# Patient Record
Sex: Female | Born: 1960
Health system: Southern US, Community
[De-identification: ages and names within clinical notes are randomized; demographics above are authoritative.]

## PROBLEM LIST (undated history)

## (undated) DIAGNOSIS — I10 Essential (primary) hypertension: Secondary | ICD-10-CM

## (undated) DIAGNOSIS — M1990 Unspecified osteoarthritis, unspecified site: Secondary | ICD-10-CM

## (undated) DIAGNOSIS — J45909 Unspecified asthma, uncomplicated: Secondary | ICD-10-CM

## (undated) DIAGNOSIS — T7840XA Allergy, unspecified, initial encounter: Secondary | ICD-10-CM

## (undated) DIAGNOSIS — J302 Other seasonal allergic rhinitis: Secondary | ICD-10-CM

## (undated) DIAGNOSIS — E785 Hyperlipidemia, unspecified: Secondary | ICD-10-CM

## (undated) HISTORY — PX: WISDOM TOOTH EXTRACTION: SHX21

## (undated) HISTORY — PX: COLONOSCOPY: SHX174

## (undated) HISTORY — DX: Other seasonal allergic rhinitis: J30.2

## (undated) HISTORY — DX: Hyperlipidemia, unspecified: E78.5

## (undated) HISTORY — DX: Unspecified asthma, uncomplicated: J45.909

## (undated) HISTORY — DX: Allergy, unspecified, initial encounter: T78.40XA

---
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
## 2007-03-09 HISTORY — PX: COLONOSCOPY WITH ESOPHAGOGASTRODUODENOSCOPY (EGD): SHX5779

## 2008-01-29 ENCOUNTER — Emergency Department (HOSPITAL_COMMUNITY): Admission: EM | Admit: 2008-01-29 | Discharge: 2008-01-29 | Payer: Self-pay | Admitting: Emergency Medicine

## 2008-11-04 ENCOUNTER — Emergency Department (HOSPITAL_COMMUNITY): Admission: EM | Admit: 2008-11-04 | Discharge: 2008-11-04 | Payer: Self-pay | Admitting: Emergency Medicine

## 2010-09-30 ENCOUNTER — Emergency Department (HOSPITAL_COMMUNITY): Payer: 59

## 2010-09-30 ENCOUNTER — Observation Stay (HOSPITAL_COMMUNITY)
Admission: EM | Admit: 2010-09-30 | Discharge: 2010-10-01 | Disposition: A | Discharge status: Home / Self Care | Payer: 59 | Attending: Emergency Medicine | Admitting: Emergency Medicine

## 2010-09-30 DIAGNOSIS — R079 Chest pain, unspecified: Principal | ICD-10-CM | POA: Insufficient documentation

## 2010-09-30 LAB — CBC
HCT: 38.5 % (ref 36.0–46.0)
Hemoglobin: 13 g/dL (ref 12.0–15.0)
MCV: 82.4 fL (ref 78.0–100.0)
RBC: 4.67 MIL/uL (ref 3.87–5.11)
RDW: 16.7 % — ABNORMAL HIGH (ref 11.5–15.5)
WBC: 8.2 10*3/uL (ref 4.0–10.5)

## 2010-09-30 LAB — URINALYSIS, ROUTINE W REFLEX MICROSCOPIC
Leukocytes, UA: NEGATIVE
Nitrite: NEGATIVE
Protein, ur: NEGATIVE mg/dL
Specific Gravity, Urine: 1.009 (ref 1.005–1.030)
Urobilinogen, UA: 1 mg/dL (ref 0.0–1.0)

## 2010-09-30 LAB — DIFFERENTIAL
Eosinophils Relative: 8 % — ABNORMAL HIGH (ref 0–5)
Lymphocytes Relative: 28 % (ref 12–46)
Lymphs Abs: 2.3 10*3/uL (ref 0.7–4.0)
Neutro Abs: 4.3 10*3/uL (ref 1.7–7.7)

## 2010-09-30 LAB — POCT CARDIAC MARKERS
CKMB, poc: <1 ng/mL — ABNORMAL LOW (ref 1.0–8.0)
CKMB, poc: <1 ng/mL — ABNORMAL LOW (ref 1.0–8.0)
Troponin i, poc: <0.05 ng/mL (ref 0.00–0.09)
Troponin i, poc: <0.05 ng/mL (ref 0.00–0.09)

## 2010-09-30 LAB — POCT I-STAT, CHEM 8
Chloride: 107 mEq/L (ref 96–112)
Creatinine, Ser: 1.2 mg/dL (ref 0.4–1.2)
Glucose, Bld: 91 mg/dL (ref 70–99)
HCT: 40 % (ref 36.0–46.0)
Potassium: 4.1 mEq/L (ref 3.5–5.1)

## 2010-09-30 LAB — URINE MICROSCOPIC-ADD ON

## 2010-10-01 DIAGNOSIS — R072 Precordial pain: Secondary | ICD-10-CM

## 2010-10-17 LAB — BASIC METABOLIC PANEL
Chloride: 106 mEq/L (ref 96–112)
GFR calc Af Amer: >60 mL/min (ref >60)
GFR calc non Af Amer: >60 mL/min (ref >60)
Potassium: 3.6 mEq/L (ref 3.5–5.1)

## 2010-10-17 LAB — RAPID STREP SCREEN (MED CTR MEBANE ONLY): Streptococcus, Group A Screen (Direct): NEGATIVE

## 2011-04-05 LAB — POCT I-STAT, CHEM 8
BUN: 12
Chloride: 108
HCT: 37
Sodium: 142
TCO2: 27

## 2012-06-22 ENCOUNTER — Emergency Department (HOSPITAL_COMMUNITY): Payer: Self-pay

## 2012-06-22 ENCOUNTER — Encounter (HOSPITAL_COMMUNITY): Payer: Self-pay | Admitting: *Deleted

## 2012-06-22 ENCOUNTER — Emergency Department (HOSPITAL_COMMUNITY)
Admission: EM | Admit: 2012-06-22 | Discharge: 2012-06-22 | Disposition: A | Discharge status: Home / Self Care | Payer: Self-pay | Attending: Emergency Medicine | Admitting: Emergency Medicine

## 2012-06-22 DIAGNOSIS — M5412 Radiculopathy, cervical region: Secondary | ICD-10-CM | POA: Insufficient documentation

## 2012-06-22 DIAGNOSIS — I10 Essential (primary) hypertension: Secondary | ICD-10-CM | POA: Insufficient documentation

## 2012-06-22 DIAGNOSIS — Z79899 Other long term (current) drug therapy: Secondary | ICD-10-CM | POA: Insufficient documentation

## 2012-06-22 DIAGNOSIS — F17200 Nicotine dependence, unspecified, uncomplicated: Secondary | ICD-10-CM | POA: Insufficient documentation

## 2012-06-22 HISTORY — DX: Essential (primary) hypertension: I10

## 2012-06-22 MED ORDER — CYCLOBENZAPRINE HCL 10 MG PO TABS: 10.0000 mg | ORAL_TABLET | Freq: Three times a day (TID) | ORAL | Status: DC | PRN

## 2012-06-22 MED ORDER — PREDNISONE 20 MG PO TABS
60.0000 mg | ORAL_TABLET | Freq: Once | ORAL | Status: AC
  Administered 2012-06-22: 60 mg via ORAL
  Filled 2012-06-22: qty 3

## 2012-06-22 MED ORDER — PREDNISONE 20 MG PO TABS: ORAL_TABLET | ORAL | Status: DC

## 2012-06-22 MED ORDER — TRAMADOL-ACETAMINOPHEN 37.5-325 MG PO TABS: ORAL_TABLET | ORAL | Status: DC

## 2012-06-22 MED ORDER — CYCLOBENZAPRINE HCL 10 MG PO TABS
10.0000 mg | ORAL_TABLET | Freq: Once | ORAL | Status: AC
  Administered 2012-06-22: 10 mg via ORAL
  Filled 2012-06-22: qty 1

## 2012-06-22 NOTE — ED Provider Notes (Signed)
History   This chart was scribed for Ward Givens, MD by Gerlean Ren, ED Scribe. This patient was seen in room TR08C/TR08C and the patient's care was started at 6:32 PM    CSN: [id number]  Arrival date & time 06/22/12  1557   First MD Initiated Contact with Patient 06/22/12 1828      Chief Complaint  Patient presents with  . Arm Pain     The history is provided by the patient. No language interpreter was used.   Dajae Kizer is a 51 y.o. female with h/o HTN who presents to the Emergency Department complaining of shooting right shoulder pain associated with right middle PIP joint "snapping" first noticed 2.5-3 months ago that began causing shooting pain up right forearm 2 weeks later and gradually radiated up to right shoulder.  Pt currently states that there is no pain in finger but that right middle finger occasionally tingles.  Pt denies true numbness in right hand and fingers.  Pt states current pain is in right shoulder and was especially painful today.  She denies any prior injury  Pt has not seen PCP for problem.  Pt is a current everyday smoker but denies alcohol use.   PCP is Dr. Derrell Lolling in [place], [place]  Past Medical History  Diagnosis Date  . Hypertension     Past Surgical History  Procedure Date  . Cesarean section     No family history on file.  History  Substance Use Topics  . Smoking status: Current Every Day Smoker  . Smokeless tobacco: Not on file  . Alcohol Use: No  Lives at home unemployed    No OB history provided.   Review of Systems  HENT: Negative for neck pain.   Skin: Negative for wound.  Neurological: Negative for weakness and numbness.  All other systems reviewed and are negative.    Allergies  Review of patient's allergies indicates no known allergies.  Home Medications   Current Outpatient Rx  Name  Route  Sig  Dispense  Refill  . AMLODIPINE BESYLATE 10 MG PO TABS   Oral   Take 10 mg by mouth daily.         .  CHLORTHALIDONE 25 MG PO TABS   Oral   Take 25 mg by mouth daily.         Marland Kitchen LISINOPRIL 40 MG PO TABS   Oral   Take 40 mg by mouth daily.         . ADULT MULTIVITAMIN W/MINERALS CH   Oral   Take 1 tablet by mouth daily.           BP 127/90  Pulse 81  Temp 98.4 F (36.9 C) (Oral)  Resp 18  SpO2 97%  Vital signs normal    Physical Exam  Nursing note and vitals reviewed. Constitutional: She is oriented to person, place, and time. She appears well-developed and well-nourished.  Non-toxic appearance. She does not appear ill. No distress.  HENT:  Head: Normocephalic and atraumatic.  Right Ear: External ear normal.  Left Ear: External ear normal.  Nose: Nose normal. No mucosal edema or rhinorrhea.  Mouth/Throat: Mucous membranes are normal. No dental abscesses or uvula swelling.  Eyes: Conjunctivae normal and EOM are normal.  Neck: Normal range of motion and full passive range of motion without pain. Neck supple.  Cardiovascular: Exam reveals no friction rub.   Pulmonary/Chest: Effort normal. No respiratory distress. She has no rhonchi. She exhibits no crepitus.  Abdominal: Normal appearance.  Musculoskeletal: Normal range of motion. She exhibits no edema.       Non-tender cervical spine, right trapezius very tender, pain in proximal forearm with dorsiflexion of wrist, no pain with supination and pronation, no joint effusion in right elbow.  Right middle PIP joint appears mildly swollen but has good ROM.   Neurological: She is alert and oriented to person, place, and time. She has normal strength. No cranial nerve deficit.  Skin: Skin is warm, dry and intact. No rash noted. No erythema. No pallor.  Psychiatric: She has a normal mood and affect. Her speech is normal and behavior is normal. Her mood appears not anxious.    ED Course  Procedures (including critical care time)   Medications  traMADol-acetaminophen (ULTRACET) 37.5-325 MG per tablet (not administered)   predniSONE (DELTASONE) tablet 60 mg (60 mg Oral Given 06/22/12 1856)  cyclobenzaprine (FLEXERIL) tablet 10 mg (10 mg Oral Given 06/22/12 1856)    DIAGNOSTIC STUDIES: Oxygen Saturation is 97% on room air, adequate by my interpretation.    COORDINATION OF CARE: 6:41 PM- Patient informed of clinical course, understands medical decision-making process, and agrees with plan.     Dg Cervical Spine Complete  06/22/2012  *RADIOLOGY REPORT*  Clinical Data: Right neck and arm pain for the past 2 months.  No known injury.  CERVICAL SPINE - COMPLETE 4+ VIEW  Comparison: None.  Findings: Mild reversal of the normal cervical lordosis.  Mild facet degenerative changes at multiple levels.  Mild uncinate spur formation at multiple levels with mild foraminal stenosis on the left at the C5-6 level and mild to moderate foraminal stenosis on the left at the C6-7 level.  There is also mild foraminal stenosis on the right at the C4-5, C5-6 and C6-7 levels.  There is anterior and posterior spur formation at the C5-6 and C6-7 levels with moderate disc space narrowing at the C6-7 level and mild retrolisthesis at the level.  IMPRESSION: Degenerative changes, as described above.   Original Report Authenticated By: Beckie Salts, M.D.    Dg Finger Middle Right  06/22/2012  *RADIOLOGY REPORT*  Clinical Data: Right middle finger pain and swelling for the past 2 months.  No reported injury.  RIGHT MIDDLE FINGER 2+V  Comparison: None.  Findings: Three views of the right middle finger demonstrate normal appearing bones and soft tissues.  IMPRESSION: Normal examination.   Original Report Authenticated By: Beckie Salts, M.D.      1. Cervical radiculopathy      New Prescriptions   CYCLOBENZAPRINE (FLEXERIL) 10 MG TABLET    Take 1 tablet (10 mg total) by mouth 3 (three) times daily as needed for muscle spasms.   PREDNISONE (DELTASONE) 20 MG TABLET    Take 3 po QD x 2d starting tomorrow, then 2 po QD x 3d then 1 po QD x 3d    TRAMADOL-ACETAMINOPHEN (ULTRACET) 37.5-325 MG PER TABLET    2 tabs po QID prn pain    Plan discharge  Devoria Albe, MD, FACEP    MDM   I personally performed the services described in this documentation, which was scribed in my presence. The recorded information has been reviewed and considered.         Ward Givens, MD 06/22/12 2113

## 2012-06-22 NOTE — ED Notes (Signed)
MD at bedside. 

## 2012-06-22 NOTE — ED Notes (Signed)
Pt started having right middle finger pain and then radiated up her right forearm.  Pt was wearing this brace and now pain shoots up to shoulder blade

## 2012-08-25 ENCOUNTER — Emergency Department (HOSPITAL_COMMUNITY)
Admission: EM | Admit: 2012-08-25 | Discharge: 2012-08-25 | Disposition: A | Discharge status: Home / Self Care | Payer: Self-pay | Source: Home / Self Care | Attending: Family Medicine | Admitting: Family Medicine

## 2012-08-25 ENCOUNTER — Encounter (HOSPITAL_COMMUNITY): Payer: Self-pay

## 2012-08-25 DIAGNOSIS — F17200 Nicotine dependence, unspecified, uncomplicated: Secondary | ICD-10-CM

## 2012-08-25 DIAGNOSIS — I10 Essential (primary) hypertension: Secondary | ICD-10-CM

## 2012-08-25 MED ORDER — AMLODIPINE BESYLATE 10 MG PO TABS: 10.0000 mg | ORAL_TABLET | Freq: Every day | ORAL | Status: DC

## 2012-08-25 MED ORDER — CHLORTHALIDONE 25 MG PO TABS: 25.0000 mg | ORAL_TABLET | Freq: Every day | ORAL | Status: DC

## 2012-08-25 MED ORDER — CLONIDINE HCL 0.1 MG PO TABS
0.2000 mg | ORAL_TABLET | Freq: Once | ORAL | Status: AC
  Administered 2012-08-25: 0.2 mg via ORAL

## 2012-08-25 MED ORDER — LISINOPRIL 40 MG PO TABS: 40.0000 mg | ORAL_TABLET | Freq: Every day | ORAL | Status: DC

## 2012-08-25 MED ORDER — CLONIDINE HCL 0.1 MG PO TABS
ORAL_TABLET | ORAL | Status: AC
  Filled 2012-08-25: qty 2

## 2012-08-25 NOTE — ED Provider Notes (Signed)
History     CSN: [id number]  Arrival date & time 08/25/12  1138   First MD Initiated Contact with Patient 08/25/12 1201      Chief Complaint  Patient presents with  . Hypertension  . Arthritis   HPI Pt says that she has been out of her BP meds for over 1 week.  She is reporting that she sees a family doctor in [place] and had blood work done recently.  She is reporting that she needs to have her regular BP meds filled and it has been difficult for her to go to her PCP in [facility] because of not having medical insurance and because of the distance with traveling.  The patient denies chest pain and shortness of breath.  The patient denies headaches and weakness in the extremities.  She reports that her blood pressures are usually controlled when she is able to take the medication regularly.  Past Medical History  Diagnosis Date  . Hypertension     Past Surgical History  Procedure Laterality Date  . Cesarean section      No family history on file.  History  Substance Use Topics  . Smoking status: Current Every Day Smoker  . Smokeless tobacco: Not on file  . Alcohol Use: No    OB History   Grav Para Term Preterm Abortions TAB SAB Ect Mult Living                  Review of Systems  Constitutional: Positive for fatigue.  HENT: Positive for congestion.   Musculoskeletal: Positive for arthralgias.  Psychiatric/Behavioral: Positive for sleep disturbance.  All other systems reviewed and are negative.    Allergies  Review of patient's allergies indicates no known allergies.  Home Medications   Current Outpatient Rx  Name  Route  Sig  Dispense  Refill  . amLODipine (NORVASC) 10 MG tablet   Oral   Take 10 mg by mouth daily.         . chlorthalidone (HYGROTON) 25 MG tablet   Oral   Take 25 mg by mouth daily.         . cyclobenzaprine (FLEXERIL) 10 MG tablet   Oral   Take 1 tablet (10 mg total) by mouth 3 (three) times daily as needed for muscle spasms.  30 tablet   0   . lisinopril (PRINIVIL,ZESTRIL) 40 MG tablet   Oral   Take 40 mg by mouth daily.         . Multiple Vitamin (MULTIVITAMIN WITH MINERALS) TABS   Oral   Take 1 tablet by mouth daily.         . predniSONE (DELTASONE) 20 MG tablet      Take 3 po QD x 2d starting tomorrow, then 2 po QD x 3d then 1 po QD x 3d   15 tablet   0   . traMADol-acetaminophen (ULTRACET) 37.5-325 MG per tablet      2 tabs po QID prn pain   20 tablet   0     BP 195/101  Pulse 92  Temp(Src) 98.3 F (36.8 C) (Oral)  Physical Exam  Nursing note and vitals reviewed. Constitutional: She is oriented to person, place, and time. She appears well-developed and well-nourished. No distress.  HENT:  Head: Normocephalic and atraumatic.  Right Ear: External ear normal.  Left Ear: External ear normal.  Nose: Mucosal edema present.  Eyes: Conjunctivae and EOM are normal. Pupils are equal, round, and reactive to light.  Neck: Normal range of motion. Neck supple. No JVD present. No tracheal deviation present. No thyromegaly present.  Cardiovascular: Normal rate, regular rhythm and normal heart sounds.  Exam reveals no gallop and no friction rub.   No murmur heard. Pulmonary/Chest: Effort normal and breath sounds normal. No respiratory distress. She has no wheezes. She has no rales. She exhibits no tenderness.  Abdominal: Soft. Bowel sounds are normal. She exhibits no distension and no mass. There is no tenderness. There is no rebound and no guarding.  Musculoskeletal: Normal range of motion. She exhibits no edema and no tenderness.  Lymphadenopathy:    She has no cervical adenopathy.  Neurological: She is alert and oriented to person, place, and time. No cranial nerve deficit. She exhibits normal muscle tone. Coordination normal.  Skin: Skin is warm and dry. No rash noted. No erythema. No pallor.  Psychiatric: She has a normal mood and affect. Her behavior is normal. Judgment and thought content  normal.    ED Course  Procedures (including critical care time)  Labs Reviewed - No data to display No results found.   No diagnosis found.    MDM  IMPRESSION  Hypertension  Low back pain  Osteoarthritis    RECOMMENDATIONS / PLAN Clonidine 0.2 mg po given in office, Bp was rechecked and starting to improve significantly Prescriptions were refilled and sent electronically to [facility] as pt requested Pt says she would go to get her prescriptions filled today Pt says that she would return to have her BP rechecked in 2 weeks Pt says she had labs done with her PCP in [place] recently - will request to get those records Pt was given instructions to go to ER if her symptoms returned or worsened or new problems developed Pt will call to get a referral to sports medicine regarding her lower back and shoulder as she was not able to follow up with orthopedics when she was seen in ER because of not having insurance and not having money to pay upfront for an office visit. Dental referral when patient gets her orange card Check labs on follow up if we have not received old records DASH Diet information given and low sodium recommended  FOLLOW UP 2 weeks to follow up BP  The patient was given clear instructions to go to ER or return to medical center if symptoms don't improve, worsen or new problems develop.  The patient verbalized understanding.  The patient was told to call to get lab results if they haven't heard anything in the next week.            Cleora Fleet, MD 08/25/12 1454

## 2012-08-25 NOTE — ED Notes (Signed)
Patient states has a history of HTN , arthritis Complains last two days has swelling in face, hands and ankles

## 2012-08-26 MED ORDER — CHLORTHALIDONE 25 MG PO TABS: 25.0000 mg | ORAL_TABLET | Freq: Every day | ORAL | Status: DC

## 2012-08-26 MED ORDER — LISINOPRIL 40 MG PO TABS: 40.0000 mg | ORAL_TABLET | Freq: Every day | ORAL | Status: DC

## 2012-08-26 MED ORDER — AMLODIPINE BESYLATE 10 MG PO TABS: 10.0000 mg | ORAL_TABLET | Freq: Every day | ORAL | Status: DC

## 2012-09-09 ENCOUNTER — Emergency Department (HOSPITAL_COMMUNITY)
Admission: EM | Admit: 2012-09-09 | Discharge: 2012-09-09 | Disposition: A | Discharge status: Home / Self Care | Payer: No Typology Code available for payment source | Source: Home / Self Care

## 2012-09-09 ENCOUNTER — Encounter (HOSPITAL_COMMUNITY): Payer: Self-pay | Admitting: *Deleted

## 2012-09-09 DIAGNOSIS — I10 Essential (primary) hypertension: Secondary | ICD-10-CM

## 2012-09-09 MED ORDER — LISINOPRIL 40 MG PO TABS: 40.0000 mg | ORAL_TABLET | Freq: Every day | ORAL | Status: DC

## 2012-09-09 MED ORDER — TRAMADOL-ACETAMINOPHEN 37.5-325 MG PO TABS: ORAL_TABLET | ORAL | Status: DC

## 2012-09-09 MED ORDER — CYCLOBENZAPRINE HCL 10 MG PO TABS: 10.0000 mg | ORAL_TABLET | Freq: Three times a day (TID) | ORAL | Status: DC | PRN

## 2012-09-09 MED ORDER — CHLORTHALIDONE 25 MG PO TABS: 25.0000 mg | ORAL_TABLET | Freq: Every day | ORAL | Status: DC

## 2012-09-09 MED ORDER — ADULT MULTIVITAMIN W/MINERALS CH: 1.0000 | ORAL_TABLET | Freq: Every day | ORAL | Status: DC

## 2012-09-09 MED ORDER — AMLODIPINE BESYLATE 10 MG PO TABS: 10.0000 mg | ORAL_TABLET | Freq: Every day | ORAL | Status: DC

## 2012-09-09 MED ORDER — METOPROLOL TARTRATE 50 MG PO TABS: 25.0000 mg | ORAL_TABLET | Freq: Two times a day (BID) | ORAL | Status: DC

## 2012-09-09 NOTE — ED Notes (Signed)
Patient states she is here for follow up to hypertension and states she is also here for new problem of back pian due to arthritis.

## 2012-09-09 NOTE — ED Provider Notes (Signed)
History     CSN: [id number]  Arrival date & time 09/09/12  1010   First MD Initiated Contact with Patient 09/09/12 1012      Chief Complaint  Patient presents with  . Follow-up  . Arthritis    Patient with history of hypertension and chronic neck pain due to C-spine DJD comes to the office for blood pressure monitoring along with referral to neurosurgeon if possible. She says that she has this chronic pain in her C-spine radiating down to her right shoulder which is mild to moderate in nature and persistent, no associated symptoms worse with activity better with rest no weakness in the right arm. No fever chills no injuries to the C-spine. A C-spine x-ray done in the ER few weeks ago does reveal C-spine DJD with some C-spine stenosis between C6 and C7.   HPI  Past Medical History  Diagnosis Date  . Hypertension     Past Surgical History  Procedure Laterality Date  . Cesarean section      No family history on file.  History  Substance Use Topics  . Smoking status: Current Every Day Smoker  . Smokeless tobacco: Not on file  . Alcohol Use: No    OB History   Grav Para Term Preterm Abortions TAB SAB Ect Mult Living                  Review of Systems  Allergies  Review of patient's allergies indicates no known allergies.  Home Medications   Current Outpatient Rx  Name  Route  Sig  Dispense  Refill  . amLODipine (NORVASC) 10 MG tablet   Oral   Take 1 tablet (10 mg total) by mouth daily.   30 tablet   2   . chlorthalidone (HYGROTON) 25 MG tablet   Oral   Take 1 tablet (25 mg total) by mouth daily.   30 tablet   2   . cyclobenzaprine (FLEXERIL) 10 MG tablet   Oral   Take 1 tablet (10 mg total) by mouth 3 (three) times daily as needed for muscle spasms.   30 tablet   0   . lisinopril (PRINIVIL,ZESTRIL) 40 MG tablet   Oral   Take 1 tablet (40 mg total) by mouth daily.   30 tablet   2   . metoprolol (LOPRESSOR) 50 MG tablet   Oral   Take 0.5 tablets  (25 mg total) by mouth 2 (two) times daily.   60 tablet   1   . Multiple Vitamin (MULTIVITAMIN WITH MINERALS) TABS   Oral   Take 1 tablet by mouth daily.   30 tablet   3   . predniSONE (DELTASONE) 20 MG tablet      Take 3 po QD x 2d starting tomorrow, then 2 po QD x 3d then 1 po QD x 3d   15 tablet   0   . traMADol-acetaminophen (ULTRACET) 37.5-325 MG per tablet      2 tabs po QID prn pain   20 tablet   0     BP 144/96  Pulse 85  Temp(Src) 98.1 F (36.7 C) (Oral)  Resp 14  SpO2 98%  Physical Exam  Awake alert C-spine range of motion is good, no pain or tenderness on palpation, no stepping deformity, right shoulder range of motion both active and passive pain-free. Good range of motion. Good right arm strength 5 x 5. Intact sensation which is symmetrical in both arms.  Chest wall movement  symmetrical breath sounds bilaterally S1-S2 no murmurs  ED Course  Procedures (including critical care time)  Labs Reviewed - No data to display No results found.   1. HTN (hypertension)    Blood pressure noted, previous medications will be continued Will add low-dose Lopressor better control and have her come back in a month.   2. Chronic neck pain due to mild degenerative disc disease in C-spine along with some C6-C7 spinal narrowing of/stenosis - continue tramadol, will refer her to neurosurgeon to see if any further interventions needed, although I highly doubt that she would benefit from any surgical procedure at this point          Leroy Sea, MD 09/09/12 1052

## 2012-09-11 NOTE — ED Notes (Signed)
Referral faxed to ophthamology waiting for an appt

## 2013-02-09 ENCOUNTER — Ambulatory Visit: Payer: No Typology Code available for payment source | Attending: Family Medicine | Admitting: Internal Medicine

## 2013-02-09 VITALS — BP 199/123 | HR 66 | Temp 97.8°F | Resp 18 | Wt 132.2 lb

## 2013-02-09 DIAGNOSIS — M2550 Pain in unspecified joint: Secondary | ICD-10-CM

## 2013-02-09 DIAGNOSIS — I10 Essential (primary) hypertension: Secondary | ICD-10-CM

## 2013-02-09 LAB — CBC WITH DIFFERENTIAL/PLATELET
Basophils Absolute: 0.1 10*3/uL (ref 0.0–0.1)
Eosinophils Relative: 7 % — ABNORMAL HIGH (ref 0–5)
Lymphocytes Relative: 24 % (ref 12–46)
Lymphs Abs: 2.1 10*3/uL (ref 0.7–4.0)
MCV: 85.4 fL (ref 78.0–100.0)
Neutro Abs: 5.2 10*3/uL (ref 1.7–7.7)
Platelets: 260 10*3/uL (ref 150–400)
RBC: 5.14 MIL/uL — ABNORMAL HIGH (ref 3.87–5.11)
RDW: 13 % (ref 11.5–15.5)
WBC: 8.7 10*3/uL (ref 4.0–10.5)

## 2013-02-09 MED ORDER — TRAMADOL-ACETAMINOPHEN 37.5-325 MG PO TABS: ORAL_TABLET | ORAL | Status: DC

## 2013-02-09 MED ORDER — CLONIDINE HCL 0.1 MG PO TABS
0.2000 mg | ORAL_TABLET | Freq: Once | ORAL | Status: AC
  Administered 2013-02-09: 0.2 mg via ORAL

## 2013-02-09 MED ORDER — CHLORTHALIDONE 25 MG PO TABS: 25.0000 mg | ORAL_TABLET | Freq: Every day | ORAL | Status: DC

## 2013-02-09 MED ORDER — CYCLOBENZAPRINE HCL 10 MG PO TABS: 10.0000 mg | ORAL_TABLET | Freq: Three times a day (TID) | ORAL | Status: DC | PRN

## 2013-02-09 MED ORDER — LISINOPRIL 40 MG PO TABS: 40.0000 mg | ORAL_TABLET | Freq: Every day | ORAL | Status: DC

## 2013-02-09 MED ORDER — METOPROLOL TARTRATE 50 MG PO TABS: 25.0000 mg | ORAL_TABLET | Freq: Two times a day (BID) | ORAL | Status: DC

## 2013-02-09 MED ORDER — AMLODIPINE BESYLATE 10 MG PO TABS: 10.0000 mg | ORAL_TABLET | Freq: Every day | ORAL | Status: DC

## 2013-02-09 MED ORDER — CLONIDINE HCL 0.2 MG PO TABS: 0.2000 mg | ORAL_TABLET | Freq: Once | ORAL | Status: DC

## 2013-02-09 NOTE — Progress Notes (Signed)
BLOOD PRESSURE RECHECK 199/123 POST MEDICATION. DR. Jerilynn Birkenhead.

## 2013-02-09 NOTE — Progress Notes (Signed)
Patient ID: Lori Bush, female   DOB: [birth year], 52 y.o.   MRN: [id number]  CC:  HPI: 52 year old female with a history of hypertension who is here for followup her blood pressures greater than 200 systolic. The patient states that she has not been taking her lisinopril for the last 2-3 days. She was asked to come in and have blood work done before he felt could be prescribed. The patient complains of morning stiffness in her arms and in her hands for at least one hour when she wakes up. The patient also has chronic C-spine pain radiating into her right shoulder. She has a strong family history of rheumatoid arthritis  No Known Allergies Past Medical History  Diagnosis Date  . Hypertension    Current Outpatient Prescriptions on File Prior to Visit  Medication Sig Dispense Refill  . Multiple Vitamin (MULTIVITAMIN WITH MINERALS) TABS Take 1 tablet by mouth daily.  30 tablet  3  . predniSONE (DELTASONE) 20 MG tablet Take 3 po QD x 2d starting tomorrow, then 2 po QD x 3d then 1 po QD x 3d  15 tablet  0   No current facility-administered medications on file prior to visit.   History reviewed. No pertinent family history. History   Social History  . Marital Status: Single    Spouse Name: N/A    Number of Children: N/A  . Years of Education: N/A   Occupational History  . Not on file.   Social History Main Topics  . Smoking status: Current Every Day Smoker  . Smokeless tobacco: Not on file  . Alcohol Use: No  . Drug Use: No  . Sexually Active:    Other Topics Concern  . Not on file   Social History Narrative  . No narrative on file    Review of Systems  Constitutional: Negative for fever, chills, diaphoresis, activity change, appetite change and fatigue.  HENT: Negative for ear pain, nosebleeds, congestion, facial swelling, rhinorrhea, neck pain, neck stiffness and ear discharge.   Eyes: Negative for pain, discharge, redness, itching and visual disturbance.   Respiratory: Negative for cough, choking, chest tightness, shortness of breath, wheezing and stridor.   Cardiovascular: Negative for chest pain, palpitations and leg swelling.  Gastrointestinal: Negative for abdominal distention.  Genitourinary: Negative for dysuria, urgency, frequency, hematuria, flank pain, decreased urine volume, difficulty urinating and dyspareunia.  Musculoskeletal: Negative for back pain, joint swelling, arthralgias and gait problem.  Neurological: Negative for dizziness, tremors, seizures, syncope, facial asymmetry, speech difficulty, weakness, light-headedness, numbness and headaches.  Hematological: Negative for adenopathy. Does not bruise/bleed easily.  Psychiatric/Behavioral: Negative for hallucinations, behavioral problems, confusion, dysphoric mood, decreased concentration and agitation.    Objective:   Filed Vitals:   02/09/13 1654  BP: 213/119  Pulse: 66  Temp: 97.8 F (36.6 C)  Resp: 18    Physical Exam  Constitutional: Appears well-developed and well-nourished. No distress.  HENT: Normocephalic. External right and left ear normal. Oropharynx is clear and moist.  Eyes: Conjunctivae and EOM are normal. PERRLA, no scleral icterus.  Neck: Normal ROM. Neck supple. No JVD. No tracheal deviation. No thyromegaly.  CVS: RRR, S1/S2 +, no murmurs, no gallops, no carotid bruit.  Pulmonary: Effort and breath sounds normal, no stridor, rhonchi, wheezes, rales.  Abdominal: Soft. BS +,  no distension, tenderness, rebound or guarding.  Musculoskeletal: Normal range of motion. No edema and no tenderness.  Lymphadenopathy: No lymphadenopathy noted, cervical, inguinal. Neuro: Alert. Normal reflexes, muscle tone coordination. No cranial nerve  deficit. Skin: Skin is warm and dry. No rash noted. Not diaphoretic. No erythema. No pallor.  Psychiatric: Normal mood and affect. Behavior, judgment, thought content normal.   Lab Results  Component Value Date   WBC 8.2  09/30/2010   HGB 13.6 09/30/2010   HCT 40.0 09/30/2010   MCV 82.4 09/30/2010   PLT 318 09/30/2010   Lab Results  Component Value Date   CREATININE 1.2 09/30/2010   BUN 22 09/30/2010   NA 139 09/30/2010   K 4.1 09/30/2010   CL 107 09/30/2010   CO2 25 11/04/2008    No results found for this basename: HGBA1C   Lipid Panel  No results found for this basename: chol, trig, hdl, cholhdl, vldl, ldlcalc       Assessment and plan:   There are no active problems to display for this patient.  Hypertension Refill all medications Obtain BMP panel today Patient asked to followup in 3 days for a blood pressure check  Probable rheumatoid arthritis with a strong family history we'll check rheumatoid factor and anti-CCP antibody refill tramadol

## 2013-02-09 NOTE — Progress Notes (Signed)
PT GIVEN 0.2 MG CLONIDINE PO FOR ELEVATED BP. WILL  RECHECK IN 25 MIN

## 2013-02-09 NOTE — Progress Notes (Signed)
Patient states here for medication refill Also complains of her hands cramping up

## 2013-02-10 LAB — SEDIMENTATION RATE: Sed Rate: 4 mm/hr (ref 0–22)

## 2013-02-10 LAB — COMPREHENSIVE METABOLIC PANEL
ALT: 26 U/L (ref 0–35)
AST: 27 U/L (ref 0–37)
Albumin: 3.9 g/dL (ref 3.5–5.2)
CO2: 26 mEq/L (ref 19–32)
Calcium: 10.6 mg/dL — ABNORMAL HIGH (ref 8.4–10.5)
Chloride: 109 mEq/L (ref 96–112)
Potassium: 3.9 mEq/L (ref 3.5–5.3)
Sodium: 141 mEq/L (ref 135–145)
Total Protein: 6.6 g/dL (ref 6.0–8.3)

## 2013-02-10 LAB — LIPID PANEL
Cholesterol: 160 mg/dL (ref 0–200)
Total CHOL/HDL Ratio: 3.1 Ratio

## 2013-02-10 LAB — RHEUMATOID FACTOR: Rhuematoid fact SerPl-aCnc: <10 IU/mL (ref <=14)

## 2013-02-11 ENCOUNTER — Telehealth: Payer: Self-pay

## 2013-02-11 NOTE — Telephone Encounter (Signed)
Health dept called requesting some medication changes Changed ultracet to tramadol 50mg  Q6hour prm Changed chlorthaldone to  HCTZ 25mg  daily Metoprolol- 25 mg bid # 60 Per Dr Hyman Hopes

## 2013-02-12 ENCOUNTER — Ambulatory Visit: Payer: No Typology Code available for payment source | Admitting: Family Medicine

## 2013-02-23 ENCOUNTER — Ambulatory Visit: Payer: No Typology Code available for payment source

## 2013-03-05 ENCOUNTER — Ambulatory Visit: Payer: Self-pay | Attending: Family Medicine

## 2013-03-05 ENCOUNTER — Ambulatory Visit (HOSPITAL_BASED_OUTPATIENT_CLINIC_OR_DEPARTMENT_OTHER): Payer: Self-pay | Admitting: Internal Medicine

## 2013-03-05 VITALS — BP 164/92 | HR 70 | Temp 97.8°F | Resp 16 | Wt 130.0 lb

## 2013-03-05 DIAGNOSIS — I10 Essential (primary) hypertension: Secondary | ICD-10-CM | POA: Insufficient documentation

## 2013-03-05 MED ORDER — CLONIDINE HCL 0.2 MG PO TABS: 0.2000 mg | ORAL_TABLET | Freq: Two times a day (BID) | ORAL | Status: DC

## 2013-03-05 MED ORDER — METOPROLOL TARTRATE 50 MG PO TABS
50.0000 mg | ORAL_TABLET | Freq: Two times a day (BID) | ORAL | Status: DC
Start: 2013-03-05 — End: 2013-09-30

## 2013-03-05 NOTE — Progress Notes (Signed)
Patient ID: Lori Bush, female   DOB: Nov 29, 1960, 52 y.o.   MRN: [id number]  CC: Followup  HPI: Ms. Lori Bush is a 52 years old of an American woman here today for followup visit and to discuss the lab results. Blood pressure is elevated, then claims to be compliant with medication, apparently she is taking 0.2 mg clonidine once daily, and Lopressor 25 mg tablet twice a day. She is continue to smoke cigarette about one pack per day. Lab results were discussed, investigations negative for rheumatoid arthritis. Patient claims to still have some trigger fingers, especially the left thumb.  No leg swelling, no chest pain, denies headache, no tingling or numbness, no blurry vision.  No Known Allergies Past Medical History  Diagnosis Date  . Hypertension    Current Outpatient Prescriptions on File Prior to Visit  Medication Sig Dispense Refill  . amLODipine (NORVASC) 10 MG tablet Take 1 tablet (10 mg total) by mouth daily.  30 tablet  2  . chlorthalidone (HYGROTON) 25 MG tablet Take 1 tablet (25 mg total) by mouth daily.  30 tablet  2  . cyclobenzaprine (FLEXERIL) 10 MG tablet Take 1 tablet (10 mg total) by mouth 3 (three) times daily as needed for muscle spasms.  30 tablet  0  . hydrochlorothiazide (HYDRODIURIL) 25 MG tablet Take 25 mg by mouth daily.      Marland Kitchen lisinopril (PRINIVIL,ZESTRIL) 40 MG tablet Take 1 tablet (40 mg total) by mouth daily.  30 tablet  2  . Multiple Vitamin (MULTIVITAMIN WITH MINERALS) TABS Take 1 tablet by mouth daily.  30 tablet  3  . traMADol-acetaminophen (ULTRACET) 37.5-325 MG per tablet 2 tabs po QID prn pain  30 tablet  0   No current facility-administered medications on file prior to visit.   History reviewed. No pertinent family history. History   Social History  . Marital Status: Single    Spouse Name: N/A    Number of Children: N/A  . Years of Education: N/A   Occupational History  . Not on file.   Social History Main Topics  . Smoking status:  Current Every Day Smoker  . Smokeless tobacco: Not on file  . Alcohol Use: No  . Drug Use: No  . Sexual Activity:    Other Topics Concern  . Not on file   Social History Narrative  . No narrative on file    Review of Systems: Constitutional: Negative for fever, chills, diaphoresis, activity change, appetite change and fatigue. HENT: Negative for ear pain, nosebleeds, congestion, facial swelling, rhinorrhea, neck pain, neck stiffness and ear discharge.  Eyes: Negative for pain, discharge, redness, itching and visual disturbance. Respiratory: Negative for cough, choking, chest tightness, shortness of breath, wheezing and stridor.  Cardiovascular: Negative for chest pain, palpitations and leg swelling. Gastrointestinal: Negative for abdominal distention. Genitourinary: Negative for dysuria, urgency, frequency, hematuria, flank pain, decreased urine volume, difficulty urinating and dyspareunia.  Musculoskeletal: Negative for back pain, joint swelling, arthralgias and gait problem. Neurological: Negative for dizziness, tremors, seizures, syncope, facial asymmetry, speech difficulty, weakness, light-headedness, numbness and headaches.  Hematological: Negative for adenopathy. Does not bruise/bleed easily. Psychiatric/Behavioral: Negative for hallucinations, behavioral problems, confusion, dysphoric mood, decreased concentration and agitation.    Objective:   Filed Vitals:   03/05/13 1521  BP: 164/92  Pulse: 70  Temp:   Resp:     Physical Exam: Constitutional: Patient appears well-developed and well-nourished. No distress. HENT: Normocephalic, atraumatic, External right and left ear normal. Oropharynx is clear and moist.  Eyes: Conjunctivae and EOM are normal. PERRLA, no scleral icterus. Neck: Normal ROM. Neck supple. No JVD. No tracheal deviation. No thyromegaly. CVS: RRR, S1/S2 +, no murmurs, no gallops, no carotid bruit.  Pulmonary: Effort and breath sounds normal, no stridor,  rhonchi, wheezes, rales.  Abdominal: Soft. BS +,  no distension, tenderness, rebound or guarding.  Musculoskeletal: Normal range of motion. No edema and no tenderness.  Lymphadenopathy: No lymphadenopathy noted, cervical, inguinal or axillary Neuro: Alert. Normal reflexes, muscle tone coordination. No cranial nerve deficit. Skin: Skin is warm and dry. No rash noted. Not diaphoretic. No erythema. No pallor. Psychiatric: Normal mood and affect. Behavior, judgment, thought content normal.  Lab Results  Component Value Date   WBC 8.7 02/09/2013   HGB 15.2* 02/09/2013   HCT 43.9 02/09/2013   MCV 85.4 02/09/2013   PLT 260 02/09/2013   Lab Results  Component Value Date   CREATININE 1.02 02/09/2013   BUN 12 02/09/2013   NA 141 02/09/2013   K 3.9 02/09/2013   CL 109 02/09/2013   CO2 26 02/09/2013    No results found for this basename: HGBA1C   Lipid Panel     Component Value Date/Time   CHOL 160 02/09/2013 1719   TRIG 131 02/09/2013 1719   HDL 51 02/09/2013 1719   CHOLHDL 3.1 02/09/2013 1719   VLDL 26 02/09/2013 1719   LDLCALC 83 02/09/2013 1719       Assessment and plan:   Patient Active Problem List   Diagnosis Date Noted  . Accelerated hypertension 03/05/2013   Increase metoprolol to 50 mg tablet by mouth twice a day Increase clonidine tablet 0.2 mg by mouth twice a day Continue other medications as prescribed Patient counseled on blood pressure goals Extensive counseling on smoking cessation done Patient was counseled on medication compliance Nutritional and exercise counseling Return in one week for blood pressure check  Lori Bush was given clear instructions to go to ER or return to the clinic if symptoms don't improve, worsen or new problems develop.  Lori Bush verbalized understanding.  Lori Bush was told to call to get lab results if hasn't heard anything in the next week.        Jeanann Lewandowsky, MD [facility] [place], [place] [phone]   03/05/2013, 3:23 PM

## 2013-03-05 NOTE — Progress Notes (Signed)
Patient here for follow up- blood pressure Lab results  

## 2013-03-12 ENCOUNTER — Ambulatory Visit: Payer: Self-pay | Attending: Family Medicine | Admitting: Family Medicine

## 2013-05-12 ENCOUNTER — Ambulatory Visit: Payer: No Typology Code available for payment source | Attending: Internal Medicine | Admitting: Internal Medicine

## 2013-05-12 VITALS — BP 147/91 | HR 61 | Temp 98.0°F | Resp 16 | Ht 60.0 in | Wt 130.0 lb

## 2013-05-12 DIAGNOSIS — I10 Essential (primary) hypertension: Secondary | ICD-10-CM

## 2013-05-12 DIAGNOSIS — Z0000 Encounter for general adult medical examination without abnormal findings: Secondary | ICD-10-CM

## 2013-05-12 DIAGNOSIS — R682 Dry mouth, unspecified: Secondary | ICD-10-CM

## 2013-05-12 MED ORDER — CHLORHEXIDINE GLUCONATE 0.12 % MT SOLN: 15.0000 mL | Freq: Two times a day (BID) | OROMUCOSAL | Status: DC

## 2013-05-12 NOTE — Progress Notes (Unsigned)
Pt is here for a F/U visit Pt is here following up on her HTN. Pt is requesting a referral for a MM and a pap smear.

## 2013-05-12 NOTE — Progress Notes (Unsigned)
Patient ID: Lori Bush, female   DOB: 31-Oct-1960, 52 y.o.   MRN: [id number]   HPI: 52 y/o female here for f/u on HTN and mammogram.  States Clonidine makes her sleepy and she is taking it only at night.  Mouth has been dry and she is asking for a treatment for it.    No Known Allergies Past Medical History  Diagnosis Date  . Hypertension     History reviewed. No pertinent family history. History   Social History  . Marital Status: Single    Spouse Name: N/A    Number of Children: N/A  . Years of Education: N/A   Occupational History  . Not on file.   Social History Main Topics  . Smoking status: Current Every Day Smoker  . Smokeless tobacco: Not on file  . Alcohol Use: No  . Drug Use: No  . Sexual Activity:    Other Topics Concern  . Not on file   Social History Narrative  . No narrative on file    Review of Systems ______ Constitutional: Negative for fever, chills, diaphoresis, activity change, appetite change and fatigue. ____ HENT: Negative for ear pain, nosebleeds, congestion, facial swelling, rhinorrhea, neck pain, neck stiffness and ear discharge.  ____ Eyes: Negative for pain, discharge, redness, itching and visual disturbance. ____ Respiratory: Negative for cough, choking, chest tightness, shortness of breath, wheezing and stridor.  ____ Cardiovascular: Negative for chest pain, palpitations and leg swelling. ____ Gastrointestinal: Negative for Nausea/ Vomiting/ Diarrhea or Consitpation Genitourinary: Negative for dysuria, urgency, frequency, hematuria, flank pain, decreased urine volume, difficulty urinating and dyspareunia. ____ Musculoskeletal: Negative for back pain, joint swelling, arthralgias and gait problem. ________ Neurological: Negative for dizziness, tremors, seizures, syncope, facial asymmetry, speech difficulty, weakness, light-headedness, numbness and headaches. ____ Hematological: Negative for adenopathy. Does not bruise/bleed easily.  ____ Psychiatric/Behavioral: Negative for hallucinations, behavioral problems, confusion, dysphoric mood, decreased concentration and agitation. ______   Objective:   Filed Vitals:   05/12/13 1057  BP: 147/91  Pulse: 61  Temp: 98 F (36.7 C)  Resp: 16   Filed Weights   05/12/13 1057  Weight: 130 lb (58.968 kg)    Physical Exam ______ Constitutional: Appears well-developed and well-nourished. No distress. ____ HENT: Normocephalic. External right and left ear normal. Oropharynx is clear and moist. ____ Eyes: Conjunctivae and EOM are normal. PERRLA, no scleral icterus. ____ Neck: Normal ROM. Neck supple. No JVD. No tracheal deviation. No thyromegaly. ____ CVS: RRR, S1/S2 +, no murmurs, no gallops, no carotid bruit.  Pulmonary: Effort and breath sounds normal, no stridor, rhonchi, wheezes, rales.  Abdominal: Soft. BS +,  no distension, tenderness, rebound or guarding. ________ Musculoskeletal: Normal range of motion. No edema and no tenderness. ____ Neuro: Alert. Normal reflexes, muscle tone coordination. No cranial nerve deficit. Skin: Skin is warm and dry. No rash noted. Not diaphoretic. No erythema. No pallor. ____ Psychiatric: Normal mood and affect. Behavior, judgment, thought content normal. __  Lab Results  Component Value Date   WBC 8.7 02/09/2013   HGB 15.2* 02/09/2013   HCT 43.9 02/09/2013   MCV 85.4 02/09/2013   PLT 260 02/09/2013   Lab Results  Component Value Date   CREATININE 1.02 02/09/2013   BUN 12 02/09/2013   NA 141 02/09/2013   K 3.9 02/09/2013   CL 109 02/09/2013   CO2 26 02/09/2013    No results found for this basename: HGBA1C   Lipid Panel     Component Value Date/Time   CHOL  160 02/09/2013 1719   TRIG 131 02/09/2013 1719   HDL 51 02/09/2013 1719   CHOLHDL 3.1 02/09/2013 1719   VLDL 26 02/09/2013 1719   LDLCALC 83 02/09/2013 1719        Patient Active Problem List   Diagnosis Date Noted  . Accelerated hypertension 03/05/2013     Preventative Medicine:    Mammogram: ordered Colonoscopy : completed 4 yrs ago Pap Smear : next visit Flu vaccine: given    Assessment and plan:  HTN - only taking clonidine daily rather than BID because it makes her sleepy - BP reasonable controlled today  Dry mouth -peridex mouth wash    Calvert Cantor, MD

## 2013-05-27 ENCOUNTER — Other Ambulatory Visit: Payer: Self-pay | Admitting: Internal Medicine

## 2013-05-27 DIAGNOSIS — Z1231 Encounter for screening mammogram for malignant neoplasm of breast: Secondary | ICD-10-CM

## 2013-06-08 ENCOUNTER — Encounter: Payer: No Typology Code available for payment source | Admitting: Internal Medicine

## 2013-06-11 ENCOUNTER — Other Ambulatory Visit: Payer: Self-pay | Admitting: Emergency Medicine

## 2013-06-11 MED ORDER — AMLODIPINE BESYLATE 10 MG PO TABS: 10.0000 mg | ORAL_TABLET | Freq: Every day | ORAL | Status: DC

## 2013-06-11 MED ORDER — LISINOPRIL 40 MG PO TABS: 40.0000 mg | ORAL_TABLET | Freq: Every day | ORAL | Status: DC

## 2013-06-17 ENCOUNTER — Ambulatory Visit (HOSPITAL_COMMUNITY)
Admission: RE | Admit: 2013-06-17 | Discharge: 2013-06-17 | Disposition: A | Discharge status: Home / Self Care | Payer: No Typology Code available for payment source | Source: Ambulatory Visit | Attending: Internal Medicine | Admitting: Internal Medicine

## 2013-06-17 DIAGNOSIS — Z1231 Encounter for screening mammogram for malignant neoplasm of breast: Secondary | ICD-10-CM | POA: Insufficient documentation

## 2013-09-30 ENCOUNTER — Ambulatory Visit: Payer: Self-pay | Attending: Family Medicine | Admitting: Family Medicine

## 2013-09-30 ENCOUNTER — Encounter: Payer: Self-pay | Admitting: Family Medicine

## 2013-09-30 VITALS — BP 174/110 | HR 92 | Temp 98.6°F | Resp 16 | Ht 60.0 in | Wt 123.0 lb

## 2013-09-30 DIAGNOSIS — J302 Other seasonal allergic rhinitis: Secondary | ICD-10-CM

## 2013-09-30 DIAGNOSIS — J309 Allergic rhinitis, unspecified: Secondary | ICD-10-CM | POA: Insufficient documentation

## 2013-09-30 DIAGNOSIS — M25519 Pain in unspecified shoulder: Secondary | ICD-10-CM

## 2013-09-30 DIAGNOSIS — M25512 Pain in left shoulder: Secondary | ICD-10-CM

## 2013-09-30 DIAGNOSIS — I10 Essential (primary) hypertension: Secondary | ICD-10-CM

## 2013-09-30 DIAGNOSIS — J45909 Unspecified asthma, uncomplicated: Secondary | ICD-10-CM | POA: Insufficient documentation

## 2013-09-30 DIAGNOSIS — M542 Cervicalgia: Secondary | ICD-10-CM

## 2013-09-30 DIAGNOSIS — Z7251 High risk heterosexual behavior: Secondary | ICD-10-CM

## 2013-09-30 LAB — COMPREHENSIVE METABOLIC PANEL
ALT: 24 U/L (ref 0–35)
AST: 26 U/L (ref 0–37)
Albumin: 3.8 g/dL (ref 3.5–5.2)
Alkaline Phosphatase: 84 U/L (ref 39–117)
BILIRUBIN TOTAL: 0.5 mg/dL (ref 0.2–1.2)
BUN: 11 mg/dL (ref 6–23)
CO2: 27 meq/L (ref 19–32)
CREATININE: 1 mg/dL (ref 0.50–1.10)
Calcium: 10.4 mg/dL (ref 8.4–10.5)
Chloride: 107 mEq/L (ref 96–112)
Glucose, Bld: 100 mg/dL — ABNORMAL HIGH (ref 70–99)
Potassium: 3.7 mEq/L (ref 3.5–5.3)
Sodium: 144 mEq/L (ref 135–145)
Total Protein: 6.5 g/dL (ref 6.0–8.3)

## 2013-09-30 LAB — LIPID PANEL
CHOL/HDL RATIO: 3 ratio
Cholesterol: 159 mg/dL (ref 0–200)
HDL: 53 mg/dL (ref >39)
LDL Cholesterol: 81 mg/dL (ref 0–99)
TRIGLYCERIDES: 127 mg/dL (ref <150)
VLDL: 25 mg/dL (ref 0–40)

## 2013-09-30 LAB — POCT GLYCOSYLATED HEMOGLOBIN (HGB A1C): HEMOGLOBIN A1C: 5.2

## 2013-09-30 MED ORDER — METOPROLOL SUCCINATE ER 100 MG PO TB24: 100.0000 mg | ORAL_TABLET | Freq: Every day | ORAL | Status: DC

## 2013-09-30 MED ORDER — CLONIDINE HCL 0.1 MG PO TABS
0.2000 mg | ORAL_TABLET | Freq: Once | ORAL | Status: AC
  Administered 2013-09-30: 0.2 mg via ORAL

## 2013-09-30 MED ORDER — FLUTICASONE PROPIONATE 50 MCG/ACT NA SUSP: 2.0000 | Freq: Every day | NASAL | Status: DC

## 2013-09-30 MED ORDER — ALBUTEROL SULFATE HFA 108 (90 BASE) MCG/ACT IN AERS: 2.0000 | INHALATION_SPRAY | Freq: Four times a day (QID) | RESPIRATORY_TRACT | Status: DC | PRN

## 2013-09-30 MED ORDER — CLONIDINE HCL 0.2 MG PO TABS: 0.2000 mg | ORAL_TABLET | Freq: Every day | ORAL | Status: DC

## 2013-09-30 MED ORDER — HYDROCHLOROTHIAZIDE 25 MG PO TABS: 25.0000 mg | ORAL_TABLET | Freq: Two times a day (BID) | ORAL | Status: DC

## 2013-09-30 NOTE — Progress Notes (Signed)
Subjective:    Patient ID: Lori Bush, female    DOB: 1960/07/10, 53 y.o.   MRN: [id number]  Gynecologic Exam  Arthritis   This patient is here today to followup on hypertension. She also complains of right shoulder pain which has been chronic for the last 2 years but has recently been getting worse. She also requests a Pap smear.  She has long-standing hypertension. She is on numerous medications for her blood pressure. She tells me that she tries to be compliant but clonidine and metoprolol make her tired and so she only takes them at night. She denies chest pain, blurry vision, headaches.  She has right shoulder pain and some neck pain. She had x-rays in 2013 that showed degenerative changes of her neck.  She had a Pap smear one to 2 years ago which she says was normal. 3 years ago she had an abnormal Pap smear  She also complains of chronic cough and nasal congestion during the spring season when pollen is out. She admits to being a smoker however she has recently cut down to half pack a day from a full pack a day. She used an inhaler in the past as well as Flonase and found both of them to be helpful. She has not had any fevers and denies chest pain.  Review of Systems  Musculoskeletal: Positive for arthritis.   A 12 point review of systems is negative except as per hpi.       Objective:   Physical Exam  Genitourinary: Uterus is deviated. Cervix exhibits no motion tenderness, no discharge and no friability. Right adnexum displays no mass, no tenderness and no fullness. Left adnexum displays no mass and no tenderness.  Nl ext exam  Uterus deviated to the left    Nursing note and vitals reviewed. Constitutional: She is oriented to person, place, and time. She appears well-developed and well-nourished.  HENT:  Nose: Nose normal.  Mouth/Throat: Oropharynx is clear and moist. No oropharyngeal exudate.  Eyes: Conjunctivae are normal. Pupils are equal, round, and  reactive to light.  Neck: Normal range of motion. Neck supple. No thyromegaly present.  Cardiovascular: Normal rate, regular rhythm and normal heart sounds.   Pulmonary/Chest: Effort normal and breath sounds normal. cough Abdominal: Soft. Bowel sounds are normal. She exhibits no distension. There is no tenderness. There is no rebound.  Lymphadenopathy:    She has no cervical adenopathy.  Neurological: She is alert and oriented to person, place, and time. She has normal reflexes.  Skin: Skin is warm and dry.  Psychiatric: She has a normal mood and affect. Her behavior is normal.  msk - left shoulder - ROM to 90 degrees, no over or under scratch test      Assessment & Plan:  Lori Bush was seen today for gynecologic exam and arthritis.  Diagnoses and associated orders for this visit:  Accelerated hypertension - cloNIDine (CATAPRES) tablet 0.2 mg; Take 2 tablets (0.2 mg total) by mouth once. - cloNIDine (CATAPRES) 0.2 MG tablet; Take 1 tablet (0.2 mg total) by mouth daily after supper. - metoprolol succinate (TOPROL-XL) 100 MG 24 hr tablet; Take 1 tablet (100 mg total) by mouth daily. Take with or immediately following a meal. - hydrochlorothiazide (HYDRODIURIL) 25 MG tablet; Take 1 tablet (25 mg total) by mouth 2 (two) times daily. - Comprehensive metabolic panel - Microalbumin, urine - POCT urinalysis dipstick - Lipid Panel - TSH - 2D Echocardiogram without contrast - EKG 12-Lead - HgB A1c I am  concerned that her blood pressure has remained so high. She is unable to take clonidine or metoprolol in the morning so these will need to be ordered at night. I have changed her metoprolol to the apex L. and hopefully that will give her better all day coverage. She will take the clonidine at night as she has been doing. Instead of taking hydrochlorothiazide as well as chlorthalidone, we'll have her take hydrochlorothiazide twice daily. This is unlikely to make her feel tired. We'll leave her on  40 mg of lisinopril as there is not much data to support going higher. EKG in the office today not surprisingly shows left ventricular hypertrophy. Will get a echocardiogram and see how that looks. Also checking some other labs. If her hypertension continues to remain uncontrolled we may need to investigate some causes of secondary hypertension. Left shoulder pain - DG Shoulder Left; Future - MR Cervical Spine Wo Contrast; Future  Neck pain - MR Cervical Spine Wo Contrast; Future  Problems related to high-risk sexual behavior - STD Panel (HBSAG,HIV,RPR) - Cytology - PAP [place]  Seasonal allergies - fluticasone (FLONASE) 50 MCG/ACT nasal spray; Place 2 sprays into both nostrils daily.  Asthma, chronic - albuterol (PROVENTIL HFA;VENTOLIN HFA) 108 (90 BASE) MCG/ACT inhaler; Inhale 2 puffs into the lungs every 6 (six) hours as needed for wheezing or shortness of breath.

## 2013-09-30 NOTE — Patient Instructions (Signed)
DASH Diet  The DASH diet stands for "Dietary Approaches to Stop Hypertension." It is a healthy eating plan that has been shown to reduce high blood pressure (hypertension) in as little as 14 days, while also possibly providing other significant health benefits. These other health benefits include reducing the risk of breast cancer after menopause and reducing the risk of type 2 diabetes, heart disease, colon cancer, and stroke. Health benefits also include weight loss and slowing kidney failure in patients with chronic kidney disease.   DIET GUIDELINES  · Limit salt (sodium). Your diet should contain less than 1500 mg of sodium daily.  · Limit refined or processed carbohydrates. Your diet should include mostly whole grains. Desserts and added sugars should be used sparingly.  · Include small amounts of heart-healthy fats. These types of fats include nuts, oils, and tub margarine. Limit saturated and trans fats. These fats have been shown to be harmful in the body.  CHOOSING FOODS   The following food groups are based on a 2000 calorie diet. See your Registered Dietitian for individual calorie needs.  Grains and Grain Products (6 to 8 servings daily)  · Eat More Often: Whole-wheat bread, brown rice, whole-grain or wheat pasta, quinoa, popcorn without added fat or salt (air popped).  · Eat Less Often: White bread, white pasta, white rice, cornbread.  Vegetables (4 to 5 servings daily)  · Eat More Often: Fresh, frozen, and canned vegetables. Vegetables may be raw, steamed, roasted, or grilled with a minimal amount of fat.  · Eat Less Often/Avoid: Creamed or fried vegetables. Vegetables in a cheese sauce.  Fruit (4 to 5 servings daily)  · Eat More Often: All fresh, canned (in natural juice), or frozen fruits. Dried fruits without added sugar. One hundred percent fruit juice (½ cup [237 mL] daily).  · Eat Less Often: Dried fruits with added sugar. Canned fruit in light or heavy syrup.  Lean Meats, Fish, and Poultry (2  servings or less daily. One serving is 3 to 4 oz [85-114 g]).  · Eat More Often: Ninety percent or leaner ground beef, tenderloin, sirloin. Round cuts of beef, chicken breast, turkey breast. All fish. Grill, bake, or broil your meat. Nothing should be fried.  · Eat Less Often/Avoid: Fatty cuts of meat, turkey, or chicken leg, thigh, or wing. Fried cuts of meat or fish.  Dairy (2 to 3 servings)  · Eat More Often: Low-fat or fat-free milk, low-fat plain or light yogurt, reduced-fat or part-skim cheese.  · Eat Less Often/Avoid: Milk (whole, 2%). Whole milk yogurt. Full-fat cheeses.  Nuts, Seeds, and Legumes (4 to 5 servings per week)  · Eat More Often: All without added salt.  · Eat Less Often/Avoid: Salted nuts and seeds, canned beans with added salt.  Fats and Sweets (limited)  · Eat More Often: Vegetable oils, tub margarines without trans fats, sugar-free gelatin. Mayonnaise and salad dressings.  · Eat Less Often/Avoid: Coconut oils, palm oils, butter, stick margarine, cream, half and half, cookies, candy, pie.  FOR MORE INFORMATION  The Dash Diet Eating Plan: www.dashdiet.org  Document Released: 06/13/2011 Document Revised: 09/16/2011 Document Reviewed: 06/13/2011  [employer]® Patient Information ©2014 [employer].

## 2013-09-30 NOTE — Progress Notes (Signed)
Pt here for pap smear. Last done  C/o generalized pain all over x 1 week with cold sx unrelieved by OTC Robitussin or Mucinex Pt dont take BP meds today due to nausea Denies CP or SOB

## 2013-10-01 LAB — STD PANEL
HEP B S AG: NEGATIVE
HIV: NONREACTIVE

## 2013-10-01 LAB — MICROALBUMIN, URINE: MICROALB UR: 0.99 mg/dL (ref 0.00–1.89)

## 2013-10-01 LAB — TSH: TSH: 2.257 u[IU]/mL (ref 0.350–4.500)

## 2013-10-04 ENCOUNTER — Other Ambulatory Visit: Payer: Self-pay | Admitting: Family Medicine

## 2013-10-04 ENCOUNTER — Ambulatory Visit (HOSPITAL_COMMUNITY)
Admission: RE | Admit: 2013-10-04 | Discharge: 2013-10-04 | Disposition: A | Discharge status: Home / Self Care | Payer: Self-pay | Source: Ambulatory Visit | Attending: Family Medicine | Admitting: Family Medicine

## 2013-10-04 DIAGNOSIS — M25519 Pain in unspecified shoulder: Secondary | ICD-10-CM | POA: Insufficient documentation

## 2013-10-04 DIAGNOSIS — M25512 Pain in left shoulder: Secondary | ICD-10-CM

## 2013-10-06 LAB — CERVICOVAGINAL ANCILLARY ONLY
BACTERIAL VAGINITIS: POSITIVE — AB
Candida vaginitis: NEGATIVE

## 2013-10-07 ENCOUNTER — Telehealth: Payer: Self-pay

## 2013-10-07 MED ORDER — METRONIDAZOLE 500 MG PO TABS: 500.0000 mg | ORAL_TABLET | Freq: Two times a day (BID) | ORAL | Status: DC

## 2013-10-07 NOTE — Telephone Encounter (Signed)
Patient not available Left message to return our call 

## 2013-10-07 NOTE — Telephone Encounter (Signed)
Message copied by Dorothe Pea on Thu Oct 07, 2013  3:25 PM ------      Message from: Angelica Chessman E      Created: Wed Oct 06, 2013  4:38 PM       Please inform patient that her Pap smear results showed that she has infection with bacteria vaginosis, this is not a sexually transmitted disease and treated with antibiotics.      Please call with metronidazole 500 mg tablet by mouth twice a day for 7 days ------

## 2013-10-07 NOTE — Telephone Encounter (Signed)
Patient returned call Is aware of her pap smear results Prescription sent to [facility]

## 2013-10-15 ENCOUNTER — Ambulatory Visit (HOSPITAL_COMMUNITY): Admission: RE | Admit: 2013-10-15 | Payer: Self-pay | Source: Ambulatory Visit

## 2013-10-15 ENCOUNTER — Emergency Department (HOSPITAL_COMMUNITY)
Admission: EM | Admit: 2013-10-15 | Discharge: 2013-10-16 | Disposition: A | Discharge status: Home / Self Care | Payer: Self-pay | Attending: Emergency Medicine | Admitting: Emergency Medicine

## 2013-10-15 ENCOUNTER — Encounter (HOSPITAL_COMMUNITY): Payer: Self-pay | Admitting: Emergency Medicine

## 2013-10-15 ENCOUNTER — Emergency Department (HOSPITAL_COMMUNITY): Payer: Self-pay

## 2013-10-15 DIAGNOSIS — IMO0002 Reserved for concepts with insufficient information to code with codable children: Secondary | ICD-10-CM | POA: Insufficient documentation

## 2013-10-15 DIAGNOSIS — I10 Essential (primary) hypertension: Secondary | ICD-10-CM | POA: Insufficient documentation

## 2013-10-15 DIAGNOSIS — F17200 Nicotine dependence, unspecified, uncomplicated: Secondary | ICD-10-CM | POA: Insufficient documentation

## 2013-10-15 DIAGNOSIS — Z79899 Other long term (current) drug therapy: Secondary | ICD-10-CM | POA: Insufficient documentation

## 2013-10-15 DIAGNOSIS — Z792 Long term (current) use of antibiotics: Secondary | ICD-10-CM | POA: Insufficient documentation

## 2013-10-15 DIAGNOSIS — R51 Headache: Secondary | ICD-10-CM | POA: Insufficient documentation

## 2013-10-15 LAB — CBC WITH DIFFERENTIAL/PLATELET
BASOS ABS: 0.1 10*3/uL (ref 0.0–0.1)
BASOS PCT: 1 % (ref 0–1)
Eosinophils Absolute: 0.6 10*3/uL (ref 0.0–0.7)
Eosinophils Relative: 8 % — ABNORMAL HIGH (ref 0–5)
HCT: 44.9 % (ref 36.0–46.0)
HEMOGLOBIN: 15.9 g/dL — AB (ref 12.0–15.0)
Lymphocytes Relative: 36 % (ref 12–46)
Lymphs Abs: 2.5 10*3/uL (ref 0.7–4.0)
MCH: 30.5 pg (ref 26.0–34.0)
MCHC: 35.4 g/dL (ref 30.0–36.0)
MCV: 86 fL (ref 78.0–100.0)
Monocytes Absolute: 0.7 10*3/uL (ref 0.1–1.0)
Monocytes Relative: 11 % (ref 3–12)
NEUTROS ABS: 3.1 10*3/uL (ref 1.7–7.7)
NEUTROS PCT: 44 % (ref 43–77)
Platelets: 247 10*3/uL (ref 150–400)
RBC: 5.22 MIL/uL — ABNORMAL HIGH (ref 3.87–5.11)
RDW: 13.4 % (ref 11.5–15.5)
WBC: 6.9 10*3/uL (ref 4.0–10.5)

## 2013-10-15 LAB — URINALYSIS, ROUTINE W REFLEX MICROSCOPIC
BILIRUBIN URINE: NEGATIVE
GLUCOSE, UA: NEGATIVE mg/dL
HGB URINE DIPSTICK: NEGATIVE
KETONES UR: NEGATIVE mg/dL
Nitrite: NEGATIVE
PROTEIN: NEGATIVE mg/dL
Specific Gravity, Urine: 1.018 (ref 1.005–1.030)
UROBILINOGEN UA: 0.2 mg/dL (ref 0.0–1.0)
pH: 6.5 (ref 5.0–8.0)

## 2013-10-15 LAB — COMPREHENSIVE METABOLIC PANEL
ALBUMIN: 3.6 g/dL (ref 3.5–5.2)
ALK PHOS: 96 U/L (ref 39–117)
ALT: 53 U/L — AB (ref 0–35)
AST: 68 U/L — AB (ref 0–37)
BILIRUBIN TOTAL: 0.3 mg/dL (ref 0.3–1.2)
BUN: 15 mg/dL (ref 6–23)
CHLORIDE: 104 meq/L (ref 96–112)
CO2: 22 mEq/L (ref 19–32)
Calcium: 10.6 mg/dL — ABNORMAL HIGH (ref 8.4–10.5)
Creatinine, Ser: 1.04 mg/dL (ref 0.50–1.10)
GFR calc Af Amer: 70 mL/min — ABNORMAL LOW (ref >90)
GFR, EST NON AFRICAN AMERICAN: 61 mL/min — AB (ref >90)
Glucose, Bld: 104 mg/dL — ABNORMAL HIGH (ref 70–99)
POTASSIUM: 3.7 meq/L (ref 3.7–5.3)
Sodium: 141 mEq/L (ref 137–147)
Total Protein: 7 g/dL (ref 6.0–8.3)

## 2013-10-15 LAB — URINE MICROSCOPIC-ADD ON

## 2013-10-15 MED ORDER — KETOROLAC TROMETHAMINE 30 MG/ML IJ SOLN
30.0000 mg | Freq: Once | INTRAMUSCULAR | Status: AC
  Administered 2013-10-15: 30 mg via INTRAVENOUS
  Filled 2013-10-15: qty 1

## 2013-10-15 MED ORDER — SODIUM CHLORIDE 0.9 % IV SOLN
Freq: Once | INTRAVENOUS | Status: AC
  Administered 2013-10-15: 22:00:00 via INTRAVENOUS

## 2013-10-15 NOTE — ED Notes (Signed)
The pt is c/o a headache with a high bp since 2000 last pm and she has had aches and pains all over her body.  She had not had 2 of her bps meds for approx 4 days unable to get then filled

## 2013-10-15 NOTE — Discharge Instructions (Signed)
Return here as needed.  Followup with your primary care Dr. for recheck.  Your testing here today was normal

## 2013-10-15 NOTE — ED Provider Notes (Signed)
CSN: [id number]     Arrival date & time 10/15/13  1753 History   First MD Initiated Contact with Patient 10/15/13 2042     Chief Complaint  Patient presents with  . Hypertension     (Consider location/radiation/quality/duration/timing/severity/associated sxs/prior Treatment) HPI Patient presents to the emergency department with elevated blood pressure other than a chronic issue for her over the last several years.  Patient, states they had difficulty controlling her blood pressure.  Patient, states she is currently having some mild headaches, but no chest pain, shortness of breath, nausea, vomiting, diarrhea, abdominal pain, dysuria, weakness, dizziness vaginal bleeding, or syncope.  The patient, states, that she did not take any other medications prior to arrival.  Patient, states nothing seems to make her condition, better or worse Past Medical History  Diagnosis Date  . Hypertension    Past Surgical History  Procedure Laterality Date  . Cesarean section     No family history on file. History  Substance Use Topics  . Smoking status: Current Every Day Smoker  . Smokeless tobacco: Not on file  . Alcohol Use: No   OB History   Grav Para Term Preterm Abortions TAB SAB Ect Mult Living                 Review of Systems All other systems negative except as documented in the HPI. All pertinent positives and negatives as reviewed in the HPI.   Allergies  Review of patient's allergies indicates no known allergies.  Home Medications   Current Outpatient Rx  Name  Route  Sig  Dispense  Refill  . albuterol (PROVENTIL HFA;VENTOLIN HFA) 108 (90 BASE) MCG/ACT inhaler   Inhalation   Inhale 2 puffs into the lungs every 6 (six) hours as needed for wheezing or shortness of breath.   1 Inhaler   5   . amLODipine (NORVASC) 10 MG tablet   Oral   Take 1 tablet (10 mg total) by mouth daily.   30 tablet   2   . cloNIDine (CATAPRES) 0.2 MG tablet   Oral   Take 1 tablet (0.2 mg total)  by mouth daily after supper.   60 tablet   3   . fluticasone (FLONASE) 50 MCG/ACT nasal spray   Each Nare   Place 2 sprays into both nostrils daily.   16 g   6   . hydrochlorothiazide (HYDRODIURIL) 25 MG tablet   Oral   Take 1 tablet (25 mg total) by mouth 2 (two) times daily.   60 tablet   3   . lisinopril (PRINIVIL,ZESTRIL) 40 MG tablet   Oral   Take 1 tablet (40 mg total) by mouth daily.   30 tablet   2   . metoprolol succinate (TOPROL-XL) 100 MG 24 hr tablet   Oral   Take 1 tablet (100 mg total) by mouth daily. Take with or immediately following a meal.   90 tablet   3   . metroNIDAZOLE (FLAGYL) 500 MG tablet   Oral   Take 1 tablet (500 mg total) by mouth 2 (two) times daily.   14 tablet   0   . Multiple Vitamin (MULTIVITAMIN WITH MINERALS) TABS   Oral   Take 1 tablet by mouth daily.   30 tablet   3    BP 184/96  Pulse 76  Temp(Src) 98.2 F (36.8 C) (Oral)  Resp 15  SpO2 99% Physical Exam  Nursing note and vitals reviewed. Constitutional: She is oriented to  person, place, and time. She appears well-developed and well-nourished. No distress.  HENT:  Head: Normocephalic and atraumatic.  Eyes: Pupils are equal, round, and reactive to light.  Neck: Normal range of motion. Neck supple.  Cardiovascular: Normal rate, regular rhythm and normal heart sounds.  Exam reveals no gallop and no friction rub.   No murmur heard. Pulmonary/Chest: Effort normal and breath sounds normal. No respiratory distress.  Abdominal: Soft. Bowel sounds are normal.  Neurological: She is alert and oriented to person, place, and time. She displays normal reflexes. She exhibits normal muscle tone. Coordination normal.  Skin: Skin is warm and dry.    ED Course  Procedures (including critical care time) Labs Review Labs Reviewed  CBC WITH DIFFERENTIAL - Abnormal; Notable for the following:    RBC 5.22 (*)    Hemoglobin 15.9 (*)    Eosinophils Relative 8 (*)    All other  components within normal limits  COMPREHENSIVE METABOLIC PANEL - Abnormal; Notable for the following:    Glucose, Bld 104 (*)    Calcium 10.6 (*)    AST 68 (*)    ALT 53 (*)    GFR calc non Af Amer 61 (*)    GFR calc Af Amer 70 (*)    All other components within normal limits  URINALYSIS, ROUTINE W REFLEX MICROSCOPIC - Abnormal; Notable for the following:    Leukocytes, UA SMALL (*)    All other components within normal limits  URINE MICROSCOPIC-ADD ON - Abnormal; Notable for the following:    Squamous Epithelial / LPF FEW (*)    Bacteria, UA FEW (*)    All other components within normal limits   Imaging Review Dg Chest 2 View  10/15/2013   CLINICAL DATA:  Body aches.  Hypertension.  EXAM: CHEST  2 VIEW  COMPARISON:  09/30/2010  FINDINGS: The heart size and mediastinal contours are within normal limits. Both lungs are clear. The visualized skeletal structures are unremarkable.  IMPRESSION: No active cardiopulmonary disease.   Electronically Signed   By: Kerby Moors M.D.   On: 10/15/2013 21:53    Patient has been stable here in the emergency department.  She voices an understanding of the plan to followup with your Dr. for further evaluation of her blood pressure.  The patient isn't showing any signs of questions were answered.  The patient voices understanding.  Patient feels better following IV fluids and Toradol for her headache.   Brent General, PA-C 10/20/13 0140

## 2013-10-16 NOTE — ED Notes (Signed)
PA aware of pt BP upon discharge

## 2013-10-18 ENCOUNTER — Ambulatory Visit (HOSPITAL_COMMUNITY): Admission: RE | Admit: 2013-10-18 | Payer: Self-pay | Source: Ambulatory Visit

## 2013-10-20 NOTE — ED Provider Notes (Signed)
Medical screening examination/treatment/procedure(s) were performed by non-physician practitioner and as supervising physician I was immediately available for consultation/collaboration.   EKG Interpretation None       Threasa Beards, MD 10/20/13 [phone]

## 2013-10-21 ENCOUNTER — Emergency Department (HOSPITAL_COMMUNITY)
Admission: EM | Admit: 2013-10-21 | Discharge: 2013-10-21 | Disposition: A | Discharge status: Home / Self Care | Payer: Self-pay | Attending: Emergency Medicine | Admitting: Emergency Medicine

## 2013-10-21 ENCOUNTER — Emergency Department (HOSPITAL_COMMUNITY): Payer: Self-pay

## 2013-10-21 ENCOUNTER — Encounter (HOSPITAL_COMMUNITY): Payer: Self-pay | Admitting: Emergency Medicine

## 2013-10-21 DIAGNOSIS — R42 Dizziness and giddiness: Secondary | ICD-10-CM | POA: Insufficient documentation

## 2013-10-21 DIAGNOSIS — R630 Anorexia: Secondary | ICD-10-CM | POA: Insufficient documentation

## 2013-10-21 DIAGNOSIS — R1013 Epigastric pain: Secondary | ICD-10-CM | POA: Insufficient documentation

## 2013-10-21 DIAGNOSIS — R519 Headache, unspecified: Secondary | ICD-10-CM

## 2013-10-21 DIAGNOSIS — R1110 Vomiting, unspecified: Secondary | ICD-10-CM

## 2013-10-21 DIAGNOSIS — I10 Essential (primary) hypertension: Secondary | ICD-10-CM | POA: Insufficient documentation

## 2013-10-21 DIAGNOSIS — F17200 Nicotine dependence, unspecified, uncomplicated: Secondary | ICD-10-CM | POA: Insufficient documentation

## 2013-10-21 DIAGNOSIS — R51 Headache: Secondary | ICD-10-CM | POA: Insufficient documentation

## 2013-10-21 DIAGNOSIS — Z79899 Other long term (current) drug therapy: Secondary | ICD-10-CM | POA: Insufficient documentation

## 2013-10-21 DIAGNOSIS — IMO0002 Reserved for concepts with insufficient information to code with codable children: Secondary | ICD-10-CM | POA: Insufficient documentation

## 2013-10-21 DIAGNOSIS — R112 Nausea with vomiting, unspecified: Secondary | ICD-10-CM | POA: Insufficient documentation

## 2013-10-21 DIAGNOSIS — H538 Other visual disturbances: Secondary | ICD-10-CM | POA: Insufficient documentation

## 2013-10-21 LAB — URINALYSIS, ROUTINE W REFLEX MICROSCOPIC
BILIRUBIN URINE: NEGATIVE
Glucose, UA: NEGATIVE mg/dL
HGB URINE DIPSTICK: NEGATIVE
Ketones, ur: NEGATIVE mg/dL
Nitrite: NEGATIVE
Protein, ur: NEGATIVE mg/dL
Specific Gravity, Urine: 1.013 (ref 1.005–1.030)
UROBILINOGEN UA: 1 mg/dL (ref 0.0–1.0)
pH: 5.5 (ref 5.0–8.0)

## 2013-10-21 LAB — CBC WITH DIFFERENTIAL/PLATELET
Basophils Absolute: 0.1 10*3/uL (ref 0.0–0.1)
Basophils Relative: 2 % — ABNORMAL HIGH (ref 0–1)
EOS ABS: 0.5 10*3/uL (ref 0.0–0.7)
Eosinophils Relative: 7 % — ABNORMAL HIGH (ref 0–5)
HCT: 42.1 % (ref 36.0–46.0)
HEMOGLOBIN: 14.7 g/dL (ref 12.0–15.0)
Lymphocytes Relative: 36 % (ref 12–46)
Lymphs Abs: 2.3 10*3/uL (ref 0.7–4.0)
MCH: 29.6 pg (ref 26.0–34.0)
MCHC: 34.9 g/dL (ref 30.0–36.0)
MCV: 84.9 fL (ref 78.0–100.0)
MONOS PCT: 10 % (ref 3–12)
Monocytes Absolute: 0.6 10*3/uL (ref 0.1–1.0)
NEUTROS PCT: 46 % (ref 43–77)
Neutro Abs: 2.9 10*3/uL (ref 1.7–7.7)
Platelets: 238 10*3/uL (ref 150–400)
RBC: 4.96 MIL/uL (ref 3.87–5.11)
RDW: 12.9 % (ref 11.5–15.5)
WBC: 6.4 10*3/uL (ref 4.0–10.5)

## 2013-10-21 LAB — COMPREHENSIVE METABOLIC PANEL
ALT: 41 U/L — ABNORMAL HIGH (ref 0–35)
AST: 44 U/L — ABNORMAL HIGH (ref 0–37)
Albumin: 3.5 g/dL (ref 3.5–5.2)
Alkaline Phosphatase: 81 U/L (ref 39–117)
BUN: 16 mg/dL (ref 6–23)
CO2: 26 mEq/L (ref 19–32)
CREATININE: 0.96 mg/dL (ref 0.50–1.10)
Calcium: 10.8 mg/dL — ABNORMAL HIGH (ref 8.4–10.5)
Chloride: 103 mEq/L (ref 96–112)
GFR calc Af Amer: 77 mL/min — ABNORMAL LOW (ref >90)
GFR calc non Af Amer: 67 mL/min — ABNORMAL LOW (ref >90)
GLUCOSE: 97 mg/dL (ref 70–99)
Potassium: 3.7 mEq/L (ref 3.7–5.3)
Sodium: 141 mEq/L (ref 137–147)
TOTAL PROTEIN: 6.8 g/dL (ref 6.0–8.3)
Total Bilirubin: 0.3 mg/dL (ref 0.3–1.2)

## 2013-10-21 LAB — URINE MICROSCOPIC-ADD ON

## 2013-10-21 LAB — TROPONIN I

## 2013-10-21 MED ORDER — DIPHENHYDRAMINE HCL 25 MG PO TABS: 50.0000 mg | ORAL_TABLET | ORAL | Status: DC | PRN

## 2013-10-21 MED ORDER — LABETALOL HCL 5 MG/ML IV SOLN: 40.0000 mg | Freq: Once | INTRAVENOUS | Status: DC

## 2013-10-21 MED ORDER — CLONIDINE HCL 0.1 MG PO TABS
0.1000 mg | ORAL_TABLET | Freq: Once | ORAL | Status: AC
  Administered 2013-10-21: 0.1 mg via ORAL
  Filled 2013-10-21: qty 1

## 2013-10-21 MED ORDER — HYDROMORPHONE HCL PF 1 MG/ML IJ SOLN
0.5000 mg | Freq: Once | INTRAMUSCULAR | Status: AC
  Administered 2013-10-21: 0.5 mg via INTRAVENOUS
  Filled 2013-10-21: qty 1

## 2013-10-21 MED ORDER — DIPHENHYDRAMINE HCL 50 MG/ML IJ SOLN
25.0000 mg | Freq: Once | INTRAMUSCULAR | Status: AC
  Administered 2013-10-21: 25 mg via INTRAVENOUS
  Filled 2013-10-21: qty 1

## 2013-10-21 MED ORDER — METOCLOPRAMIDE HCL 10 MG PO TABS: 10.0000 mg | ORAL_TABLET | Freq: Four times a day (QID) | ORAL | Status: DC | PRN

## 2013-10-21 MED ORDER — LABETALOL HCL 5 MG/ML IV SOLN
20.0000 mg | Freq: Once | INTRAVENOUS | Status: AC
  Administered 2013-10-21: 20 mg via INTRAVENOUS
  Filled 2013-10-21: qty 4

## 2013-10-21 MED ORDER — SODIUM CHLORIDE 0.9 % IV BOLUS (SEPSIS)
500.0000 mL | Freq: Once | INTRAVENOUS | Status: AC
  Administered 2013-10-21: 500 mL via INTRAVENOUS

## 2013-10-21 MED ORDER — METOCLOPRAMIDE HCL 5 MG/ML IJ SOLN
10.0000 mg | Freq: Once | INTRAMUSCULAR | Status: AC
  Administered 2013-10-21: 10 mg via INTRAVENOUS
  Filled 2013-10-21: qty 2

## 2013-10-21 MED ORDER — HYDRALAZINE HCL 20 MG/ML IJ SOLN
10.0000 mg | Freq: Once | INTRAMUSCULAR | Status: DC
  Filled 2013-10-21: qty 0.5

## 2013-10-21 NOTE — ED Notes (Signed)
AVS given to patient. Aware she is to take all regularly prescribed BP medications. Ambulatory with steady gait.

## 2013-10-21 NOTE — ED Notes (Signed)
MD aware systolic BP now 010. Advised not to give hydralazine 10mg . Order dc'd. Family aware of decision.

## 2013-10-21 NOTE — Progress Notes (Signed)
[facility] provided pt with a [employer] application since [employer] expired on 07/22/13. Patient PCP [facility].

## 2013-10-21 NOTE — ED Provider Notes (Addendum)
CSN: [id number]     Arrival date & time 10/21/13  1245 History   First MD Initiated Contact with Patient 10/21/13 1306     Chief Complaint  Patient presents with  . Emesis  . Headache     (Consider location/radiation/quality/duration/timing/severity/associated sxs/prior Treatment) HPI Comments: 53 year old female with high blood pressure history, smoking presents with high blood pressure, vomiting and headache. Patient has had difficulty controlling her blood pressure the past few weeks with numbers ranging between 355 20 systolic. Normally her blood pressure ranges in the 732K to 025 systolic range. Patient takes clonidine, metoprolol and amlodipine as directed however recently she is unable to tolerate her oral medications due to nausea and vomiting. Patient has mild epigastric pain with a small amount of blood in her vomit. Patient does use NSAIDs regularly however no known ulcers. Patient denies alcohol and legal drug use no known liver disorders.  Patient is a 53 y.o. female presenting with vomiting and headaches. The history is provided by the patient.  Emesis Associated symptoms: abdominal pain (epigastric, nonradiating) and headaches   Associated symptoms: no chills and no diarrhea   Headache Associated symptoms: abdominal pain (epigastric, nonradiating) and vomiting   Associated symptoms: no back pain, no congestion, no diarrhea, no fever, no neck pain and no neck stiffness     Past Medical History  Diagnosis Date  . Hypertension    Past Surgical History  Procedure Laterality Date  . Cesarean section     No family history on file. History  Substance Use Topics  . Smoking status: Current Every Day Smoker  . Smokeless tobacco: Not on file  . Alcohol Use: No   OB History   Grav Para Term Preterm Abortions TAB SAB Ect Mult Living                 Review of Systems  Constitutional: Positive for appetite change. Negative for fever and chills.  HENT: Negative for  congestion.   Eyes: Positive for visual disturbance (blurry bilateral).  Respiratory: Negative for shortness of breath.   Cardiovascular: Negative for chest pain.  Gastrointestinal: Positive for vomiting and abdominal pain (epigastric, nonradiating). Negative for diarrhea and blood in stool.  Genitourinary: Negative for dysuria and flank pain.  Musculoskeletal: Negative for back pain, neck pain and neck stiffness.  Skin: Negative for rash.  Neurological: Positive for light-headedness and headaches.      Allergies  Review of patient's allergies indicates no known allergies.  Home Medications   Prior to Admission medications   Medication Sig Start Date End Date Taking? Authorizing Provider  amLODipine (NORVASC) 10 MG tablet Take 1 tablet (10 mg total) by mouth daily. 06/11/13  Yes Angelica Chessman, MD  cloNIDine (CATAPRES) 0.2 MG tablet Take 1 tablet (0.2 mg total) by mouth daily after supper. 09/30/13  Yes Doran Heater, MD  fluticasone (FLONASE) 50 MCG/ACT nasal spray Place 2 sprays into both nostrils daily. 09/30/13  Yes Doran Heater, MD  hydrochlorothiazide (HYDRODIURIL) 25 MG tablet Take 1 tablet (25 mg total) by mouth 2 (two) times daily. 09/30/13  Yes Doran Heater, MD  ibuprofen (ADVIL,MOTRIN) 100 MG chewable tablet Chew 400 mg by mouth every 8 (eight) hours as needed for moderate pain.   Yes Historical Provider, MD  metoprolol succinate (TOPROL-XL) 100 MG 24 hr tablet Take 1 tablet (100 mg total) by mouth daily. Take with or immediately following a meal. 09/30/13  Yes Doran Heater, MD  metroNIDAZOLE (FLAGYL) 500 MG tablet Take 1  tablet (500 mg total) by mouth 2 (two) times daily. 10/07/13  Yes Angelica Chessman, MD  Multiple Vitamin (MULTIVITAMIN WITH MINERALS) TABS Take 1 tablet by mouth daily. 09/09/12  Yes Thurnell Lose, MD   BP 204/99  Pulse 63  Temp(Src) 98.5 F (36.9 C) (Oral)  Resp 20  SpO2 96% Physical Exam  Nursing note and vitals reviewed. Constitutional: She is  oriented to person, place, and time. She appears well-developed and well-nourished.  HENT:  Head: Normocephalic and atraumatic.  Dry mucous membranes  Eyes: Conjunctivae are normal. Right eye exhibits no discharge. Left eye exhibits no discharge.  Neck: Normal range of motion. Neck supple. No tracheal deviation present.  Cardiovascular: Normal rate and regular rhythm.   Pulmonary/Chest: Effort normal and breath sounds normal.  Abdominal: Soft. She exhibits no distension. There is tenderness. There is no guarding.  Musculoskeletal: She exhibits no edema.  Neurological: She is alert and oriented to person, place, and time. GCS eye subscore is 4. GCS verbal subscore is 5. GCS motor subscore is 6.  5+ strength in UE and LE with f/e at major joints. Sensation to palpation intact in UE and LE. CNs 2-12 grossly intact.  EOMFI.  PERRL.   Finger nose and coordination intact bilateral.   Visual fields intact to finger testing.   Skin: Skin is warm. No rash noted.  Psychiatric: She has a normal mood and affect.    ED Course  Procedures (including critical care time) Labs Review Labs Reviewed  CBC WITH DIFFERENTIAL - Abnormal; Notable for the following:    Eosinophils Relative 7 (*)    Basophils Relative 2 (*)    All other components within normal limits  URINALYSIS, ROUTINE W REFLEX MICROSCOPIC - Abnormal; Notable for the following:    APPearance CLOUDY (*)    Leukocytes, UA SMALL (*)    All other components within normal limits  COMPREHENSIVE METABOLIC PANEL - Abnormal; Notable for the following:    Calcium 10.8 (*)    AST 44 (*)    ALT 41 (*)    GFR calc non Af Amer 67 (*)    GFR calc Af Amer 77 (*)    All other components within normal limits  URINE MICROSCOPIC-ADD ON  TROPONIN I    Imaging Review Ct Head Wo Contrast  10/21/2013   CLINICAL DATA:  Frontal headaches for 2 days  EXAM: CT HEAD WITHOUT CONTRAST  TECHNIQUE: Contiguous axial images were obtained from the base of the  skull through the vertex without intravenous contrast.  COMPARISON:  None.  FINDINGS: No acute intracranial hemorrhage. No focal mass lesion. No CT evidence of acute infarction. No midline shift or mass effect. No hydrocephalus. Basilar cisterns are patent.  There are small foci of gas scattered throughout the skullbase associated with venous structures. This likely relates to benign gas from vena puncture.  Paranasal sinuses and  mastoid air cells are clear.  IMPRESSION: No acute intracranial findings.   Electronically Signed   By: Suzy Bouchard M.D.   On: 10/21/2013 14:36     EKG Interpretation   Date/Time:  Thursday October 21 2013 13:55:41 EDT Ventricular Rate:  54 PR Interval:  113 QRS Duration: 88 QT Interval:  448 QTC Calculation: 425 R Axis:   63 Text Interpretation:  Sinus rhythm Borderline short PR interval Probable  left ventricular hypertrophy Confirmed by Gordan Grell  MD, Averlee Swartz ([id number]) on  10/21/2013 2:35:07 PM      MDM   Final diagnoses:  None  Vomiting and epigastric pain for 2 days with mild epigastric discomfort plan for urinalysis, abdominal labs, fluids, antiemetics. Abdominal exam overall benign. High blood pressure likely combination of chronic hypertension and acute worsening due to decreased by mouth intake and not taking medicines for a few days. Plan for pain medicines and antiemetics. With with headache, vomiting and high blood pressure CT scan to rule out bleed.  Patient feels the headache is overall similar previous and was gradual onset and patient has no signs of meningitis. Labetalol given, mild improved bp. Hydralazine ordered per [facility], they will see pt.  The patients results and plan were reviewed and discussed.   Any x-rays performed were personally reviewed by myself.   Differential diagnosis were considered with the presenting HPI.  EKG:   Filed Vitals:   10/21/13 1411 10/21/13 1413 10/21/13 1418 10/21/13 1502  BP:    213/117  Pulse: 54 54     Temp:   97.9 F (36.6 C)   TempSrc:   Oral   Resp: 14   14  SpO2: 98%       Admission/ observation were discussed with the admitting physician [facility], patient and/or family and they are comfortable with the plan.   HTN urgency, vomiting, headache  Mariea Clonts, MD 10/21/13 1548 [facility] hospitalist evaluated patient, bp improved to 580D systolic, pt feels improved.  Held hydralazine Results and differential diagnosis were discussed with the patient. Close follow up outpatient was discussed, patient comfortable with the plan.   Filed Vitals:   10/21/13 1411 10/21/13 1413 10/21/13 1418 10/21/13 1502  BP:    213/117  Pulse: 54 54    Temp:   97.9 F (36.6 C)   TempSrc:   Oral   Resp: 14   14  SpO2: 98%        Mariea Clonts, MD 10/21/13 [phone]

## 2013-10-21 NOTE — ED Notes (Signed)
Opt c/o vomiting, headache, and generalized body ache x 3 days. States she has seen blood in her vomit.

## 2013-10-21 NOTE — ED Notes (Signed)
A&Ox4. Pt says H, V, body aches has been present since last Friday when she was seen at [facility]. BP 204/99 MAP 126. States usual BP is "150-160 on the top." Patient resting. Awaiting MD orders.

## 2013-10-21 NOTE — Discharge Instructions (Signed)
If you were given medicines take as directed.  If you are on coumadin or contraceptives realize their levels and effectiveness is altered by many different medicines.  If you have any reaction (rash, tongues swelling, other) to the medicines stop taking and see a physician.   Please follow up as directed and return to the ER or see a physician for new or worsening symptoms.  Thank you.  Arterial Hypertension Arterial hypertension (high blood pressure) is a condition of elevated pressure in your blood vessels. Hypertension over a long period of time is a risk factor for strokes, heart attacks, and heart failure. It is also the leading cause of kidney (renal) failure.  CAUSES   In Adults -- Over 90% of all hypertension has no known cause. This is called essential or primary hypertension. In the other 10% of people with hypertension, the increase in blood pressure is caused by another disorder. This is called secondary hypertension. Important causes of secondary hypertension are:  Heavy alcohol use.  Obstructive sleep apnea.  Hyperaldosterosim (Conn's syndrome).  Steroid use.  Chronic kidney failure.  Hyperparathyroidism.  Medications.  Renal artery stenosis.  Pheochromocytoma.  Cushing's disease.  Coarctation of the aorta.  Scleroderma renal crisis.  Licorice (in excessive amounts).  Drugs (cocaine, methamphetamine). Your caregiver can explain any items above that apply to you.  In Children -- Secondary hypertension is more common and should always be considered.  Pregnancy -- Few women of childbearing age have high blood pressure. However, up to 10% of them develop hypertension of pregnancy. Generally, this will not harm the woman. It may be a sign of 3 complications of pregnancy: preeclampsia, HELLP syndrome, and eclampsia. Follow up and control with medication is necessary. SYMPTOMS   This condition normally does not produce any noticeable symptoms. It is usually found  during a routine exam.  Malignant hypertension is a late problem of high blood pressure. It may have the following symptoms:  Headaches.  Blurred vision.  End-organ damage (this means your kidneys, heart, lungs, and other organs are being damaged).  Stressful situations can increase the blood pressure. If a person with normal blood pressure has their blood pressure go up while being seen by their caregiver, this is often termed "white coat hypertension." Its importance is not known. It may be related with eventually developing hypertension or complications of hypertension.  Hypertension is often confused with mental tension, stress, and anxiety. DIAGNOSIS  The diagnosis is made by 3 separate blood pressure measurements. They are taken at least 1 week apart from each other. If there is organ damage from hypertension, the diagnosis may be made without repeat measurements. Hypertension is usually identified by having blood pressure readings:  Above 140/90 mmHg measured in both arms, at 3 separate times, over a couple weeks.  Over 130/80 mmHg should be considered a risk factor and may require treatment in patients with diabetes. Blood pressure readings over 120/80 mmHg are called "pre-hypertension" even in non-diabetic patients. To get a true blood pressure measurement, use the following guidelines. Be aware of the factors that can alter blood pressure readings.  Take measurements at least 1 hour after caffeine.  Take measurements 30 minutes after smoking and without any stress. This is another reason to quit smoking  it raises your blood pressure.  Use a proper cuff size. Ask your caregiver if you are not sure about your cuff size.  Most home blood pressure cuffs are automatic. They will measure systolic and diastolic pressures. The systolic pressure  is the pressure reading at the start of sounds. Diastolic pressure is the pressure at which the sounds disappear. If you are elderly, measure  pressures in multiple postures. Try sitting, lying or standing.  Sit at rest for a minimum of 5 minutes before taking measurements.  You should not be on any medications like decongestants. These are found in many cold medications.  Record your blood pressure readings and review them with your caregiver. If you have hypertension:  Your caregiver may do tests to be sure you do not have secondary hypertension (see "causes" above).  Your caregiver may also look for signs of metabolic syndrome. This is also called Syndrome X or Insulin Resistance Syndrome. You may have this syndrome if you have type 2 diabetes, abdominal obesity, and abnormal blood lipids in addition to hypertension.  Your caregiver will take your medical and family history and perform a physical exam.  Diagnostic tests may include blood tests (for glucose, cholesterol, potassium, and kidney function), a urinalysis, or an EKG. Other tests may also be necessary depending on your condition. PREVENTION  There are important lifestyle issues that you can adopt to reduce your chance of developing hypertension:  Maintain a normal weight.  Limit the amount of salt (sodium) in your diet.  Exercise often.  Limit alcohol intake.  Get enough potassium in your diet. Discuss specific advice with your caregiver.  Follow a DASH diet (dietary approaches to stop hypertension). This diet is rich in fruits, vegetables, and low-fat dairy products, and avoids certain fats. PROGNOSIS  Essential hypertension cannot be cured. Lifestyle changes and medical treatment can lower blood pressure and reduce complications. The prognosis of secondary hypertension depends on the underlying cause. Many people whose hypertension is controlled with medicine or lifestyle changes can live a normal, healthy life.  RISKS AND COMPLICATIONS  While high blood pressure alone is not an illness, it often requires treatment due to its short- and long-term effects on  many organs. Hypertension increases your risk for:  CVAs or strokes (cerebrovascular accident).  Heart failure due to chronically high blood pressure (hypertensive cardiomyopathy).  Heart attack (myocardial infarction).  Damage to the retina (hypertensive retinopathy).  Kidney failure (hypertensive nephropathy). Your caregiver can explain list items above that apply to you. Treatment of hypertension can significantly reduce the risk of complications. TREATMENT   For overweight patients, weight loss and regular exercise are recommended. Physical fitness lowers blood pressure.  Mild hypertension is usually treated with diet and exercise. A diet rich in fruits and vegetables, fat-free dairy products, and foods low in fat and salt (sodium) can help lower blood pressure. Decreasing salt intake decreases blood pressure in a 1/3 of people.  Stop smoking if you are a smoker. The steps above are highly effective in reducing blood pressure. While these actions are easy to suggest, they are difficult to achieve. Most patients with moderate or severe hypertension end up requiring medications to bring their blood pressure down to a normal level. There are several classes of medications for treatment. Blood pressure pills (antihypertensives) will lower blood pressure by their different actions. Lowering the blood pressure by 10 mmHg may decrease the risk of complications by as much as 25%. The goal of treatment is effective blood pressure control. This will reduce your risk for complications. Your caregiver will help you determine the best treatment for you according to your lifestyle. What is excellent treatment for one person, may not be for you. HOME CARE INSTRUCTIONS   Do not smoke.  Follow the lifestyle changes outlined in the "Prevention" section.  If you are on medications, follow the directions carefully. Blood pressure medications must be taken as prescribed. Skipping doses reduces their  benefit. It also puts you at risk for problems.  Follow up with your caregiver, as directed.  If you are asked to monitor your blood pressure at home, follow the guidelines in the "Diagnosis" section above. SEEK MEDICAL CARE IF:   You think you are having medication side effects.  You have recurrent headaches or lightheadedness.  You have swelling in your ankles.  You have trouble with your vision. SEEK IMMEDIATE MEDICAL CARE IF:   You have sudden onset of chest pain or pressure, difficulty breathing, or other symptoms of a heart attack.  You have a severe headache.  You have symptoms of a stroke (such as sudden weakness, difficulty speaking, difficulty walking). MAKE SURE YOU:   Understand these instructions.  Will watch your condition.  Will get help right away if you are not doing well or get worse. Document Released: 06/24/2005 Document Revised: 09/16/2011 Document Reviewed: 01/22/2007 [facility] Patient Information 2014 [place].

## 2013-10-22 ENCOUNTER — Other Ambulatory Visit: Payer: Self-pay | Admitting: *Deleted

## 2013-10-22 ENCOUNTER — Ambulatory Visit (HOSPITAL_COMMUNITY): Admission: RE | Admit: 2013-10-22 | Payer: Self-pay | Source: Ambulatory Visit

## 2013-10-22 ENCOUNTER — Encounter: Payer: Self-pay | Admitting: Internal Medicine

## 2013-10-22 ENCOUNTER — Ambulatory Visit: Payer: Self-pay | Attending: Internal Medicine | Admitting: Internal Medicine

## 2013-10-22 VITALS — BP 200/120 | HR 64 | Temp 98.4°F | Resp 20

## 2013-10-22 DIAGNOSIS — I10 Essential (primary) hypertension: Secondary | ICD-10-CM

## 2013-10-22 MED ORDER — LISINOPRIL 40 MG PO TABS: 40.0000 mg | ORAL_TABLET | Freq: Every day | ORAL | Status: DC

## 2013-10-22 MED ORDER — HYDROCHLOROTHIAZIDE 25 MG PO TABS: 25.0000 mg | ORAL_TABLET | Freq: Two times a day (BID) | ORAL | Status: DC

## 2013-10-22 MED ORDER — CLONIDINE HCL 0.2 MG PO TABS: 0.2000 mg | ORAL_TABLET | Freq: Every day | ORAL | Status: DC

## 2013-10-22 MED ORDER — METOPROLOL TARTRATE 50 MG PO TABS: 50.0000 mg | ORAL_TABLET | Freq: Two times a day (BID) | ORAL | Status: DC

## 2013-10-22 MED ORDER — CLONIDINE HCL 0.1 MG PO TABS
0.2000 mg | ORAL_TABLET | Freq: Once | ORAL | Status: AC
  Administered 2013-10-22: 0.2 mg via ORAL

## 2013-10-22 MED ORDER — AMLODIPINE BESYLATE 10 MG PO TABS: 10.0000 mg | ORAL_TABLET | Freq: Every day | ORAL | Status: DC

## 2013-10-22 MED ORDER — CLONIDINE HCL 0.1 MG PO TABS: 0.2000 mg | ORAL_TABLET | Freq: Once | ORAL | Status: DC

## 2013-10-22 MED ORDER — METOPROLOL SUCCINATE ER 100 MG PO TB24: 100.0000 mg | ORAL_TABLET | Freq: Every day | ORAL | Status: DC

## 2013-10-22 NOTE — Progress Notes (Signed)
Patient ID: Lori Bush, female   DOB: [birth year], 53 y.o.   MRN: [id number]   CC:  HPI: 53 year old female with history of hypertension, seen for accelerated hypertension several times in our office as well as the ER who was in the ER yesterday as well as on 4/10 for accelerated hypertension and headache. Blood pressure was greater than 902 systolic. Patient states that she has been taking clonidine lisinopril metoprolol and Norvasc. She ran out of her lisinopril and Norvasc 2 weeks ago, since then her headache has been worse.  She never started the hydrochlorothiazide. The clonidine only once at bedtime because it makes her sleepy during the day. She denies any chest pain and shortness of breath She has been taking Advil for her headaches. Patient denies any dizziness any episodes of vomiting or epigastric pain. Lab work yesterday was reassuring She also had a CT of the head which was negative.. According to the patient she was recommended to be admitted by the ER physician but after receiving labetalol and hydralazine the patient's blood pressure did come down and she was discharged from the ED.      No Known Allergies Past Medical History  Diagnosis Date  . Hypertension    Current Outpatient Prescriptions on File Prior to Visit  Medication Sig Dispense Refill  . metroNIDAZOLE (FLAGYL) 500 MG tablet Take 1 tablet (500 mg total) by mouth 2 (two) times daily.  14 tablet  0  . diphenhydrAMINE (BENADRYL) 25 MG tablet Take 2 tablets (50 mg total) by mouth every 4 (four) hours as needed for itching.  20 tablet  0  . fluticasone (FLONASE) 50 MCG/ACT nasal spray Place 2 sprays into both nostrils daily.  16 g  6  . metoCLOPramide (REGLAN) 10 MG tablet Take 1 tablet (10 mg total) by mouth every 6 (six) hours as needed for nausea (nausea/headache).  6 tablet  0  . Multiple Vitamin (MULTIVITAMIN WITH MINERALS) TABS Take 1 tablet by mouth daily.  30 tablet  3   No current  facility-administered medications on file prior to visit.   History reviewed. No pertinent family history. History   Social History  . Marital Status: Single    Spouse Name: N/A    Number of Children: N/A  . Years of Education: N/A   Occupational History  . Not on file.   Social History Main Topics  . Smoking status: Current Every Day Smoker  . Smokeless tobacco: Not on file  . Alcohol Use: No  . Drug Use: No  . Sexual Activity:    Other Topics Concern  . Not on file   Social History Narrative  . No narrative on file    Review of Systems  Constitutional: Negative for fever, chills, diaphoresis, activity change, appetite change and fatigue.  HENT: Negative for ear pain, nosebleeds, congestion, facial swelling, rhinorrhea, neck pain, neck stiffness and ear discharge.   Eyes: Negative for pain, discharge, redness, itching and visual disturbance.  Respiratory: Negative for cough, choking, chest tightness, shortness of breath, wheezing and stridor.   Cardiovascular: As in history of present illness Gastrointestinal: Negative for abdominal distention.  Genitourinary: Negative for dysuria, urgency, frequency, hematuria, flank pain, decreased urine volume, difficulty urinating and dyspareunia.  Musculoskeletal: Negative for back pain, joint swelling, arthralgias and gait problem.  Neurological: Negative for dizziness, tremors, seizures, syncope, facial asymmetry, speech difficulty, weakness, light-headedness, numbness and headaches.  Hematological: Negative for adenopathy. Does not bruise/bleed easily.  Psychiatric/Behavioral: Negative for hallucinations, behavioral problems, confusion, dysphoric  mood, decreased concentration and agitation.    Objective:   Filed Vitals:   10/22/13 1000  BP: 232/135  Resp: 20    Physical Exam  Constitutional: Appears well-developed and well-nourished. No distress.  HENT: Normocephalic. External right and left ear normal. Oropharynx is clear  and moist.  Eyes: Conjunctivae and EOM are normal. PERRLA, no scleral icterus.  Neck: Normal ROM. Neck supple. No JVD. No tracheal deviation. No thyromegaly.  CVS: RRR, S1/S2 +, no murmurs, no gallops, no carotid bruit.  Pulmonary: Effort and breath sounds normal, no stridor, rhonchi, wheezes, rales.  Abdominal: Soft. BS +,  no distension, tenderness, rebound or guarding.  Musculoskeletal: Normal range of motion. No edema and no tenderness.  Lymphadenopathy: No lymphadenopathy noted, cervical, inguinal. Neuro: Alert. Normal reflexes, muscle tone coordination. No cranial nerve deficit. Skin: Skin is warm and dry. No rash noted. Not diaphoretic. No erythema. No pallor.  Psychiatric: Normal mood and affect. Behavior, judgment, thought content normal.   Lab Results  Component Value Date   WBC 6.4 10/21/2013   HGB 14.7 10/21/2013   HCT 42.1 10/21/2013   MCV 84.9 10/21/2013   PLT 238 10/21/2013   Lab Results  Component Value Date   CREATININE 0.96 10/21/2013   BUN 16 10/21/2013   NA 141 10/21/2013   K 3.7 10/21/2013   CL 103 10/21/2013   CO2 26 10/21/2013    Lab Results  Component Value Date   HGBA1C 5.2 09/30/2013   Lipid Panel     Component Value Date/Time   CHOL 159 09/30/2013 1509   TRIG 127 09/30/2013 1509   HDL 53 09/30/2013 1509   CHOLHDL 3.0 09/30/2013 1509   VLDL 25 09/30/2013 1509   LDLCALC 81 09/30/2013 1509       Assessment and plan:   Patient Active Problem List   Diagnosis Date Noted  . Accelerated hypertension 03/05/2013   Accelerated hypertension Patient did not take her clonidine and metoprolol this morning She ran out of lisinopril and Norvasc 2 weeks ago. She was here on 3/26 to get a refill on her blood pressure medication but could not I do not see any documentation of this Patient will be recommended to take metoprolol, hydrochlorothiazide, lisinopril, Norvasc for hypertension The dose of her clonidine is so small that the patient should probably discontinue  it She has been advised to stop taking NSAIDs She should only take Tylenol as needed for headache Workup in the ER was reassuring yesterday She was given 0.2 mg of clonidine today We'll monitor her for another 30 minutes the blood pressure starts to come down the patient will be discharged home to refill her prescriptions as soon as possible  She will be seen back in 4 weeks for a blood pressure check       The patient was given clear instructions to go to ER or return to medical center if symptoms don't improve, worsen or new problems develop. The patient verbalized understanding. The patient was told to call to get any lab results if not heard anything in the next week.

## 2013-10-22 NOTE — Addendum Note (Signed)
Addended by: Theodis Shove on: 10/22/2013 10:32 AM   Modules accepted: Orders

## 2013-10-22 NOTE — Addendum Note (Signed)
Addended by: Allyson Sabal MD, Ascencion Dike on: 10/22/2013 11:46 AM   Modules accepted: Orders, Medications

## 2013-10-22 NOTE — Progress Notes (Addendum)
Patient walked in to clinic with headache, right shoulder pain. Seen in the ED last night for hypertension, vomiting, headache. Hypertensive-232/135. Dr. Allyson Sabal notified. She indicated she would see patient. 0.2 mg of clonidine given. Patient requests refills of her blood pressure medication.  VS rechecked-BP 200/120. Discussed with Dr. Allyson Sabal who indicated patient okay to discharge. Scripts given to patient.

## 2013-10-25 ENCOUNTER — Ambulatory Visit (HOSPITAL_COMMUNITY): Admission: RE | Admit: 2013-10-25 | Payer: Self-pay | Source: Ambulatory Visit

## 2013-11-01 ENCOUNTER — Telehealth: Payer: Self-pay

## 2013-11-01 NOTE — Telephone Encounter (Signed)
Lori Bush from the health dept called Need clarification on HCTZ dose and her metoprolol Should she be taking the HCTZ twice a day? She used to be on metoprolol tartrate twice a day

## 2013-11-01 NOTE — Telephone Encounter (Signed)
Please call back and inform that patient should be taking HCTZ 25 mg twice daily and meteprolol succinate (toprol XL) 100 mg once daily. Dr. Allyson Sabal d/c'ed the regular meteprolol that was twice daily and gave her a Rx for the long acting daily dose. Thanks

## 2013-11-05 ENCOUNTER — Ambulatory Visit: Payer: Self-pay

## 2013-11-23 ENCOUNTER — Telehealth: Payer: Self-pay

## 2013-11-23 NOTE — Telephone Encounter (Signed)
Crystal from the health department calling to clarify orders on this patient Should she be taking HCTZ BID? Please advise

## 2013-11-23 NOTE — Telephone Encounter (Signed)
Dr. Allyson Sabal did write for patient to take HCTZ 25 mg BID. She should keep what Dr. Allyson Sabal wrote for until follow up appt. She should have a f/u visit with nurse for BP check

## 2013-11-25 ENCOUNTER — Telehealth: Payer: Self-pay

## 2013-11-25 NOTE — Telephone Encounter (Signed)
Spoke with Dr Doreene Burke to clarify metoprolol order and HCTZ The correct order will be HCTZ 25 mg daily Metoprolol XL 100 mg daily Will call patient to come in tomorrow for blood pressure check

## 2013-11-25 NOTE — Telephone Encounter (Signed)
Spoke with patient about rechecking her blood pressure Patient will be in tomorrow 5/22 for a re check of her blood pressure

## 2013-11-25 NOTE — Telephone Encounter (Signed)
Spoke with cyrstal in the health dept Clarified the patient order for HCTZ

## 2013-12-31 ENCOUNTER — Ambulatory Visit: Payer: Self-pay | Admitting: Internal Medicine

## 2014-02-10 ENCOUNTER — Emergency Department (HOSPITAL_COMMUNITY)
Admission: EM | Admit: 2014-02-10 | Discharge: 2014-02-10 | Disposition: A | Discharge status: Home / Self Care | Payer: Self-pay | Attending: Emergency Medicine | Admitting: Emergency Medicine

## 2014-02-10 ENCOUNTER — Encounter (HOSPITAL_COMMUNITY): Payer: Self-pay | Admitting: Emergency Medicine

## 2014-02-10 ENCOUNTER — Emergency Department (HOSPITAL_COMMUNITY): Payer: Self-pay

## 2014-02-10 DIAGNOSIS — R197 Diarrhea, unspecified: Secondary | ICD-10-CM | POA: Insufficient documentation

## 2014-02-10 DIAGNOSIS — M129 Arthropathy, unspecified: Secondary | ICD-10-CM | POA: Insufficient documentation

## 2014-02-10 DIAGNOSIS — F17200 Nicotine dependence, unspecified, uncomplicated: Secondary | ICD-10-CM | POA: Insufficient documentation

## 2014-02-10 DIAGNOSIS — R69 Illness, unspecified: Secondary | ICD-10-CM

## 2014-02-10 DIAGNOSIS — Z79899 Other long term (current) drug therapy: Secondary | ICD-10-CM | POA: Insufficient documentation

## 2014-02-10 DIAGNOSIS — I498 Other specified cardiac arrhythmias: Secondary | ICD-10-CM | POA: Insufficient documentation

## 2014-02-10 DIAGNOSIS — R52 Pain, unspecified: Secondary | ICD-10-CM | POA: Insufficient documentation

## 2014-02-10 DIAGNOSIS — R1110 Vomiting, unspecified: Secondary | ICD-10-CM | POA: Insufficient documentation

## 2014-02-10 DIAGNOSIS — J111 Influenza due to unidentified influenza virus with other respiratory manifestations: Secondary | ICD-10-CM

## 2014-02-10 DIAGNOSIS — I10 Essential (primary) hypertension: Secondary | ICD-10-CM | POA: Insufficient documentation

## 2014-02-10 HISTORY — DX: Unspecified osteoarthritis, unspecified site: M19.90

## 2014-02-10 LAB — URINALYSIS, ROUTINE W REFLEX MICROSCOPIC
Bilirubin Urine: NEGATIVE
GLUCOSE, UA: NEGATIVE mg/dL
Hgb urine dipstick: NEGATIVE
KETONES UR: NEGATIVE mg/dL
LEUKOCYTES UA: NEGATIVE
Nitrite: NEGATIVE
Protein, ur: NEGATIVE mg/dL
Specific Gravity, Urine: 1.008 (ref 1.005–1.030)
Urobilinogen, UA: 1 mg/dL (ref 0.0–1.0)
pH: 7 (ref 5.0–8.0)

## 2014-02-10 LAB — BASIC METABOLIC PANEL
Anion gap: 11 (ref 5–15)
BUN: 12 mg/dL (ref 6–23)
CHLORIDE: 106 meq/L (ref 96–112)
CO2: 26 meq/L (ref 19–32)
Calcium: 10.1 mg/dL (ref 8.4–10.5)
Creatinine, Ser: 0.8 mg/dL (ref 0.50–1.10)
GFR calc Af Amer: >90 mL/min (ref >90)
GFR, EST NON AFRICAN AMERICAN: 83 mL/min — AB (ref >90)
GLUCOSE: 92 mg/dL (ref 70–99)
POTASSIUM: 3.6 meq/L — AB (ref 3.7–5.3)
SODIUM: 143 meq/L (ref 137–147)

## 2014-02-10 LAB — HEPATIC FUNCTION PANEL
ALBUMIN: 3.5 g/dL (ref 3.5–5.2)
ALK PHOS: 64 U/L (ref 39–117)
ALT: 24 U/L (ref 0–35)
AST: 27 U/L (ref 0–37)
Bilirubin, Direct: <0.2 mg/dL (ref 0.0–0.3)
TOTAL PROTEIN: 6.7 g/dL (ref 6.0–8.3)
Total Bilirubin: 0.4 mg/dL (ref 0.3–1.2)

## 2014-02-10 LAB — LACTIC ACID, PLASMA: LACTIC ACID, VENOUS: 0.7 mmol/L (ref 0.5–2.2)

## 2014-02-10 LAB — CBC
HCT: 43 % (ref 36.0–46.0)
HEMOGLOBIN: 15 g/dL (ref 12.0–15.0)
MCH: 30.9 pg (ref 26.0–34.0)
MCHC: 34.9 g/dL (ref 30.0–36.0)
MCV: 88.7 fL (ref 78.0–100.0)
Platelets: 241 10*3/uL (ref 150–400)
RBC: 4.85 MIL/uL (ref 3.87–5.11)
RDW: 13.3 % (ref 11.5–15.5)
WBC: 7 10*3/uL (ref 4.0–10.5)

## 2014-02-10 LAB — LACTATE DEHYDROGENASE: LDH: 230 U/L (ref 94–250)

## 2014-02-10 LAB — RAPID HIV SCREEN (WH-MAU): SUDS RAPID HIV SCREEN: NONREACTIVE

## 2014-02-10 MED ORDER — DOXYCYCLINE HYCLATE 100 MG PO CAPS
100.0000 mg | ORAL_CAPSULE | Freq: Two times a day (BID) | ORAL | Status: DC
Start: 2014-02-10 — End: 2014-03-26

## 2014-02-10 MED ORDER — HYDROCODONE-ACETAMINOPHEN 7.5-325 MG/15ML PO SOLN
10.0000 mL | Freq: Once | ORAL | Status: AC
  Administered 2014-02-10: 10 mL via ORAL
  Filled 2014-02-10: qty 15

## 2014-02-10 MED ORDER — DOXYCYCLINE HYCLATE 100 MG PO TABS
100.0000 mg | ORAL_TABLET | Freq: Once | ORAL | Status: AC
  Administered 2014-02-10: 100 mg via ORAL
  Filled 2014-02-10: qty 1

## 2014-02-10 MED ORDER — SODIUM CHLORIDE 0.9 % IV BOLUS (SEPSIS)
1000.0000 mL | Freq: Once | INTRAVENOUS | Status: AC
  Administered 2014-02-10: 1000 mL via INTRAVENOUS

## 2014-02-10 MED ORDER — PROMETHAZINE HCL 25 MG PO TABS: 25.0000 mg | ORAL_TABLET | Freq: Four times a day (QID) | ORAL | Status: DC | PRN

## 2014-02-10 MED ORDER — HYDROCODONE-ACETAMINOPHEN 7.5-325 MG/15ML PO SOLN: 10.0000 mL | Freq: Four times a day (QID) | ORAL | Status: DC | PRN

## 2014-02-10 MED ORDER — ONDANSETRON HCL 4 MG/2ML IJ SOLN
4.0000 mg | Freq: Once | INTRAMUSCULAR | Status: AC
  Administered 2014-02-10: 4 mg via INTRAVENOUS
  Filled 2014-02-10: qty 2

## 2014-02-10 NOTE — ED Provider Notes (Signed)
Medical screening examination/treatment/procedure(s) were performed by non-physician practitioner and as supervising physician I was immediately available for consultation/collaboration.   EKG Interpretation None        Evelina Bucy, MD 02/10/14 [phone]

## 2014-02-10 NOTE — Discharge Instructions (Signed)
°  Take Hycet  for cough and pain. Do not drink alcohol, drive, care for children or do other critical tasks while taking Hycet.   Take your antibiotics as directed and to completion. You should never have any leftover antibiotics! Push fluids and stay well hydrated.   Please follow with your primary care doctor in the next 2 days for a check-up. They must obtain records for further management.   Do not hesitate to return to the Emergency Department for any new, worsening or concerning symptoms.

## 2014-02-10 NOTE — ED Notes (Signed)
Pt from home with c/o generalized body aches, diarrhea, vomiting, cough, sore throat, and chills for the past week.  Pt also reports right shoulder pain, hx of arthritis. Pt ambulatory, NAD, A&O.

## 2014-02-10 NOTE — ED Provider Notes (Signed)
CSN: [id number]     Arrival date & time 02/10/14  0803 History   First MD Initiated Contact with Patient 02/10/14 [phone]     Chief Complaint  Patient presents with  . Emesis  . Diarrhea  . Generalized Body Aches     (Consider location/radiation/quality/duration/timing/severity/associated sxs/prior Treatment) HPI  Naeema Patlan is a 53 y.o. female with PMH of accelerated HTN complaining of one week of flulike symptoms. Patient reports 3 episodes of nonbloody, nonbilious, non- coffee ground emesis in the last 24 hours. MAXIMUM TEMPERATURE of 100. Associated symptoms of dry cough, diffuse myalgia, 5/10 frontal headache, nasal congestion and loose stool (had 2 episodes yesterday non-melanotic, greasy or green). Symptoms have been ongoing for 7 days and neither worsening nor improving. She's been taking acetaminophen which relieves the headache, she's been taking Tylenol Cold and flu which helps her sleep at night. She denies sick contacts, rash, tick bite, chest pain, shortness of breath, dysuria, hematuria. Patient states that she has chronic, unchanged right shoulder and neck pain which she attributes to arthritis. Patient states  25 pound unintentional weight loss over 2 weeks, occuring 3 months ago. She endorses easy bruising and night sweats.. States that her primary care physician is aware of the weight loss.  Past Medical History  Diagnosis Date  . Hypertension   . Arthritis    Past Surgical History  Procedure Laterality Date  . Cesarean section    . Wisdom tooth extraction    . Hernia repair     History reviewed. No pertinent family history. History  Substance Use Topics  . Smoking status: Current Every Day Smoker -- 0.50 packs/day for 31 years    Types: Cigarettes  . Smokeless tobacco: Never Used  . Alcohol Use: No   OB History   Grav Para Term Preterm Abortions TAB SAB Ect Mult Living                 Review of Systems  10 systems reviewed and found to be negative,  except as noted in the HPI.  Allergies  Review of patient's allergies indicates no known allergies.  Home Medications   Prior to Admission medications   Medication Sig Start Date End Date Taking? Authorizing Provider  amLODipine (NORVASC) 10 MG tablet Take 1 tablet (10 mg total) by mouth daily. 10/22/13  Yes Reyne Dumas, MD  cloNIDine (CATAPRES) 0.2 MG tablet Take 1 tablet (0.2 mg total) by mouth daily after supper. 10/22/13  Yes Reyne Dumas, MD  diphenhydrAMINE (BENADRYL) 25 MG tablet Take 2 tablets (50 mg total) by mouth every 4 (four) hours as needed for itching. 10/21/13  Yes Mariea Clonts, MD  hydrochlorothiazide (HYDRODIURIL) 25 MG tablet Take 25 mg by mouth daily.   Yes Historical Provider, MD  lisinopril (PRINIVIL,ZESTRIL) 40 MG tablet Take 1 tablet (40 mg total) by mouth daily. 10/22/13  Yes Reyne Dumas, MD  metoprolol (LOPRESSOR) 50 MG tablet Take 50 mg by mouth daily.   Yes Historical Provider, MD  doxycycline (VIBRAMYCIN) 100 MG capsule Take 1 capsule (100 mg total) by mouth 2 (two) times daily. 02/10/14   Lilo Wallington, PA-C  HYDROcodone-acetaminophen (HYCET) 7.5-325 mg/15 ml solution Take 10 mLs by mouth 4 (four) times daily as needed for moderate pain. 02/10/14   [place] Rollyson, PA-C  promethazine (PHENERGAN) 25 MG tablet Take 1 tablet (25 mg total) by mouth every 6 (six) hours as needed for nausea or vomiting. 02/10/14   Sabrina Keough, PA-C   BP 130/80  Pulse  51  Temp(Src) 98.3 F (36.8 C) (Oral)  Resp 19  SpO2 97% Physical Exam  Nursing note and vitals reviewed. Constitutional: She is oriented to person, place, and time. She appears well-developed and well-nourished. No distress.  HENT:  Head: Normocephalic and atraumatic.  Mouth/Throat: Oropharynx is clear and moist.  No tenderness to palpation of maxillary or frontal sinuses.  His membranes are moist.  Posterior pharynx with mild injection. No tonsillar hypertrophy or exudate.  No anterior cervical  lymphadenopathy or tenderness palpation.  Eyes: Conjunctivae and EOM are normal. Pupils are equal, round, and reactive to light.  Neck: Normal range of motion. Neck supple.  FROM to C-spine. Pt can touch chin to chest without discomfort. No TTP of midline cervical spine.   Cardiovascular: Normal rate, regular rhythm and intact distal pulses.   Pulmonary/Chest: Effort normal and breath sounds normal. No stridor. No respiratory distress. She has no wheezes. She has no rales. She exhibits no tenderness.  Slightly reduced or movement in all fields, no wheezing. Patient is active daily smoker.  Abdominal: Soft. Bowel sounds are normal. She exhibits no distension and no mass. There is no tenderness. There is no rebound and no guarding.  Musculoskeletal: Normal range of motion. She exhibits no edema and no tenderness.  Neurological: She is alert and oriented to person, place, and time.  Skin: Skin is warm. No rash noted.  Psychiatric: She has a normal mood and affect.    ED Course  Procedures (including critical care time) Labs Review Labs Reviewed  BASIC METABOLIC PANEL - Abnormal; Notable for the following:    Potassium 3.6 (*)    GFR calc non Af Amer 83 (*)    All other components within normal limits  LACTIC ACID, PLASMA  CBC  HEPATIC FUNCTION PANEL  URINALYSIS, ROUTINE W REFLEX MICROSCOPIC  LACTATE DEHYDROGENASE  RAPID HIV SCREEN ([facility])  ROCKY MTN SPOTTED FVR AB, IGG-BLOOD  ROCKY MTN SPOTTED FVR AB, IGM-BLOOD    Imaging Review Dg Chest 2 View  02/10/2014   CLINICAL DATA:  Emesis.  Generalized body aches.  EXAM: CHEST  2 VIEW  COMPARISON:  10/15/2013  FINDINGS: Cardiac silhouette is normal in size. Aorta is mildly uncoiled. No mediastinal or hilar masses or evidence of adenopathy.  Clear lungs.  No pleural effusion or pneumothorax.  Bony thorax is intact.  IMPRESSION: No active cardiopulmonary disease.   Electronically Signed   By: Lajean Manes M.D.   On: 02/10/2014 10:28     EKG  Interpretation None      MDM   Final diagnoses:  Influenza-like illness    Filed Vitals:   02/10/14 1156 02/10/14 1159 02/10/14 1200 02/10/14 1230  BP: 141/84 141/84 137/84 130/80  Pulse: 47  46 51  Temp:      TempSrc:      Resp: 14 16 16 19   SpO2: 99% 98% 98% 97%    Medications  sodium chloride 0.9 % bolus 1,000 mL (0 mLs Intravenous Stopped 02/10/14 1147)  ondansetron (ZOFRAN) injection 4 mg (4 mg Intravenous Given 02/10/14 0938)  HYDROcodone-acetaminophen (HYCET) 7.5-325 mg/15 ml solution 10 mL (10 mLs Oral Given 02/10/14 0939)  doxycycline (VIBRA-TABS) tablet 100 mg (100 mg Oral Given 02/10/14 1317)    Psalms Olarte is a 53 y.o. female presenting with flulike symptoms for 7 days. Patient also reports a unintentional 25 pound weight loss and easy bruising with night sweats.  Patient with stable vital signs, clear lung sounds, heart is mildly bradycardic however I think  it is likely secondary to multiple hypertension medications she is on including beta blockers. Patient with no leukocytosis on white blood count, chemistries with no acute abnormality urinalysis normal, chest x-ray is normal. LDH is ordered out of concern for malignancy because patient reports weight loss and easy bruising with night sweats. This is returned normal. Rapid HIV is also negative. RMSF panel pending. I will treat her with doxycycline. I have explained to her the reasoning behind this. She said that she understands. She will follow with primary care.  Evaluation does not show pathology that would require ongoing emergent intervention or inpatient treatment. Pt is hemodynamically stable and mentating appropriately. Discussed findings and plan with patient/guardian, who agrees with care plan. All questions answered. Return precautions discussed and outpatient follow up given.   Discharge Medication List as of 02/10/2014 12:51 PM    START taking these medications   Details  doxycycline (VIBRAMYCIN) 100 MG  capsule Take 1 capsule (100 mg total) by mouth 2 (two) times daily., Starting 02/10/2014, Until Discontinued, Print    HYDROcodone-acetaminophen (HYCET) 7.5-325 mg/15 ml solution Take 10 mLs by mouth 4 (four) times daily as needed for moderate pain., Starting 02/10/2014, Until Discontinued, [employer], PA-C 02/10/14 [phone]

## 2014-02-11 LAB — ROCKY MTN SPOTTED FVR AB, IGM-BLOOD: RMSF IgM: 0.38 IV (ref 0.00–0.89)

## 2014-02-11 LAB — ROCKY MTN SPOTTED FVR AB, IGG-BLOOD: RMSF IGG: 0.18 IV

## 2014-03-26 ENCOUNTER — Emergency Department (HOSPITAL_COMMUNITY): Payer: BC Managed Care – PPO

## 2014-03-26 ENCOUNTER — Encounter (HOSPITAL_COMMUNITY): Payer: Self-pay | Admitting: Emergency Medicine

## 2014-03-26 ENCOUNTER — Emergency Department (HOSPITAL_COMMUNITY)
Admission: EM | Admit: 2014-03-26 | Discharge: 2014-03-26 | Disposition: A | Discharge status: Home / Self Care | Payer: BC Managed Care – PPO | Attending: Emergency Medicine | Admitting: Emergency Medicine

## 2014-03-26 DIAGNOSIS — F17200 Nicotine dependence, unspecified, uncomplicated: Secondary | ICD-10-CM | POA: Insufficient documentation

## 2014-03-26 DIAGNOSIS — R059 Cough, unspecified: Secondary | ICD-10-CM | POA: Diagnosis not present

## 2014-03-26 DIAGNOSIS — R05 Cough: Secondary | ICD-10-CM | POA: Insufficient documentation

## 2014-03-26 DIAGNOSIS — I10 Essential (primary) hypertension: Secondary | ICD-10-CM | POA: Diagnosis not present

## 2014-03-26 DIAGNOSIS — Z79899 Other long term (current) drug therapy: Secondary | ICD-10-CM | POA: Insufficient documentation

## 2014-03-26 DIAGNOSIS — R0602 Shortness of breath: Secondary | ICD-10-CM | POA: Diagnosis not present

## 2014-03-26 DIAGNOSIS — Z720 Tobacco use: Secondary | ICD-10-CM

## 2014-03-26 DIAGNOSIS — H538 Other visual disturbances: Secondary | ICD-10-CM | POA: Diagnosis not present

## 2014-03-26 DIAGNOSIS — M129 Arthropathy, unspecified: Secondary | ICD-10-CM | POA: Insufficient documentation

## 2014-03-26 LAB — CBC WITH DIFFERENTIAL/PLATELET
BASOS ABS: 0.1 10*3/uL (ref 0.0–0.1)
BASOS PCT: 1 % (ref 0–1)
Eosinophils Absolute: 0.6 10*3/uL (ref 0.0–0.7)
Eosinophils Relative: 7 % — ABNORMAL HIGH (ref 0–5)
HCT: 43.4 % (ref 36.0–46.0)
Hemoglobin: 15.3 g/dL — ABNORMAL HIGH (ref 12.0–15.0)
Lymphocytes Relative: 44 % (ref 12–46)
Lymphs Abs: 3.5 10*3/uL (ref 0.7–4.0)
MCH: 30.3 pg (ref 26.0–34.0)
MCHC: 35.3 g/dL (ref 30.0–36.0)
MCV: 85.9 fL (ref 78.0–100.0)
Monocytes Absolute: 0.9 10*3/uL (ref 0.1–1.0)
Monocytes Relative: 11 % (ref 3–12)
NEUTROS ABS: 3 10*3/uL (ref 1.7–7.7)
NEUTROS PCT: 37 % — AB (ref 43–77)
Platelets: 251 10*3/uL (ref 150–400)
RBC: 5.05 MIL/uL (ref 3.87–5.11)
RDW: 13 % (ref 11.5–15.5)
WBC: 8 10*3/uL (ref 4.0–10.5)

## 2014-03-26 LAB — COMPREHENSIVE METABOLIC PANEL
ALBUMIN: 3.8 g/dL (ref 3.5–5.2)
ALK PHOS: 66 U/L (ref 39–117)
ALT: 31 U/L (ref 0–35)
AST: 36 U/L (ref 0–37)
Anion gap: 13 (ref 5–15)
BUN: 16 mg/dL (ref 6–23)
CHLORIDE: 104 meq/L (ref 96–112)
CO2: 25 mEq/L (ref 19–32)
Calcium: 10.2 mg/dL (ref 8.4–10.5)
Creatinine, Ser: 1.05 mg/dL (ref 0.50–1.10)
GFR calc Af Amer: 69 mL/min — ABNORMAL LOW (ref >90)
GFR calc non Af Amer: 60 mL/min — ABNORMAL LOW (ref >90)
Glucose, Bld: 90 mg/dL (ref 70–99)
POTASSIUM: 3.6 meq/L — AB (ref 3.7–5.3)
Sodium: 142 mEq/L (ref 137–147)
Total Bilirubin: 0.3 mg/dL (ref 0.3–1.2)
Total Protein: 7.2 g/dL (ref 6.0–8.3)

## 2014-03-26 MED ORDER — PREDNISONE 20 MG PO TABS: 40.0000 mg | ORAL_TABLET | Freq: Every day | ORAL | Status: DC

## 2014-03-26 MED ORDER — ALBUTEROL SULFATE HFA 108 (90 BASE) MCG/ACT IN AERS: 2.0000 | INHALATION_SPRAY | RESPIRATORY_TRACT | Status: DC | PRN

## 2014-03-26 MED ORDER — PREDNISONE 20 MG PO TABS
60.0000 mg | ORAL_TABLET | Freq: Once | ORAL | Status: AC
  Administered 2014-03-26: 60 mg via ORAL
  Filled 2014-03-26: qty 3

## 2014-03-26 MED ORDER — IPRATROPIUM-ALBUTEROL 0.5-2.5 (3) MG/3ML IN SOLN
3.0000 mL | Freq: Once | RESPIRATORY_TRACT | Status: AC
  Administered 2014-03-26: 3 mL via RESPIRATORY_TRACT
  Filled 2014-03-26: qty 3

## 2014-03-26 NOTE — ED Provider Notes (Signed)
CSN: [id number]     Arrival date & time 03/26/14  1917 History   First MD Initiated Contact with Patient 03/26/14 2012     Chief Complaint  Patient presents with  . Cough  . Shortness of Breath  . Blurred Vision    HPI Lori Bush is a 53 y.o. female with PMH of tobacco abuse presenting with persistent cough with productive yellow thick mucous for over a month. She reports increasing frequency. Patient also reports SOB after coughing episodes. No DOE or chest pain. Patient seen at [facility] yesterday and CXR with a touch of PNA. tx with Bactrim and mucinex. Patient presents today because she developed a fever. Temp 98.6 in ED. Patient is a current every day smoker. Patient notes a decrease in appetite and a 20Ib weight loss over the past month. No N/V/D or abdominal pain. No recent surgeries, trauma, no h/o DVT, PE, estrogen use, unilateral leg swelling or pain. Blurred vision started today and not associated with HA, slurred speech or weakness. Its described as decreased vision and needing to use her reading glasses. No double vision. Patient has not seen a opthamologist recently.   Past Medical History  Diagnosis Date  . Hypertension   . Arthritis    Past Surgical History  Procedure Laterality Date  . Cesarean section    . Wisdom tooth extraction    . Hernia repair     No family history on file. History  Substance Use Topics  . Smoking status: Current Every Day Smoker -- 0.50 packs/day for 31 years    Types: Cigarettes  . Smokeless tobacco: Never Used  . Alcohol Use: No   OB History   Grav Para Term Preterm Abortions TAB SAB Ect Mult Living                 Review of Systems  Constitutional: Positive for fever. Negative for chills.  HENT: Negative for congestion and rhinorrhea.   Eyes: Negative for visual disturbance.  Respiratory: Positive for cough and shortness of breath.   Cardiovascular: Negative for chest pain and palpitations.  Gastrointestinal: Negative for  nausea, vomiting and diarrhea.  Genitourinary: Negative for dysuria and hematuria.  Musculoskeletal: Negative for back pain and gait problem.  Skin: Negative for rash.  Neurological: Negative for weakness and headaches.      Allergies  Review of patient's allergies indicates no known allergies.  Home Medications   Prior to Admission medications   Medication Sig Start Date End Date Taking? Authorizing Provider  amLODipine (NORVASC) 10 MG tablet Take 1 tablet (10 mg total) by mouth daily. 10/22/13  Yes Reyne Dumas, MD  cloNIDine (CATAPRES) 0.2 MG tablet Take 1 tablet (0.2 mg total) by mouth daily after supper. 10/22/13  Yes Reyne Dumas, MD  dextromethorphan-guaiFENesin (MUCINEX DM) 30-600 MG per 12 hr tablet Take 1 tablet by mouth 2 (two) times daily. 14 day therapy patient began on 03/25/2014   Yes Historical Provider, MD  hydrochlorothiazide (HYDRODIURIL) 25 MG tablet Take 25 mg by mouth daily.   Yes Historical Provider, MD  lisinopril (PRINIVIL,ZESTRIL) 40 MG tablet Take 1 tablet (40 mg total) by mouth daily. 10/22/13  Yes Reyne Dumas, MD  Multiple Vitamin (MULTIVITAMIN WITH MINERALS) TABS tablet Take 1 tablet by mouth daily. ONE-A-DAY GUMMIES   Yes Historical Provider, MD  sulfamethoxazole-trimethoprim (BACTRIM DS) 800-160 MG per tablet Take 1 tablet by mouth 2 (two) times daily. 10 day therapy course patient began on 03/25/2014.   Yes Historical Provider, MD  albuterol (  PROVENTIL HFA;VENTOLIN HFA) 108 (90 BASE) MCG/ACT inhaler Inhale 2 puffs into the lungs every 4 (four) hours as needed for wheezing or shortness of breath. 03/26/14   Pura Spice, PA-C  predniSONE (DELTASONE) 20 MG tablet Take 2 tablets (40 mg total) by mouth daily. 03/26/14   Jordan Hawks L Luchiano Viscomi, PA-C   BP 158/63  Pulse 60  Temp(Src) 98.6 F (37 C) (Oral)  Resp 18  Ht 5' (1.524 m)  Wt 107 lb (48.535 kg)  BMI 20.90 kg/m2  SpO2 99% Physical Exam  Nursing note and vitals reviewed. Constitutional: She appears  well-developed and well-nourished. No distress.  HENT:  Head: Normocephalic and atraumatic.  Eyes: Conjunctivae and EOM are normal. Right eye exhibits no discharge. Left eye exhibits no discharge.  Cardiovascular: Normal rate, regular rhythm and normal heart sounds.   Pulmonary/Chest: Effort normal and breath sounds normal. No respiratory distress. She has no wheezes.  Diffuse moderate wheezing, with decreased air movement. No respiratory distress.  Abdominal: Soft. Bowel sounds are normal. She exhibits no distension. There is no tenderness.  Musculoskeletal:  No leg swelling or tenderness. Negative Homan's sign.  Neurological: She is alert. She exhibits normal muscle tone. Coordination normal.  Skin: Skin is warm and dry. She is not diaphoretic.    ED Course  Procedures (including critical care time) Labs Review Labs Reviewed  CBC WITH DIFFERENTIAL - Abnormal; Notable for the following:    Hemoglobin 15.3 (*)    Neutrophils Relative % 37 (*)    Eosinophils Relative 7 (*)    All other components within normal limits  COMPREHENSIVE METABOLIC PANEL - Abnormal; Notable for the following:    Potassium 3.6 (*)    GFR calc non Af Amer 60 (*)    GFR calc Af Amer 69 (*)    All other components within normal limits    Imaging Review Dg Chest 2 View  03/26/2014   CLINICAL DATA:  Chest pain, short of breath, fever  EXAM: CHEST  2 VIEW  COMPARISON:  Radiograph 02/10/2014  FINDINGS: Normal mediastinum and cardiac silhouette. Normal pulmonary vasculature. No evidence of effusion, infiltrate, or pneumothorax. No acute bony abnormality.  IMPRESSION: No acute cardiopulmonary process.   Electronically Signed   By: Suzy Bouchard M.D.   On: 03/26/2014 22:03     EKG Interpretation None     Meds given in ED:  Medications  ipratropium-albuterol (DUONEB) 0.5-2.5 (3) MG/3ML nebulizer solution 3 mL (3 mLs Nebulization Given 03/26/14 2127)  predniSONE (DELTASONE) tablet 60 mg (60 mg Oral Given  03/26/14 2231)    Discharge Medication List as of 03/26/2014 10:25 PM    START taking these medications   Details  albuterol (PROVENTIL HFA;VENTOLIN HFA) 108 (90 BASE) MCG/ACT inhaler Inhale 2 puffs into the lungs every 4 (four) hours as needed for wheezing or shortness of breath., Starting 03/26/2014, Until Discontinued, Print    predniSONE (DELTASONE) 20 MG tablet Take 2 tablets (40 mg total) by mouth daily., Starting 03/26/2014, Until Discontinued, Print          MDM   Final diagnoses:  Tobacco abuse  SOB (shortness of breath)  Cough   Patient with significant history of tobacco abuse who presents with one week of productive cough with SOB after coughing episode. I suspect she has underling COPD and never diagnosed. VSS, O2 99% patient diffuse wheezing with decreased air movement, no respiratory distress. CXR without infiltrates, effusion. Patients symptoms improved with duoneb and dose of prednisone in ED. Will treat as  COPD exacerbation. Clinical picture not consistent with PE. Patient already with bactrim from UC and encouraged to continue. Will provided script for prednisone x5 days and albuterol inhaler for SOB.  Follow up with primary care for weight loss and decrease in appetite. CXR without nodules today.  Discussed return precautions with patient. Discussed all results and patient verbalizes understanding and agrees with plan.  Case has been discussed with Dr. Venora Maples who agrees with the above plan to discharge.      Pura Spice, PA-C 03/27/14 [phone]

## 2014-03-26 NOTE — Discharge Instructions (Signed)
Return to the emergency room with worsening of symptoms, new symptoms or with symptoms that are concerning, especially fevers, nausea, vomiting, chest pain, shortness of breath. Coughing up blood. Please call your doctor for a followup appointment within 24-48 hours. When you talk to your doctor please let them know that you were seen in the emergency department and have them acquire all of your records so that they can discuss the findings with you and formulate a treatment plan to fully care for your new and ongoing problems.  Especially follow up weight loss and decreased appetite.  Continue to take prednisone, antibiotics and albuterol inhaler. Please take all of your antibiotics until finished!   You may develop abdominal discomfort or diarrhea from the antibiotic.  You may help offset this with probiotics which you can buy or get in yogurt. Do not eat  or take the probiotics until 2 hours after your antibiotic.    Chronic Obstructive Pulmonary Disease Chronic obstructive pulmonary disease (COPD) is a common lung condition in which airflow from the lungs is limited. COPD is a general term that can be used to describe many different lung problems that limit airflow, including both chronic bronchitis and emphysema. If you have COPD, your lung function will probably never return to normal, but there are measures you can take to improve lung function and make yourself feel better.  CAUSES   Smoking (common).   Exposure to secondhand smoke.   Genetic problems.  Chronic inflammatory lung diseases or recurrent infections. SYMPTOMS   Shortness of breath, especially with physical activity.   Deep, persistent (chronic) cough with a large amount of thick mucus.   Wheezing.   Rapid breaths (tachypnea).   Gray or bluish discoloration (cyanosis) of the skin, especially in fingers, toes, or lips.   Fatigue.   Weight loss.   Frequent infections or episodes when breathing symptoms  become much worse (exacerbations).   Chest tightness. DIAGNOSIS  Your health care provider will take a medical history and perform a physical examination to make the initial diagnosis. Additional tests for COPD may include:   Lung (pulmonary) function tests.  Chest X-ray.  CT scan.  Blood tests. TREATMENT  Treatment available to help you feel better when you have COPD includes:   Inhaler and nebulizer medicines. These help manage the symptoms of COPD and make your breathing more comfortable.  Supplemental oxygen. Supplemental oxygen is only helpful if you have a low oxygen level in your blood.   Exercise and physical activity. These are beneficial for nearly all people with COPD. Some people may also benefit from a pulmonary rehabilitation program. HOME CARE INSTRUCTIONS   Take all medicines (inhaled or pills) as directed by your health care provider.  Avoid over-the-counter medicines or cough syrups that dry up your airway (such as antihistamines) and slow down the elimination of secretions unless instructed otherwise by your health care provider.   If you are a smoker, the most important thing that you can do is stop smoking. Continuing to smoke will cause further lung damage and breathing trouble. Ask your health care provider for help with quitting smoking. He or she can direct you to community resources or hospitals that provide support.  Avoid exposure to irritants such as smoke, chemicals, and fumes that aggravate your breathing.  Use oxygen therapy and pulmonary rehabilitation if directed by your health care provider. If you require home oxygen therapy, ask your health care provider whether you should purchase a pulse oximeter to measure your  oxygen level at home.   Avoid contact with individuals who have a contagious illness.  Avoid extreme temperature and humidity changes.  Eat healthy foods. Eating smaller, more frequent meals and resting before meals may help you  maintain your strength.  Stay active, but balance activity with periods of rest. Exercise and physical activity will help you maintain your ability to do things you want to do.  Preventing infection and hospitalization is very important when you have COPD. Make sure to receive all the vaccines your health care provider recommends, especially the pneumococcal and influenza vaccines. Ask your health care provider whether you need a pneumonia vaccine.  Learn and use relaxation techniques to manage stress.  Learn and use controlled breathing techniques as directed by your health care provider. Controlled breathing techniques include:   Pursed lip breathing. Start by breathing in (inhaling) through your nose for 1 second. Then, purse your lips as if you were going to whistle and breathe out (exhale) through the pursed lips for 2 seconds.   Diaphragmatic breathing. Start by putting one hand on your abdomen just above your waist. Inhale slowly through your nose. The hand on your abdomen should move out. Then purse your lips and exhale slowly. You should be able to feel the hand on your abdomen moving in as you exhale.   Learn and use controlled coughing to clear mucus from your lungs. Controlled coughing is a series of short, progressive coughs. The steps of controlled coughing are:  1. Lean your head slightly forward.  2. Breathe in deeply using diaphragmatic breathing.  3. Try to hold your breath for 3 seconds.  4. Keep your mouth slightly open while coughing twice.  5. Spit any mucus out into a tissue.  6. Rest and repeat the steps once or twice as needed. SEEK MEDICAL CARE IF:   You are coughing up more mucus than usual.   There is a change in the color or thickness of your mucus.   Your breathing is more labored than usual.   Your breathing is faster than usual.  SEEK IMMEDIATE MEDICAL CARE IF:   You have shortness of breath while you are resting.   You have shortness of  breath that prevents you from:  Being able to talk.   Performing your usual physical activities.   You have chest pain lasting longer than 5 minutes.   Your skin color is more cyanotic than usual.  You measure low oxygen saturations for longer than 5 minutes with a pulse oximeter. MAKE SURE YOU:   Understand these instructions.  Will watch your condition.  Will get help right away if you are not doing well or get worse. Document Released: 04/03/2005 Document Revised: 11/08/2013 Document Reviewed: 02/18/2013 [facility] Patient Information 2015 [place], [place]. This information is not intended to replace advice given to you by your health care provider. Make sure you discuss any questions you have with your health care provider.

## 2014-03-26 NOTE — ED Notes (Signed)
Pt presents with c/o cough and shortness of breath. Pt was seen at Urgent Care yesterday and told that she had a touch of pneumonia. Pt was given antibiotics and mucinex. Pt reports she was told to come here if she had a fever and she had one at home. No fever recorded at this time here. Pt also reporting some blurred vision.

## 2014-03-27 NOTE — ED Provider Notes (Signed)
Medical screening examination/treatment/procedure(s) were performed by non-physician practitioner and as supervising physician I was immediately available for consultation/collaboration.   EKG Interpretation None        Hoy Morn, MD 03/27/14 [phone]

## 2014-04-05 ENCOUNTER — Ambulatory Visit: Payer: BC Managed Care – PPO | Attending: Family Medicine | Admitting: Family Medicine

## 2014-04-05 ENCOUNTER — Encounter: Payer: Self-pay | Admitting: Family Medicine

## 2014-04-05 VITALS — BP 149/89 | HR 70 | Temp 98.4°F | Resp 18 | Ht 60.0 in | Wt 110.0 lb

## 2014-04-05 DIAGNOSIS — R634 Abnormal weight loss: Secondary | ICD-10-CM

## 2014-04-05 DIAGNOSIS — I10 Essential (primary) hypertension: Secondary | ICD-10-CM

## 2014-04-05 DIAGNOSIS — Z91199 Patient's noncompliance with other medical treatment and regimen due to unspecified reason: Secondary | ICD-10-CM | POA: Insufficient documentation

## 2014-04-05 DIAGNOSIS — IMO0002 Reserved for concepts with insufficient information to code with codable children: Secondary | ICD-10-CM | POA: Insufficient documentation

## 2014-04-05 DIAGNOSIS — F17200 Nicotine dependence, unspecified, uncomplicated: Secondary | ICD-10-CM | POA: Diagnosis not present

## 2014-04-05 DIAGNOSIS — Z9119 Patient's noncompliance with other medical treatment and regimen: Secondary | ICD-10-CM | POA: Insufficient documentation

## 2014-04-05 DIAGNOSIS — Z23 Encounter for immunization: Secondary | ICD-10-CM

## 2014-04-05 LAB — TSH: TSH: 2.433 u[IU]/mL (ref 0.350–4.500)

## 2014-04-05 MED ORDER — CLONIDINE HCL 0.2 MG PO TABS: 0.2000 mg | ORAL_TABLET | Freq: Every day | ORAL | Status: DC

## 2014-04-05 MED ORDER — LISINOPRIL 40 MG PO TABS
40.0000 mg | ORAL_TABLET | Freq: Every day | ORAL | Status: DC
Start: 2014-04-05 — End: 2015-08-08

## 2014-04-05 MED ORDER — ENSURE PO LIQD: 1.0000 | Freq: Two times a day (BID) | ORAL | Status: DC

## 2014-04-05 MED ORDER — AMLODIPINE BESYLATE 10 MG PO TABS: 10.0000 mg | ORAL_TABLET | Freq: Every day | ORAL | Status: DC

## 2014-04-05 MED ORDER — HYDROCHLOROTHIAZIDE 25 MG PO TABS: 25.0000 mg | ORAL_TABLET | Freq: Every day | ORAL | Status: DC

## 2014-04-05 MED ORDER — LISINOPRIL 40 MG PO TABS: 40.0000 mg | ORAL_TABLET | Freq: Every day | ORAL | Status: DC

## 2014-04-05 NOTE — Progress Notes (Signed)
   Subjective:    Patient ID: Lori Bush, female    DOB: 08-Jun-1961, 53 y.o.   MRN: [id number] CC: ED for bronchitis, weight loss   HPI 53 year old female presents for follow visit discussed the following:  1. bronchitis: Patient recently seen in the ED on 03/26/2014 with bronchitis. Treated with oral antibiotics and steroids. She denies chest pain shortness of breath. She has no fever. Chest x-ray in the ED was negative. Patient is a smoker she smokes half a pack per day.  2.  Unintentional weight loss: Patient with unintentional weight loss. She admits to hot flashes. She denies fever. She is a smoker. She had anorexia with nausea during her recent bronchitis. There is improving. She denies emesis, constipation, diarrhea. She reports she is up-to-date on all recommend screening. She reports her last colonoscopy was 6 years ago. Last report is not in the system. Patient to sign release of medical permission form so I can obtain her colonoscopy report.  3. Hypertension: Patient chronic hypertension. She has not taken her blood pressure medications today. She's out of two out of four blood pressure medications. Review of systems as per above.  Soc hx: current smoker 1/2  PPD  Review of Systems As per HPI      Objective:   Physical Exam BP 149/89  Pulse 70  Temp(Src) 98.4 F (36.9 C) (Oral)  Resp 18  Ht 5' (1.524 m)  Wt 110 lb (49.896 kg)  BMI 21.48 kg/m2  SpO2 99% Wt Readings from Last 3 Encounters:  04/05/14 110 lb (49.896 kg)  03/26/14 107 lb (48.535 kg)  09/30/13 123 lb (55.792 kg)  General appearance: alert, cooperative and no distress, thin, black female  Neck: no adenopathy, supple, symmetrical, trachea midline and thyroid not enlarged, symmetric, no tenderness/mass/nodules Lungs: clear to auscultation bilaterally Heart: regular rate and rhythm, S1, S2 normal, no murmur, click, rub or gallop Extremities: extremities normal, atraumatic, no cyanosis or edema         Assessment & Plan:

## 2014-04-05 NOTE — Progress Notes (Signed)
HFU Pneumonia Pt stated loosing wt. Lost 30 lb  in thepast month

## 2014-04-05 NOTE — Assessment & Plan Note (Signed)
A: 10 pound unintentional weight loss over the past 6 months. No red flags on review of systems or exam to suggest malignancy. P: Check TSH Obtain colonoscopy report Ensure between meals

## 2014-04-05 NOTE — Patient Instructions (Signed)
Lori Bush,   Thank you for coming in today. It was a pleasure meeting you. I look forward to being your primary doctor.   1. For hypertension: Continue BP meds. Smoking cessation support: smoking cessation hotline: 1-800-QUIT-NOW.  Smoking cessation classes are available through [facility]. Call [phone] or visit our website at [email or web address]   2. For weight loss, no red flags on exam you are up on all recommended screening.  Checking TSH Continue ensure We will get your colonoscopy report.   F/u with RN in 2 weeks for BP check  F/u with me in 6 weeks   Dr. Adrian Blackwater

## 2014-04-05 NOTE — Assessment & Plan Note (Signed)
A: BP near goal.  Meds: non compliant P: Refilled meds rn f/u in 2 weeks PCP f/u in 6 weeks

## 2014-04-12 ENCOUNTER — Telehealth: Payer: Self-pay | Admitting: *Deleted

## 2014-04-12 NOTE — Telephone Encounter (Signed)
Left message with normal labs, if any question return call

## 2014-04-12 NOTE — Telephone Encounter (Signed)
Message copied by Betti Cruz on Tue Apr 12, 2014 12:02 PM ------      Message from: Boykin Nearing      Created: Wed Apr 06, 2014  9:01 AM       Normal TSH ------

## 2014-04-12 NOTE — Telephone Encounter (Signed)
Pt stated has Flu Sx, pt aware of resting plenty of fluid if not better make an apt for F/w

## 2014-04-14 ENCOUNTER — Telehealth: Payer: Self-pay | Admitting: Family Medicine

## 2014-04-14 ENCOUNTER — Encounter: Payer: Self-pay | Admitting: Family Medicine

## 2014-04-14 DIAGNOSIS — F17210 Nicotine dependence, cigarettes, uncomplicated: Secondary | ICD-10-CM | POA: Insufficient documentation

## 2014-04-14 MED ORDER — SENNOSIDES-DOCUSATE SODIUM 8.6-50 MG PO TABS: 2.0000 | ORAL_TABLET | Freq: Every evening | ORAL | Status: DC | PRN

## 2014-04-14 NOTE — Telephone Encounter (Signed)
Fever started on Monday night 102.1 F Other symptoms: SOB, productive cough, L sided chest pains.  No body aches or chills.  Temp today is 98.8 F.  Treatments: tylenol 1000 mg once daily.  Feeling ok, just with SOB and belching. No chest pain. Constipated for past 5 days. Albuterol helps.   Plan stool softener. Sent to onsite pharmacy.  Reviewed s/s to prompt f/u appt return of temp, persistent chest pain.

## 2014-04-14 NOTE — Telephone Encounter (Signed)
Pt. States that she is not feeling well, pt. States that her temp. Has been going up and down, highest is 101.the patient also states that she is having SOB and trouble breathing. Please f/u with pt.

## 2014-04-16 ENCOUNTER — Emergency Department (HOSPITAL_COMMUNITY): Payer: BC Managed Care – PPO

## 2014-04-16 ENCOUNTER — Encounter (HOSPITAL_COMMUNITY): Payer: Self-pay | Admitting: Emergency Medicine

## 2014-04-16 DIAGNOSIS — Z8739 Personal history of other diseases of the musculoskeletal system and connective tissue: Secondary | ICD-10-CM | POA: Insufficient documentation

## 2014-04-16 DIAGNOSIS — Z79899 Other long term (current) drug therapy: Secondary | ICD-10-CM | POA: Insufficient documentation

## 2014-04-16 DIAGNOSIS — I10 Essential (primary) hypertension: Secondary | ICD-10-CM | POA: Insufficient documentation

## 2014-04-16 DIAGNOSIS — R079 Chest pain, unspecified: Secondary | ICD-10-CM | POA: Insufficient documentation

## 2014-04-16 DIAGNOSIS — Z720 Tobacco use: Secondary | ICD-10-CM | POA: Diagnosis not present

## 2014-04-16 LAB — I-STAT TROPONIN, ED: Troponin i, poc: 0 ng/mL (ref 0.00–0.08)

## 2014-04-16 LAB — CBC
HCT: 39.6 % (ref 36.0–46.0)
HEMOGLOBIN: 13.6 g/dL (ref 12.0–15.0)
MCH: 29.8 pg (ref 26.0–34.0)
MCHC: 34.3 g/dL (ref 30.0–36.0)
MCV: 86.8 fL (ref 78.0–100.0)
Platelets: 257 10*3/uL (ref 150–400)
RBC: 4.56 MIL/uL (ref 3.87–5.11)
RDW: 13 % (ref 11.5–15.5)
WBC: 6.2 10*3/uL (ref 4.0–10.5)

## 2014-04-16 LAB — BASIC METABOLIC PANEL
ANION GAP: 11 (ref 5–15)
BUN: 16 mg/dL (ref 6–23)
CALCIUM: 10.2 mg/dL (ref 8.4–10.5)
CHLORIDE: 102 meq/L (ref 96–112)
CO2: 25 meq/L (ref 19–32)
Creatinine, Ser: 0.83 mg/dL (ref 0.50–1.10)
GFR calc non Af Amer: 79 mL/min — ABNORMAL LOW (ref >90)
GLUCOSE: 105 mg/dL — AB (ref 70–99)
POTASSIUM: 3.6 meq/L — AB (ref 3.7–5.3)
SODIUM: 138 meq/L (ref 137–147)

## 2014-04-16 NOTE — ED Notes (Signed)
Pt in c/o chest pain, increased shortness of breath, also cough, states she was recently dx with pneumonia also, states she was using her inhaler at home without relief, no distress noted in triage

## 2014-04-17 ENCOUNTER — Emergency Department (HOSPITAL_COMMUNITY)
Admission: EM | Admit: 2014-04-17 | Discharge: 2014-04-17 | Disposition: A | Discharge status: Home / Self Care | Payer: BC Managed Care – PPO | Attending: Emergency Medicine | Admitting: Emergency Medicine

## 2014-04-17 DIAGNOSIS — R079 Chest pain, unspecified: Secondary | ICD-10-CM

## 2014-04-17 LAB — TROPONIN I

## 2014-04-17 MED ORDER — IBUPROFEN 800 MG PO TABS
800.0000 mg | ORAL_TABLET | Freq: Once | ORAL | Status: AC
  Administered 2014-04-17: 800 mg via ORAL

## 2014-04-17 NOTE — Discharge Instructions (Signed)
Chest Pain (Nonspecific) Lori Bush, you were seen today for chest pain. Your heart markers were negative on both lab studies. You are low risk for a blood clot in her lungs. Followup with your regular Dr. within 3 days for continued treatment of her chest pain. If her symptoms worsen come back to the emergency department for repeat evaluation. Thank you. It is often hard to give a diagnosis for the cause of chest pain. There is always a chance that your pain could be related to something serious, such as a heart attack or a blood clot in the lungs. You need to follow up with your doctor. HOME CARE  If antibiotic medicine was given, take it as directed by your doctor. Finish the medicine even if you start to feel better.  For the next few days, avoid activities that bring on chest pain. Continue physical activities as told by your doctor.  Do not use any tobacco products. This includes cigarettes, chewing tobacco, and e-cigarettes.  Avoid drinking alcohol.  Only take medicine as told by your doctor.  Follow your doctor's suggestions for more testing if your chest pain does not go away.  Keep all doctor visits you made. GET HELP IF:  Your chest pain does not go away, even after treatment.  You have a rash with blisters on your chest.  You have a fever. GET HELP RIGHT AWAY IF:   You have more pain or pain that spreads to your arm, neck, jaw, back, or belly (abdomen).  You have shortness of breath.  You cough more than usual or cough up blood.  You have very bad back or belly pain.  You feel sick to your stomach (nauseous) or throw up (vomit).  You have very bad weakness.  You pass out (faint).  You have chills. This is an emergency. Do not wait to see if the problems will go away. Call your local emergency services (911 in [place] Do not drive yourself to the hospital. MAKE SURE YOU:   Understand these instructions.  Will watch your condition.  Will get help right  away if you are not doing well or get worse. Document Released: 12/11/2007 Document Revised: 06/29/2013 Document Reviewed: 12/11/2007 [facility] Patient Information 2015 [place], [place]. This information is not intended to replace advice given to you by your health care provider. Make sure you discuss any questions you have with your health care provider.

## 2014-04-17 NOTE — ED Provider Notes (Signed)
CSN: [id number]     Arrival date & time 04/16/14  2050 History   First MD Initiated Contact with Patient 04/17/14 0129     Chief Complaint  Patient presents with  . Chest Pain     (Consider location/radiation/quality/duration/timing/severity/associated sxs/prior Treatment) HPI Taneisha Fuson is a 53 y.o. female with past medical history of hypertension coming in with chest pain. Patient states it's gone on for 5 days. It is left parasternal and sharp without radiation. She states is intermittent and lasts about 1 minute. Nothing makes it better or worse. She has shortness of breath associated with it. She denies emesis or diaphoresis. She has no history of stroke heart attack or blood clots. She was recently diagnosed with pneumonia and treated with Bactrim. Her productive cough has improved but she at times is yellow sputum. She denies any recent long distance travel, surgeries, or exogenous estrogen use. Patient has no further complaints.  10 Systems reviewed and are negative for acute change except as noted in the HPI.     Past Medical History  Diagnosis Date  . Arthritis Dx 2012  . Hypertension Dx 2005   Past Surgical History  Procedure Laterality Date  . Cesarean section    . Wisdom tooth extraction    . Hernia repair      as a child   Family History  Problem Relation Age of Onset  . Hypertension Mother   . Heart disease Mother   . Hypertension Father   . Dementia Father   . Arthritis Sister   . Hypertension Sister   . Arthritis Brother   . Hypertension Brother    History  Substance Use Topics  . Smoking status: Current Every Day Smoker -- 0.50 packs/day for 31 years    Types: Cigarettes  . Smokeless tobacco: Never Used  . Alcohol Use: No   OB History   Grav Para Term Preterm Abortions TAB SAB Ect Mult Living                 Review of Systems    Allergies  Review of patient's allergies indicates no known allergies.  Home Medications   Prior to  Admission medications   Medication Sig Start Date End Date Taking? Authorizing Provider  amLODipine (NORVASC) 10 MG tablet Take 1 tablet (10 mg total) by mouth daily. 04/05/14  Yes Josalyn C Funches, MD  hydrochlorothiazide (HYDRODIURIL) 25 MG tablet Take 1 tablet (25 mg total) by mouth daily. 04/05/14  Yes Josalyn C Funches, MD  lisinopril (PRINIVIL,ZESTRIL) 40 MG tablet Take 1 tablet (40 mg total) by mouth daily. 04/05/14  Yes Josalyn C Funches, MD  metoprolol succinate (TOPROL-XL) 50 MG 24 hr tablet Take 50 mg by mouth daily. Take with or immediately following a meal.   Yes Historical Provider, MD  Multiple Vitamins-Minerals (MULTIVITAL) CHEW Chew 1 tablet by mouth daily.   Yes Historical Provider, MD  albuterol (PROVENTIL HFA;VENTOLIN HFA) 108 (90 BASE) MCG/ACT inhaler Inhale 2 puffs into the lungs every 4 (four) hours as needed for wheezing or shortness of breath. 03/26/14   Pura Spice, PA-C   BP 126/88  Pulse 69  Temp(Src) 98.1 F (36.7 C) (Oral)  Resp 12  Ht 5' (1.524 m)  Wt 108 lb (48.988 kg)  BMI 21.09 kg/m2  SpO2 99% Physical Exam  Nursing note and vitals reviewed. Constitutional: She is oriented to person, place, and time. She appears well-developed and well-nourished. No distress.  HENT:  Head: Normocephalic and atraumatic.  Nose:  Nose normal.  Mouth/Throat: Oropharynx is clear and moist. No oropharyngeal exudate.  Eyes: Conjunctivae and EOM are normal. Pupils are equal, round, and reactive to light. No scleral icterus.  Neck: Normal range of motion. Neck supple. No JVD present. No tracheal deviation present. No thyromegaly present.  Cardiovascular: Normal rate, regular rhythm and normal heart sounds.  Exam reveals no gallop and no friction rub.   No murmur heard. Pulmonary/Chest: Effort normal and breath sounds normal. No respiratory distress. She has no wheezes. She exhibits no tenderness.  Abdominal: Soft. Bowel sounds are normal. She exhibits no distension and no  mass. There is no tenderness. There is no rebound and no guarding.  Musculoskeletal: Normal range of motion. She exhibits no edema and no tenderness.  Lymphadenopathy:    She has no cervical adenopathy.  Neurological: She is alert and oriented to person, place, and time. No cranial nerve deficit. She exhibits normal muscle tone.  Skin: Skin is warm and dry. No rash noted. No erythema. No pallor.    ED Course  Procedures (including critical care time) Labs Review Labs Reviewed  BASIC METABOLIC PANEL - Abnormal; Notable for the following:    Potassium 3.6 (*)    Glucose, Bld 105 (*)    GFR calc non Af Amer 79 (*)    All other components within normal limits  CBC  TROPONIN I  I-STAT TROPOININ, ED    Imaging Review Dg Chest 2 View  04/16/2014   CLINICAL DATA:  Chest pain. Shortness of breath. Cough and congestion.  EXAM: CHEST  2 VIEW  COMPARISON:  03/26/2014 appear  FINDINGS: The heart size and mediastinal contours are within normal limits. Both lungs are clear. The visualized skeletal structures are unremarkable.  IMPRESSION: No active cardiopulmonary disease.   Electronically Signed   By: Marcello Moores  Register   On: 04/16/2014 22:26     EKG Interpretation   Date/Time:  Saturday April 16 2014 20:55:25 EDT Ventricular Rate:  74 PR Interval:  104 QRS Duration: 84 QT Interval:  378 QTC Calculation: 419 R Axis:   73 Text Interpretation:  Sinus rhythm with short PR Otherwise normal ECG No  significant change since last tracing Confirmed by Glynn Octave  [id number]) on 04/17/2014 1:42:07 AM      MDM   Final diagnoses:  None    Patient presents emergency department out of concern for chest pain shortness of breath. I have low concern for ACS but we'll rule out with 2 troponins. The first one setback as was negative. Patient has not had any major PE risk factors. She has no recent travel, surgeries, a long period of immobilization, estrogen use. Patient currently appears  very well in no acute distress resting in bed comfortably.  She was advised to follow up with PCP within 3 days for provocative testing and repeat evaluation of chest pain.  Return precautions given.  Vitals remain within her normal limits and she is safe for DC.      Everlene Balls, MD 04/17/14 [phone]

## 2014-04-17 NOTE — ED Notes (Signed)
Pt c/o chest pain and SOB x 4 days.  Chest pain described as intermittent, sharp.  Pain originates in lower mid chest and shoots to left chest. Pt was dx with pneumonia 2 weeks ago and given prednisone and antibiotics.  She states she completed the regimen.  Pt reports cough, productive, with thick yellow sputum.  Pt reports dyspnea at rest and with activity.  It comes and goes along with sharp pains through the chest.  NAD, respirations equal and unlabored at this time.  Skin warm and dry.

## 2014-04-19 ENCOUNTER — Ambulatory Visit: Payer: BC Managed Care – PPO | Attending: Family Medicine

## 2014-04-19 NOTE — Progress Notes (Unsigned)
Patient ID: Lori Bush, female   DOB: Aug 29, 1960, 53 y.o.   MRN: [id number] Pt here for blood pressure recheck s/p HFU PNA Pt is compliant with taking bp meds Bush BP- 134/81 denies headache,dizziness or blurred vision

## 2014-04-19 NOTE — Patient Instructions (Signed)
Hypertension Hypertension, commonly called high blood pressure, is when the force of blood pumping through your arteries is too strong. Your arteries are the blood vessels that carry blood from your heart throughout your body. A blood pressure reading consists of a higher number over a lower number, such as 110/72. The higher number (systolic) is the pressure inside your arteries when your heart pumps. The lower number (diastolic) is the pressure inside your arteries when your heart relaxes. Ideally you want your blood pressure below 120/80. Hypertension forces your heart to work harder to pump blood. Your arteries may become narrow or stiff. Having hypertension puts you at risk for heart disease, stroke, and other problems.  RISK FACTORS Some risk factors for high blood pressure are controllable. Others are not.  Risk factors you cannot control include:   Race. You may be at higher risk if you are African American.  Age. Risk increases with age.  Gender. Men are at higher risk than women before age 45 years. After age 65, women are at higher risk than men. Risk factors you can control include:  Not getting enough exercise or physical activity.  Being overweight.  Getting too much fat, sugar, calories, or salt in your diet.  Drinking too much alcohol. SIGNS AND SYMPTOMS Hypertension does not usually cause signs or symptoms. Extremely high blood pressure (hypertensive crisis) may cause headache, anxiety, shortness of breath, and nosebleed. DIAGNOSIS  To check if you have hypertension, your health care provider will measure your blood pressure while you are seated, with your arm held at the level of your heart. It should be measured at least twice using the same arm. Certain conditions can cause a difference in blood pressure between your right and left arms. A blood pressure reading that is higher than normal on one occasion does not mean that you need treatment. If one blood pressure reading  is high, ask your health care provider about having it checked again. TREATMENT  Treating high blood pressure includes making lifestyle changes and possibly taking medicine. Living a healthy lifestyle can help lower high blood pressure. You may need to change some of your habits. Lifestyle changes may include:  Following the DASH diet. This diet is high in fruits, vegetables, and whole grains. It is low in salt, red meat, and added sugars.  Getting at least 2 hours of brisk physical activity every week.  Losing weight if necessary.  Not smoking.  Limiting alcoholic beverages.  Learning ways to reduce stress. If lifestyle changes are not enough to get your blood pressure under control, your health care provider may prescribe medicine. You may need to take more than one. Work closely with your health care provider to understand the risks and benefits. HOME CARE INSTRUCTIONS  Have your blood pressure rechecked as directed by your health care provider.   Take medicines only as directed by your health care provider. Follow the directions carefully. Blood pressure medicines must be taken as prescribed. The medicine does not work as well when you skip doses. Skipping doses also puts you at risk for problems.   Do not smoke.   Monitor your blood pressure at home as directed by your health care provider. SEEK MEDICAL CARE IF:   You think you are having a reaction to medicines taken.  You have recurrent headaches or feel dizzy.  You have swelling in your ankles.  You have trouble with your vision. SEEK IMMEDIATE MEDICAL CARE IF:  You develop a severe headache or confusion.    You have unusual weakness, numbness, or feel faint.  You have severe chest or abdominal pain.  You vomit repeatedly.  You have trouble breathing. MAKE SURE YOU:   Understand these instructions.  Will watch your condition.  Will get help right away if you are not doing well or get worse. Document  Released: 06/24/2005 Document Revised: 11/08/2013 Document Reviewed: 04/16/2013 ExitCare Patient Information 2015 [employer]. This information is not intended to replace advice given to you by your health care provider. Make sure you discuss any questions you have with your health care provider. DASH Eating Plan DASH stands for "Dietary Approaches to Stop Hypertension." The DASH eating plan is a healthy eating plan that has been shown to reduce high blood pressure (hypertension). Additional health benefits may include reducing the risk of type 2 diabetes mellitus, heart disease, and stroke. The DASH eating plan may also help with weight loss. WHAT DO I NEED TO KNOW ABOUT THE DASH EATING PLAN? For the DASH eating plan, you will follow these general guidelines:  Choose foods with a percent daily value for sodium of less than 5% (as listed on the food label).  Use salt-free seasonings or herbs instead of table salt or sea salt.  Check with your health care provider or pharmacist before using salt substitutes.  Eat lower-sodium products, often labeled as "lower sodium" or "no salt added."  Eat fresh foods.  Eat more vegetables, fruits, and low-fat dairy products.  Choose whole grains. Look for the word "whole" as the first word in the ingredient list.  Choose fish and skinless chicken or turkey more often than red meat. Limit fish, poultry, and meat to 6 oz (170 g) each day.  Limit sweets, desserts, sugars, and sugary drinks.  Choose heart-healthy fats.  Limit cheese to 1 oz (28 g) per day.  Eat more home-cooked food and less restaurant, buffet, and fast food.  Limit fried foods.  Cook foods using methods other than frying.  Limit canned vegetables. If you do use them, rinse them well to decrease the sodium.  When eating at a restaurant, ask that your food be prepared with less salt, or no salt if possible. WHAT FOODS CAN I EAT? Seek help from a dietitian for individual  calorie needs. Grains Whole grain or whole wheat bread. Brown rice. Whole grain or whole wheat pasta. Quinoa, bulgur, and whole grain cereals. Low-sodium cereals. Corn or whole wheat flour tortillas. Whole grain cornbread. Whole grain crackers. Low-sodium crackers. Vegetables Fresh or frozen vegetables (raw, steamed, roasted, or grilled). Low-sodium or reduced-sodium tomato and vegetable juices. Low-sodium or reduced-sodium tomato sauce and paste. Low-sodium or reduced-sodium canned vegetables.  Fruits All fresh, canned (in natural juice), or frozen fruits. Meat and Other Protein Products Ground beef (85% or leaner), grass-fed beef, or beef trimmed of fat. Skinless chicken or turkey. Ground chicken or turkey. Pork trimmed of fat. All fish and seafood. Eggs. Dried beans, peas, or lentils. Unsalted nuts and seeds. Unsalted canned beans. Dairy Low-fat dairy products, such as skim or 1% milk, 2% or reduced-fat cheeses, low-fat ricotta or cottage cheese, or plain low-fat yogurt. Low-sodium or reduced-sodium cheeses. Fats and Oils Tub margarines without trans fats. Light or reduced-fat mayonnaise and salad dressings (reduced sodium). Avocado. Safflower, olive, or canola oils. Natural peanut or almond butter. Other Unsalted popcorn and pretzels. The items listed above may not be a complete list of recommended foods or beverages. Contact your dietitian for more options. WHAT FOODS ARE NOT RECOMMENDED? Grains White bread.   White pasta. White rice. Refined cornbread. Bagels and croissants. Crackers that contain trans fat. Vegetables Creamed or fried vegetables. Vegetables in a cheese sauce. Regular canned vegetables. Regular canned tomato sauce and paste. Regular tomato and vegetable juices. Fruits Dried fruits. Canned fruit in light or heavy syrup. Fruit juice. Meat and Other Protein Products Fatty cuts of meat. Ribs, chicken wings, bacon, sausage, bologna, salami, chitterlings, fatback, hot dogs,  bratwurst, and packaged luncheon meats. Salted nuts and seeds. Canned beans with salt. Dairy Whole or 2% milk, cream, half-and-half, and cream cheese. Whole-fat or sweetened yogurt. Full-fat cheeses or blue cheese. Nondairy creamers and whipped toppings. Processed cheese, cheese spreads, or cheese curds. Condiments Onion and garlic salt, seasoned salt, table salt, and sea salt. Canned and packaged gravies. Worcestershire sauce. Tartar sauce. Barbecue sauce. Teriyaki sauce. Soy sauce, including reduced sodium. Steak sauce. Fish sauce. Oyster sauce. Cocktail sauce. Horseradish. Ketchup and mustard. Meat flavorings and tenderizers. Bouillon cubes. Hot sauce. Tabasco sauce. Marinades. Taco seasonings. Relishes. Fats and Oils Butter, stick margarine, lard, shortening, ghee, and bacon fat. Coconut, palm kernel, or palm oils. Regular salad dressings. Other Pickles and olives. Salted popcorn and pretzels. The items listed above may not be a complete list of foods and beverages to avoid. Contact your dietitian for more information. WHERE CAN I FIND MORE INFORMATION? National Heart, Lung, and Blood Institute: www.nhlbi.nih.gov/health/health-topics/topics/dash/ Document Released: 06/13/2011 Document Revised: 11/08/2013 Document Reviewed: 04/28/2013 ExitCare Patient Information 2015 [employer]. This information is not intended to replace advice given to you by your health care provider. Make sure you discuss any questions you have with your health care provider.  

## 2014-05-10 ENCOUNTER — Other Ambulatory Visit: Payer: Self-pay | Admitting: Family Medicine

## 2014-07-27 ENCOUNTER — Other Ambulatory Visit: Payer: Self-pay | Admitting: Internal Medicine

## 2014-08-18 ENCOUNTER — Encounter: Payer: Self-pay | Admitting: Family Medicine

## 2014-08-18 ENCOUNTER — Ambulatory Visit: Payer: Self-pay | Attending: Family Medicine | Admitting: Family Medicine

## 2014-08-18 VITALS — BP 123/82 | HR 83 | Temp 98.0°F | Resp 16 | Ht 60.0 in | Wt 106.8 lb

## 2014-08-18 DIAGNOSIS — J019 Acute sinusitis, unspecified: Secondary | ICD-10-CM | POA: Insufficient documentation

## 2014-08-18 DIAGNOSIS — M79644 Pain in right finger(s): Secondary | ICD-10-CM | POA: Insufficient documentation

## 2014-08-18 DIAGNOSIS — R634 Abnormal weight loss: Secondary | ICD-10-CM | POA: Insufficient documentation

## 2014-08-18 DIAGNOSIS — R21 Rash and other nonspecific skin eruption: Secondary | ICD-10-CM | POA: Insufficient documentation

## 2014-08-18 DIAGNOSIS — M25441 Effusion, right hand: Secondary | ICD-10-CM | POA: Insufficient documentation

## 2014-08-18 DIAGNOSIS — J0110 Acute frontal sinusitis, unspecified: Secondary | ICD-10-CM

## 2014-08-18 MED ORDER — NAPROXEN 500 MG PO TABS: 500.0000 mg | ORAL_TABLET | Freq: Two times a day (BID) | ORAL | Status: DC

## 2014-08-18 MED ORDER — FLUTICASONE PROPIONATE 50 MCG/ACT NA SUSP: 2.0000 | Freq: Every day | NASAL | Status: DC

## 2014-08-18 MED ORDER — MIRTAZAPINE 15 MG PO TABS: 15.0000 mg | ORAL_TABLET | Freq: Every day | ORAL | Status: DC

## 2014-08-18 MED ORDER — AMOXICILLIN 500 MG PO CAPS: 500.0000 mg | ORAL_CAPSULE | Freq: Two times a day (BID) | ORAL | Status: AC

## 2014-08-18 NOTE — Progress Notes (Signed)
Patient reports right hand pain specifically thumb and index finger x 3 days Patient reports sinus pain Patient reports facial discoloration Patient reports weight loss and has been eating a lot and drinking Ensure

## 2014-08-18 NOTE — Patient Instructions (Addendum)
Lori Bush,  Thank you for coming in today.  1. R hand pain: I suspect osteoarthritis Start naproxen 500 mg twice daily with food X-ray ordered   2. Sinusitis: Amoxicillin twice daily with food flonase at night.   3. Weight loss: We will call [facility] for colonoscopy records.  remeron 15 mg nightly to stimulate appetite  4. Face rash: dermatology referral placed.  F/u in 4-6  Months for R hand suspected osteoarthritis, sooner if needed   Dr. Adrian Blackwater

## 2014-08-18 NOTE — Progress Notes (Signed)
   Subjective:    Patient ID: Lori Bush, female    DOB: January 14, 1961, 54 y.o.   MRN: [id number] CC: R hand pain and  HPI 54 yo F:  1. R hand pain: one week ago. No injury. Mild swelling and morning stiffness in the whole hand. Thumbs stays stiff. Nothing makes it better. Warm epsom salt did not help. Tried tylenol, no improvement. Has not tried NSAID. Has had pain before about 2 years ago. Pain resolved over a few months. Believes it was spring time.   2. Facial pain: b/l forehead with pressure. No fever. Clear rhinorrhea. X 2 weeks. No improvement.   3. Weight loss: weight has been stable. Eating well. Unable to gain. No stomach upset. No diarrhea or constipation. No blood in stool.   Soc Hx: 6 cigs per day  Review of Systems As per HPI     Objective:   Physical Exam BP 123/82 mmHg  Pulse 83  Temp(Src) 98 F (36.7 C)  Resp 16  Ht 5' (1.524 m)  Wt 106 lb 12.8 oz (48.444 kg)  BMI 20.86 kg/m2  SpO2 99%  Wt Readings from Last 3 Encounters:  08/18/14 106 lb 12.8 oz (48.444 kg)  04/19/14 108 lb (48.988 kg)  04/16/14 108 lb (48.988 kg)  General appearance: alert, cooperative and no distress Head: Normocephalic, without obvious abnormality, atraumatic, sinuses nontender to percussion Eyes: conjunctivae/corneas clear. PERRL, EOM's intact. Ears: normal TM and external ear canal left ear and abnormal TM right ear - air-fluid level Nose: no discharge, turbinates pink, swollen Throat: lips, mucosa, and tongue normal; teeth and gums normal Neck: no adenopathy, supple, symmetrical, trachea midline and thyroid not enlarged, symmetric, no tenderness/mass/nodules Lungs: clear to auscultation bilaterally Heart: regular rate and rhythm, S1, S2 normal, no murmur, click, rub or gallop Extremities: extremities normal, atraumatic, no cyanosis or edema Skin: hyperpigmented macules on face w/o scaling or erythema        Assessment & Plan:

## 2014-08-20 ENCOUNTER — Encounter: Payer: Self-pay | Admitting: Family Medicine

## 2014-08-20 NOTE — Assessment & Plan Note (Addendum)
Hyperpigmented face rash: dermatology referral placed.

## 2014-08-20 NOTE — Assessment & Plan Note (Signed)
Sinusitis: Amoxicillin twice daily with food flonase at night.

## 2014-08-20 NOTE — Assessment & Plan Note (Signed)
1. R hand pain: I suspect osteoarthritis Start naproxen 500 mg twice daily with food X-ray ordered

## 2014-08-20 NOTE — Assessment & Plan Note (Addendum)
Weight loss: We will call [facility] for colonoscopy records.  remeron 15 mg nightly to stimulate appetite

## 2014-08-22 ENCOUNTER — Other Ambulatory Visit: Payer: Self-pay | Admitting: Family Medicine

## 2014-08-22 DIAGNOSIS — R634 Abnormal weight loss: Secondary | ICD-10-CM

## 2014-08-23 ENCOUNTER — Telehealth: Payer: Self-pay | Admitting: *Deleted

## 2014-08-23 NOTE — Telephone Encounter (Signed)
CT appointment on Aug 26, 2014 at 2:30 in [facility]/ NPO 4 hr prior to test  Pt ware of appointment     ---- Message from Minerva Ends, MD sent at 08/22/2014 5:29 PM EST -----    Ordered CT of abdomen and pelvis for unintentional weight loss.   Please inform patient and schedule scan.    Also please call [facility] for last colonoscopy and report

## 2014-08-26 ENCOUNTER — Ambulatory Visit (HOSPITAL_COMMUNITY): Payer: Self-pay

## 2014-08-30 ENCOUNTER — Ambulatory Visit (HOSPITAL_COMMUNITY): Admission: RE | Admit: 2014-08-30 | Payer: Self-pay | Source: Ambulatory Visit

## 2014-12-09 IMAGING — CR DG CHEST 2V
2 series · 2 of 2 positions shown · non-contrast
Comparison: 09/30/2010

CLINICAL DATA: Body aches.  Hypertension.

EXAM:
CHEST  2 VIEW

[w chest pa]
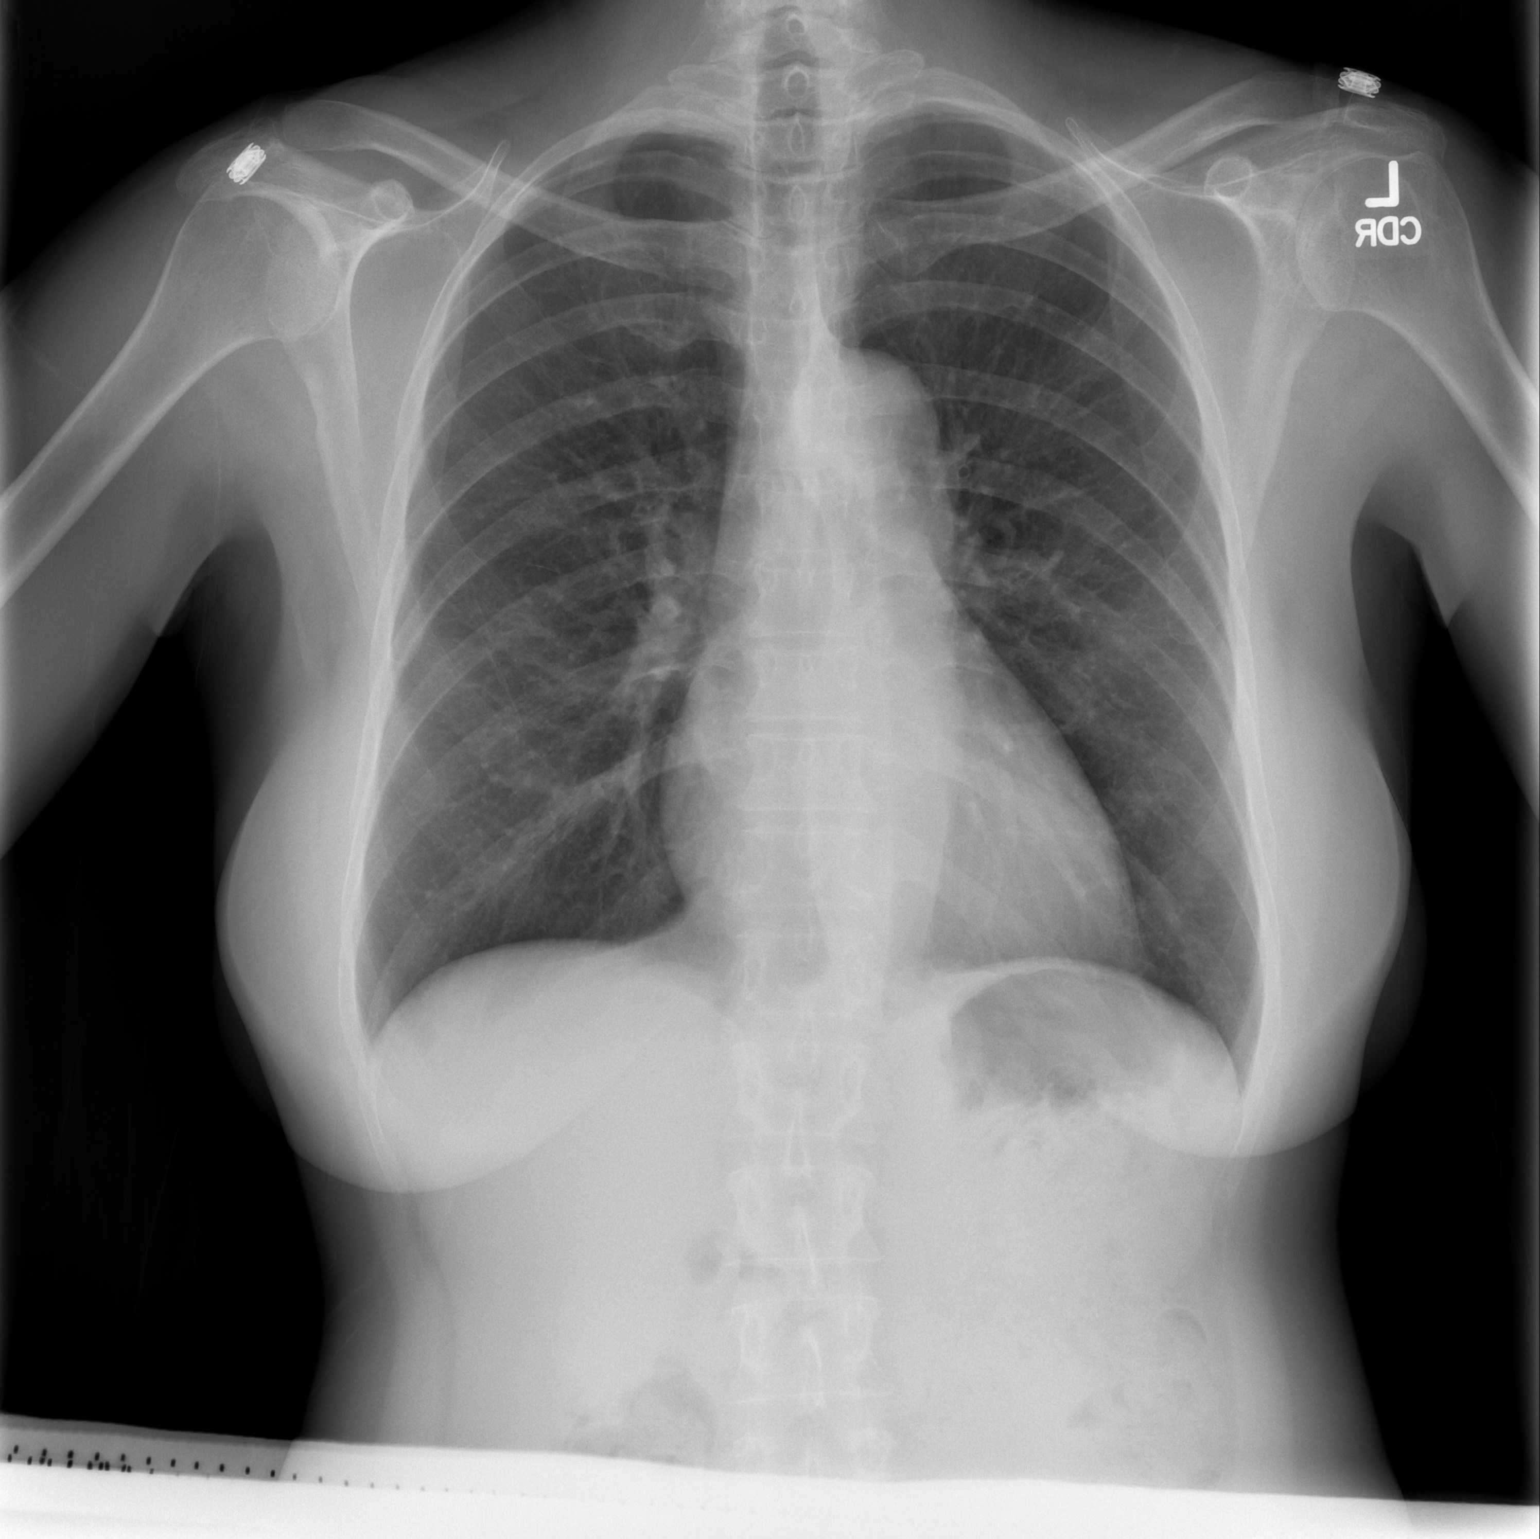

[w chest lat]
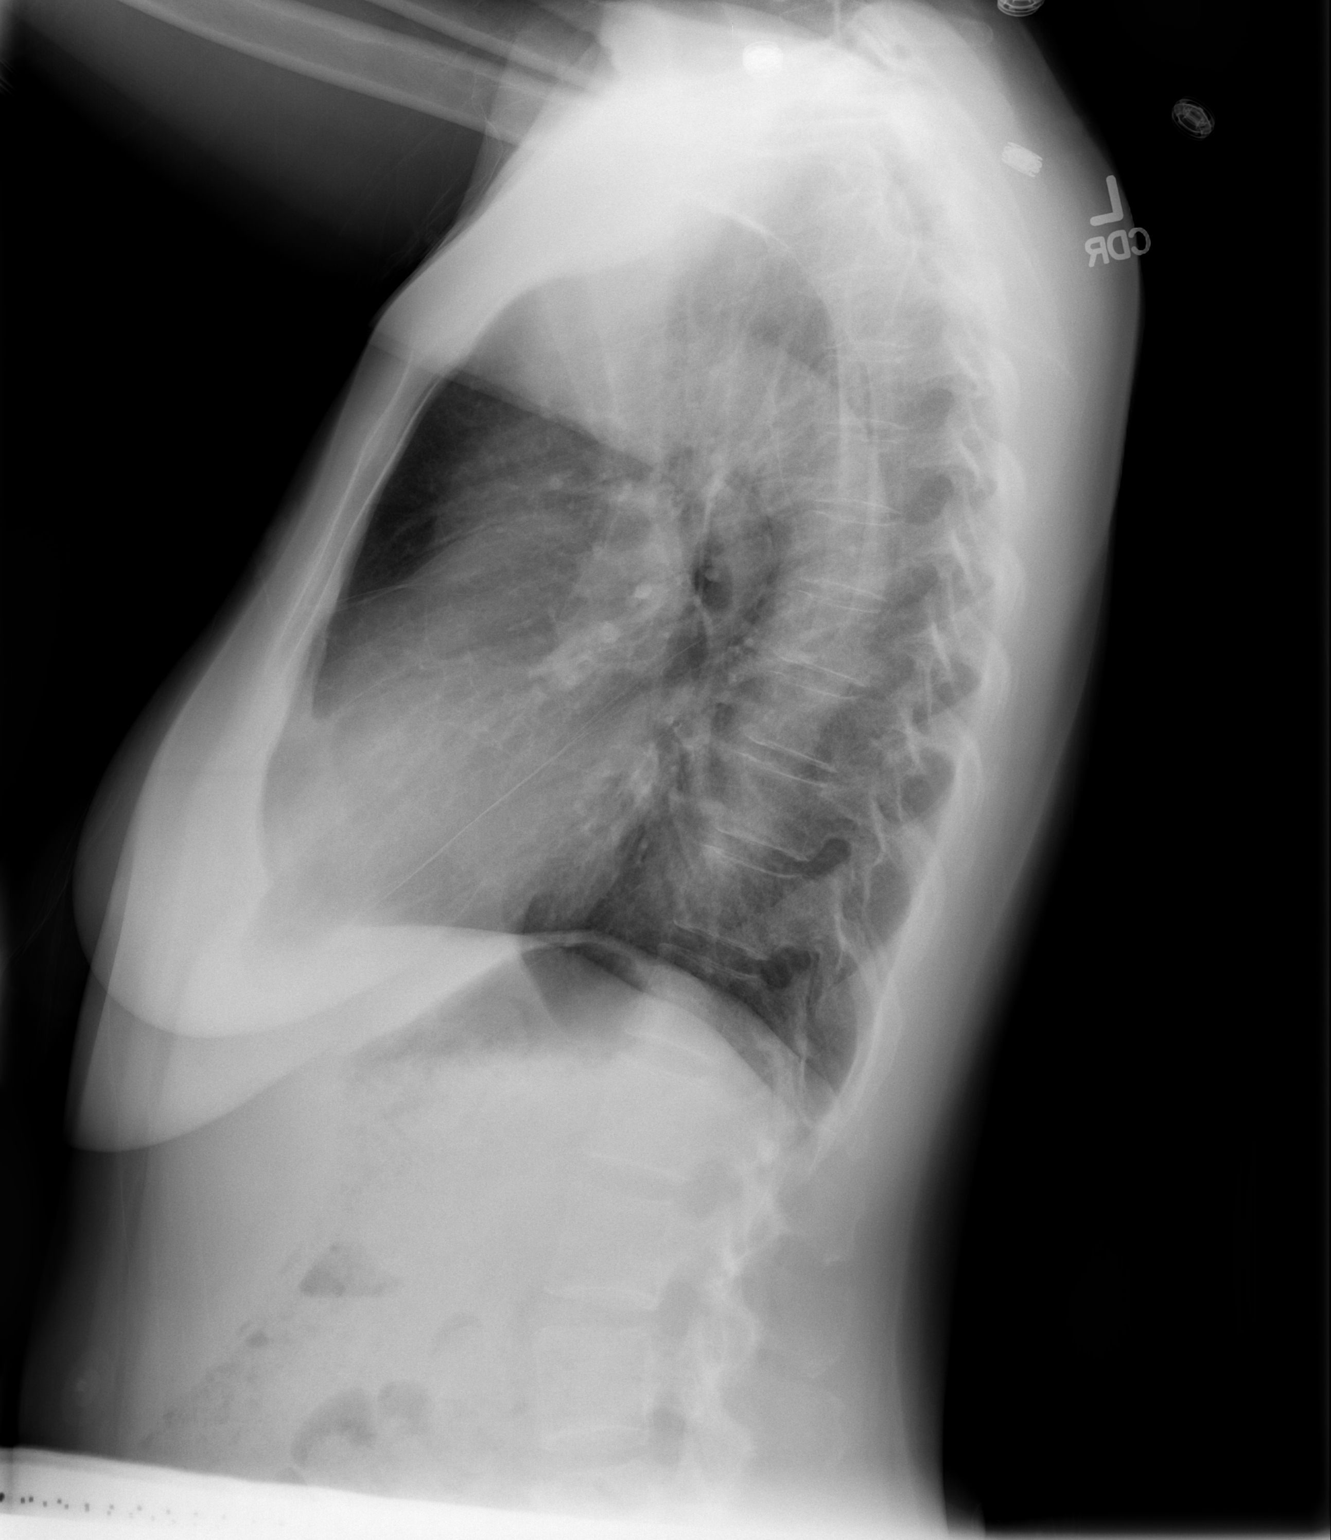

[2 of 2 positions shown; findings below may reference images not displayed]

FINDINGS: The heart size and mediastinal contours are within normal limits.
Both lungs are clear. The visualized skeletal structures are
unremarkable.
IMPRESSION: No active cardiopulmonary disease.

## 2014-12-30 ENCOUNTER — Other Ambulatory Visit: Payer: Self-pay | Admitting: Family Medicine

## 2015-01-03 ENCOUNTER — Encounter (HOSPITAL_COMMUNITY): Payer: Self-pay

## 2015-01-03 ENCOUNTER — Emergency Department (HOSPITAL_COMMUNITY)
Admission: EM | Admit: 2015-01-03 | Discharge: 2015-01-03 | Disposition: A | Discharge status: Home / Self Care | Payer: Self-pay | Attending: Emergency Medicine | Admitting: Emergency Medicine

## 2015-01-03 DIAGNOSIS — I10 Essential (primary) hypertension: Secondary | ICD-10-CM | POA: Insufficient documentation

## 2015-01-03 DIAGNOSIS — M70921 Unspecified soft tissue disorder related to use, overuse and pressure, right upper arm: Secondary | ICD-10-CM | POA: Insufficient documentation

## 2015-01-03 DIAGNOSIS — Z79899 Other long term (current) drug therapy: Secondary | ICD-10-CM | POA: Insufficient documentation

## 2015-01-03 DIAGNOSIS — Z720 Tobacco use: Secondary | ICD-10-CM | POA: Insufficient documentation

## 2015-01-03 DIAGNOSIS — M79641 Pain in right hand: Secondary | ICD-10-CM | POA: Insufficient documentation

## 2015-01-03 DIAGNOSIS — M7080 Other soft tissue disorders related to use, overuse and pressure of unspecified site: Secondary | ICD-10-CM

## 2015-01-03 DIAGNOSIS — T148XXA Other injury of unspecified body region, initial encounter: Secondary | ICD-10-CM

## 2015-01-03 DIAGNOSIS — Z7951 Long term (current) use of inhaled steroids: Secondary | ICD-10-CM | POA: Insufficient documentation

## 2015-01-03 DIAGNOSIS — M1990 Unspecified osteoarthritis, unspecified site: Secondary | ICD-10-CM | POA: Insufficient documentation

## 2015-01-03 DIAGNOSIS — X503XXA Overexertion from repetitive movements, initial encounter: Secondary | ICD-10-CM

## 2015-01-03 DIAGNOSIS — M79601 Pain in right arm: Secondary | ICD-10-CM

## 2015-01-03 MED ORDER — NAPROXEN 250 MG PO TABS
500.0000 mg | ORAL_TABLET | Freq: Once | ORAL | Status: AC
  Administered 2015-01-03: 500 mg via ORAL
  Filled 2015-01-03: qty 2

## 2015-01-03 MED ORDER — NAPROXEN 500 MG PO TABS: 500.0000 mg | ORAL_TABLET | Freq: Two times a day (BID) | ORAL | Status: DC | PRN

## 2015-01-03 NOTE — Discharge Instructions (Signed)
Wear shoulder sling for no more than 3 days, then begin performing gentle range of motion exercises. Use heat to the areas of pain throughout the day, no more than 20 minutes at a time every hour. Alternate between naprosyn and tylenol for pain. Follow up with  Your regular doctor in 1 week for recheck of symptoms. Return to the ER for changes or worsening symptoms.    Repetitive Strain Injuries Repetitive strain injuries (RSIs) result from overuse or misuse of soft tissues including muscles, tendons, or nerves. Tendons are the cord-like structures that attach muscles to bones. RSIs can affect almost any part of the body. However, RSIs are most common in the arms (thumbs, wrists, elbows, shoulders) and legs (ankles, knees). Common medical conditions that are often caused by repetitive strain include carpal tunnel syndrome, tennis or golfer's elbow, bursitis, and tendonitis. If RSIs are treated early, and therepeated activity is reduced or removed, the severity and length of your problems can usually be reduced. RSIs are also called cumulative trauma disorders (CTD).  CAUSES  Many RSIs occur due to repeating the same activity at work over weeks or months without sufficient rest, such as prolonged typing. RSIs also commonly occur when a hobby or sport is done repeatedly without sufficient rest. RSIs can also occur due to repeated strain or stress on a body part in someone who has one or more risk factors for RSIs. RISK FACTORS Workplace risk factors  Frequent computer use, especially if your workstation is not adjusted for your body type.  Infrequent rest breaks.  Working in a high-pressure environment.  Working at a [employer].  Repeating the same motion, such as frequent typing.  Working in an awkward position or holding the same position for a long time.  Forceful movements such as lifting, pulling, or pushing.  Vibration caused by using power tools.  Working in cold temperatures.  Job  stress. Personal risk factors  Poor posture.  Being loose-jointed.  Not exercising regularly.  Being overweight.  Arthritis, diabetes, thyroid problems, or other long-term (chronic)medical conditions.  Vitamin deficiencies.  Keeping your fingernails long.  An unhealthy, stressful, or inactive lifestyle.  Not sleeping well. SYMPTOMS  Symptoms often begin at work but become more noticeable after the repeated stress has ended. For example, you may develop fatigue or soreness in your wrist while typingat work, and at night you may develop numbness and tingling in your fingers. Common symptoms include:   Burning, shooting, or aching pain, especially in the fingers, palms, wrists, forearms, or shoulders.  Tenderness.  Swelling.  Tingling, numbness, or loss of feeling.  Pain with certain activities, such as turning a doorknob or reaching above your head.  Weakness, heaviness, or loss of coordination in yourhand.  Muscle spasms or tightness. In some cases, symptoms can become so intense that it is difficult to perform everyday tasks. Symptoms that do not improve with rest may indicate a more serious condition.  DIAGNOSIS  Your caregiver may determine the type ofRSI you have based on your medical evaluation and a description of your activities.  TREATMENT  Treatment depends on the severity and type of RSI you have. Your caregiver may recommend rest for the affected body part, medicines, and physical or occupational therapy to reduce pain, swelling, and soreness. Discuss the activities you do repeatedly with your caregiver. Your caregiver can help you decide whether you need to change your activities. An RSI may take months or years to heal, especially if the affected body part gets  insufficient rest. In some cases, such as severe carpal tunnel syndrome, surgery may be recommended. PREVENTION  Talk with your supervisor to make sure you have the proper equipment for your work  station.  Maintain good posture at your desk or work station with:  Feet flat on the floor.  Knees directly over the feet, bent at a right angle.  Lower back supported by your chair or a cushion in the curve of your lower back.  Shoulders and arms relaxed and at your sides.  Neck relaxed and not bent forwards or backwards.  Your desk and computer workstation properly adjusted to your body type.  Your chair adjusted so there is no excess pressure on the back of your thighs.  The keyboard resting above your thighs. You should be able to reach the keys with your elbows at your side, bent at a right angle. Your arms should be supported on forearm rests, with your forearms parallel to the ground.  The computer mouse within easy reach.  The monitor directly in front of you, so that your eyes are aligned with the top of the screen. The screen should be about 15 to 25 inches from your eyes.  While typing, keep your wrist straight, in a neutral position. Move your entire arm when you move your mouse or when typing hard-to-reach keys.  Only use your computer as much as you need to for work. Do not use it during breaks.  Take breaks often from any repeated activity. Alternate with another task which requires you to use different muscles, or rest at least once every hour.  Change positions regularly. If you spend a lot of time sitting, get up, walk around, and stretch.  Do not hold pens or pencils tightly when writing.  Exercise regularly.  Maintain a normal weight.  Eat a diet with plenty of vegetables, whole grains, and fruit.  Get sufficient, restful sleep. HOME CARE INSTRUCTIONS  If your caregiver prescribed medicine to help reduce swelling, take it as directed.  Only take over-the-counter or prescription medicines for pain, discomfort, or fever as directed by your caregiver.  Reduce, and if needed, stopthe activities that are causing your problems until you have no further  symptoms.If your symptoms are work-related, you may need to talk to your supervisor about changing your activities.  When symptoms develop, put ice or a cold pack on the aching area.  Put ice in a plastic bag.  Place a towel between your skin and the bag.  Leave the ice on for 15-20 minutes.  If you were given a splint to keep your wrist from bending, wear it as instructed. It is important to wear the splint at night. Use the splint for as long as your caregiver recommends. SEEK MEDICAL CARE IF:  You develop new problems.  Your problems do not get better with medicine. MAKE SURE YOU:  Understand these instructions.  Will watch your condition.  Will get help right away if you are not doing well or get worse. Document Released: 06/14/2002 Document Revised: 12/24/2011 Document Reviewed: 08/15/2011 [facility] Patient Information 2015 [place], [place]. This information is not intended to replace advice given to you by your health care provider. Make sure you discuss any questions you have with your health care provider.  Musculoskeletal Pain Musculoskeletal pain is muscle and boney aches and pains. These pains can occur in any part of the body. Your caregiver may treat you without knowing the cause of the pain. They may treat you if  blood or urine tests, X-rays, and other tests were normal.  CAUSES There is often not a definite cause or reason for these pains. These pains may be caused by a type of germ (virus). The discomfort may also come from overuse. Overuse includes working out too hard when your body is not fit. Boney aches also come from weather changes. Bone is sensitive to atmospheric pressure changes. HOME CARE INSTRUCTIONS   Ask when your test results will be ready. Make sure you get your test results.  Only take over-the-counter or prescription medicines for pain, discomfort, or fever as directed by your caregiver. If you were given medications for your condition, do not drive,  operate machinery or power tools, or sign legal documents for 24 hours. Do not drink alcohol. Do not take sleeping pills or other medications that may interfere with treatment.  Continue all activities unless the activities cause more pain. When the pain lessens, slowly resume normal activities. Gradually increase the intensity and duration of the activities or exercise.  During periods of severe pain, bed rest may be helpful. Lay or sit in any position that is comfortable.  Putting ice on the injured area.  Put ice in a bag.  Place a towel between your skin and the bag.  Leave the ice on for 15 to 20 minutes, 3 to 4 times a day.  Follow up with your caregiver for continued problems and no reason can be found for the pain. If the pain becomes worse or does not go away, it may be necessary to repeat tests or do additional testing. Your caregiver may need to look further for a possible cause. SEEK IMMEDIATE MEDICAL CARE IF:  You have pain that is getting worse and is not relieved by medications.  You develop chest pain that is associated with shortness or breath, sweating, feeling sick to your stomach (nauseous), or throw up (vomit).  Your pain becomes localized to the abdomen.  You develop any new symptoms that seem different or that concern you. MAKE SURE YOU:   Understand these instructions.  Will watch your condition.  Will get help right away if you are not doing well or get worse. Document Released: 06/24/2005 Document Revised: 09/16/2011 Document Reviewed: 02/26/2013 [facility] Patient Information 2015 [place], [place]. This information is not intended to replace advice given to you by your health care provider. Make sure you discuss any questions you have with your health care provider.

## 2015-01-03 NOTE — ED Provider Notes (Signed)
CSN: [id number]     Arrival date & time 01/03/15  2224 History  This chart was scribed for non-physician practitioner, Zacarias Pontes, PA-C working with Milton Ferguson, MD, by Chester Holstein, ED Scribe. This patient was seen in room TR05C/TR05C and the patient's care was started at 11:02 PM.      Chief Complaint  Patient presents with  . Hand Problem     Patient is a 54 y.o. female presenting with hand pain. The history is provided by the patient. No language interpreter was used.  Hand Pain This is a new problem. The current episode started more than 1 week ago. The problem occurs constantly. The problem has not changed since onset.Pertinent negatives include no chest pain, no abdominal pain and no shortness of breath. The symptoms are aggravated by bending. The symptoms are relieved by NSAIDs. Treatments tried: NSAIDs. The treatment provided mild relief.   HPI Comments: Lori Bush is a 54 y.o. female with PMHx of arthritis and HTN,  who presents to the Emergency Department complaining of constant 7/10 throbbing right thumb pain radiating into shoulder with onset 2 months ago, aggravated by working with hands and repetitive motion at work including overhead activities, and alleviated with ibuprofen.  She has also tried Epsom salt baths with no relief. Pt notes decreased ROM of hand secondary to pain. She reports increased stiffness first thing in the morning for which she runs her hand under warm water for relief.  Pt was seen by PCP for same in past and given Rx for Naprosyn which she reports she has not been compliant with. Pt reports she took her BP medication today. Pt denies fall, fever, chills, redness, warmth, nausea, vomiting, diarrhea, abdominal pain, constipation, chest pain, SOB, difficulty urinating, dysuria, hematuria, generalized body aches, numbness, tingling, and weakness.    Past Medical History  Diagnosis Date  . Arthritis Dx 2012  . Hypertension Dx 2005    Past Surgical History  Procedure Laterality Date  . Cesarean section    . Wisdom tooth extraction    . Hernia repair      as a child   Family History  Problem Relation Age of Onset  . Hypertension Mother   . Heart disease Mother   . Hypertension Father   . Dementia Father   . Arthritis Sister   . Hypertension Sister   . Arthritis Brother   . Hypertension Brother    History  Substance Use Topics  . Smoking status: Current Every Day Smoker -- 0.25 packs/day for 31 years    Types: Cigarettes  . Smokeless tobacco: Never Used     Comment: states she cut back 3 cigarettes/day  . Alcohol Use: No   OB History    No data available     Review of Systems  Constitutional: Negative for fever and chills.  Respiratory: Negative for shortness of breath.   Cardiovascular: Negative for chest pain.  Gastrointestinal: Negative for nausea, vomiting, abdominal pain, diarrhea, constipation and blood in stool.  Genitourinary: Negative for dysuria, hematuria and difficulty urinating.  Musculoskeletal: Positive for myalgias and arthralgias.  Skin: Negative for color change.  Neurological: Negative for weakness and numbness.   A complete 10 system review of systems was obtained and all systems are negative except as noted in the HPI and PMH.     Allergies  Review of patient's allergies indicates no known allergies.  Home Medications   Prior to Admission medications   Medication Sig Start Date End Date Taking? Authorizing  Provider  albuterol (PROVENTIL HFA;VENTOLIN HFA) 108 (90 BASE) MCG/ACT inhaler Inhale 2 puffs into the lungs every 4 (four) hours as needed for wheezing or shortness of breath. Patient not taking: Reported on 08/18/2014 03/26/14   Al Corpus, PA-C  amLODipine (NORVASC) 10 MG tablet TAKE 1 TABLET BY MOUTH DAILY 07/28/14   Boykin Nearing, MD  fluticasone (FLONASE) 50 MCG/ACT nasal spray Place 2 sprays into both nostrils at bedtime. 08/18/14   Josalyn Funches, MD   hydrochlorothiazide (HYDRODIURIL) 25 MG tablet Take 1 tablet (25 mg total) by mouth daily. 04/05/14   Josalyn Funches, MD  lisinopril (PRINIVIL,ZESTRIL) 40 MG tablet Take 1 tablet (40 mg total) by mouth daily. 04/05/14   Josalyn Funches, MD  mirtazapine (REMERON) 15 MG tablet Take 1 tablet (15 mg total) by mouth at bedtime. 08/18/14   Boykin Nearing, MD  Multiple Vitamins-Minerals (MULTIVITAL) CHEW Chew 1 tablet by mouth daily.    Historical Provider, MD  naproxen (NAPROSYN) 500 MG tablet Take 1 tablet (500 mg total) by mouth 2 (two) times daily with a meal. For one week for R hand pain 08/18/14   Josalyn Funches, MD   BP 188/99 mmHg  Pulse 76  Temp(Src) 97.8 F (36.6 C)  Resp 18  SpO2 100% Physical Exam  Constitutional: She is oriented to person, place, and time. Vital signs are normal. She appears well-developed and well-nourished.  Non-toxic appearance. No distress.  Afebrile, nontoxic, NAD  HENT:  Head: Normocephalic and atraumatic.  Mouth/Throat: Mucous membranes are normal.  Eyes: Conjunctivae and EOM are normal. Right eye exhibits no discharge. Left eye exhibits no discharge.  Neck: Normal range of motion. Neck supple.  Cardiovascular: Normal rate and intact distal pulses.   Pulmonary/Chest: Effort normal. No respiratory distress.  Abdominal: Normal appearance. She exhibits no distension.  Musculoskeletal: Normal range of motion.       Right shoulder: She exhibits tenderness. She exhibits normal range of motion, no bony tenderness, no swelling, no crepitus, no deformity, no spasm, normal pulse and normal strength.  R shoulder with FROM intact, no bony TTP, with diffuse muscular TTP with spasms, extending all the way down her arm and hand, no swelling/effusion, no crepitus/deformity, negative apley scratch, neg pain with resisted int/ext rotation, neg empty can test. Strength and sensation grossly intact in all extremities, distal pulses intact.    Neurological: She is alert and  oriented to person, place, and time. She has normal strength. No sensory deficit.  Skin: Skin is warm, dry and intact. No rash noted. No erythema.  Psychiatric: She has a normal mood and affect. Her behavior is normal.  Nursing note and vitals reviewed.   ED Course  Procedures (including critical care time) DIAGNOSTIC STUDIES: Oxygen Saturation is 100% on room air, normal by my interpretation.    COORDINATION OF CARE: 11:07 PM Discussed treatment plan with patient at beside, the patient agrees with the plan and has no further questions at this time.   Labs Review Labs Reviewed - No data to display  Imaging Review No results found.   EKG Interpretation None      MDM   Final diagnoses:  Right arm pain  Right hand pain  Repetitive motion injury  HTN (hypertension), benign    55 y.o. female here with R arm pain from the shoulder down to the hand, no focal bony tenderness, doubt need for imaging. 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 Neurovascularly intact with soft compartments. Likely just repetitive motion injury  vs OA. Has been seen before and was told to do NSAIDs which she hasn't done. Will start NSAIDs and give shoulder immobilizer to rest her arm for 2 days but then discussed ROM exercises thereafter. Will have her f/up with PCP in 1wk for recheck.  I explained the diagnosis and have given explicit precautions to return to the ER including for any other new or worsening symptoms. The patient understands and accepts the medical plan as it's been dictated and I have answered their questions. Discharge instructions concerning home care and prescriptions have been given. The patient is STABLE and is discharged to home in good condition.   I personally performed the services described in this documentation, which was scribed in my presence. The recorded information has been reviewed and is accurate.  BP 188/99 mmHg  Pulse 76  Temp(Src) 97.8  F (36.6 C)  Resp 18  SpO2 100%  Meds ordered this encounter  Medications  . naproxen (NAPROSYN) tablet 500 mg    Sig:   . naproxen (NAPROSYN) 500 MG tablet    Sig: Take 1 tablet (500 mg total) by mouth 2 (two) times daily as needed for mild pain, moderate pain or headache (TAKE WITH MEALS.).    Dispense:  20 tablet    Refill:  0    Order Specific Question:  Supervising Provider    Answer:  Noemi Chapel [[id number]]       Dainelle Hun Camprubi-Soms, PA-C 01/03/15 1540  Milton Ferguson, MD 01/06/15 1356

## 2015-01-03 NOTE — ED Notes (Signed)
Pt reports onset 2 months ago right thumb started getting numb, then progressed to unable to bend thumb, now right grip is weak and pain radiates up right arm into right shoulder.  No known injury.

## 2015-01-03 NOTE — ED Notes (Signed)
Pt has taken BP med today.

## 2015-01-17 ENCOUNTER — Encounter: Payer: Self-pay | Admitting: Family Medicine

## 2015-01-17 ENCOUNTER — Ambulatory Visit: Payer: Self-pay | Attending: Family Medicine | Admitting: Family Medicine

## 2015-01-17 VITALS — BP 138/88 | HR 66 | Temp 98.8°F | Resp 16 | Ht 61.0 in | Wt 116.0 lb

## 2015-01-17 DIAGNOSIS — F17210 Nicotine dependence, cigarettes, uncomplicated: Secondary | ICD-10-CM

## 2015-01-17 DIAGNOSIS — M25511 Pain in right shoulder: Secondary | ICD-10-CM | POA: Insufficient documentation

## 2015-01-17 DIAGNOSIS — I10 Essential (primary) hypertension: Secondary | ICD-10-CM | POA: Insufficient documentation

## 2015-01-17 DIAGNOSIS — M79644 Pain in right finger(s): Secondary | ICD-10-CM

## 2015-01-17 DIAGNOSIS — G8929 Other chronic pain: Secondary | ICD-10-CM

## 2015-01-17 DIAGNOSIS — R634 Abnormal weight loss: Secondary | ICD-10-CM

## 2015-01-17 DIAGNOSIS — M79641 Pain in right hand: Secondary | ICD-10-CM | POA: Insufficient documentation

## 2015-01-17 DIAGNOSIS — F17200 Nicotine dependence, unspecified, uncomplicated: Secondary | ICD-10-CM | POA: Insufficient documentation

## 2015-01-17 MED ORDER — NAPROXEN 500 MG PO TABS
500.0000 mg | ORAL_TABLET | Freq: Two times a day (BID) | ORAL | Status: DC | PRN
Start: 2015-01-17 — End: 2016-10-28

## 2015-01-17 MED ORDER — VARENICLINE TARTRATE 0.5 MG X 11 & 1 MG X 42 PO MISC: ORAL | Status: DC

## 2015-01-17 MED ORDER — VARENICLINE TARTRATE 1 MG PO TABS: 1.0000 mg | ORAL_TABLET | Freq: Two times a day (BID) | ORAL | Status: DC

## 2015-01-17 NOTE — Assessment & Plan Note (Addendum)
HTN: Goal BP < 140/90 Continue norvasc 10 mg daily with lisinopril 40 mg daily Low salt diet

## 2015-01-17 NOTE — Progress Notes (Signed)
complaining of arm and shoulder pain Unable to move rt  1st finger

## 2015-01-17 NOTE — Progress Notes (Addendum)
   Subjective:    Patient ID: Lori Bush, female    DOB: 1960-11-25, 54 y.o.   MRN: [id number] CC: arm and shoulder pain, unable to move R 1st finger, HTN  HPI 54 yo F presents for f/u visit:  1. R shoulder pain: severe last week. Patient went to ED. Given naproxen. No injury. Patient works as a Art therapist. Does a lot of lifting and repetitive motions with her arms and hands. Also has R thumb pain with stiffness in PIP joint and stiffness in PIP joints of fingers in R hand. She is R handed.  2. HTN: taking norvasc and lisinopril. Did not take medicine this AM. Smoking. Has stress at work as Freight forwarder and stress at home with her autistic son.   3. Smoking: desires to quit. Would like to quit cold [place]. No cough or SOB. Reluctant to try chantix due to side effects of SI and nightmares. She denies depression, SI and nightmares.   Soc Hx: current smoker  Review of Systems  Constitutional: Negative for fever and chills.  Respiratory: Negative for shortness of breath.   Cardiovascular: Negative for chest pain.  Gastrointestinal: Negative for abdominal pain and blood in stool.  Musculoskeletal: Positive for joint swelling and arthralgias.  Skin: Negative for rash.  Psychiatric/Behavioral: Negative for suicidal ideas, sleep disturbance and dysphoric mood.      Objective:   Physical Exam BP 138/88 mmHg  Pulse 66  Temp(Src) 98.8 F (37.1 C) (Oral)  Resp 16  Ht 5\' 1"  (1.549 m)  Wt 116 lb (52.617 kg)  BMI 21.93 kg/m2  SpO2 98%  Wt Readings from Last 3 Encounters:  01/17/15 116 lb (52.617 kg)  08/18/14 106 lb 12.8 oz (48.444 kg)  04/19/14 108 lb (48.988 kg)    General appearance: alert, cooperative and no distress Shoulder: Inspection reveals no abnormalities, atrophy or asymmetry. Palpation is normal with no tenderness over AC joint or bicipital groove. ROM is limited in frontal plane due to pain Hands: mild swelling at PIP joints w/o erythema      Assessment &  Plan:

## 2015-01-17 NOTE — Patient Instructions (Signed)
Lori Bush.  Thank you for coming in today  1. R shoulder and hand arthritis: Continue naproxen Tart cherry juice is also a natural antiinflammatory that you can take  Sports medicine referral placed, please re-apply for orange card and [place] discount   2. HTN: Goal BP < 140/90 Continue norvasc 10 mg daily with lisinopril 40 mg daily Low salt diet  3. Smoking: Smoking cessation support: smoking cessation hotline: 1-800-QUIT-NOW.  Smoking cessation classes are available through [facility] and [facility]. Call [phone] or visit our website at [email or web address]  Start chantix: Take one 0.5 mg tablet by mouth once daily for 3 days, then increase to one 0.5 mg tablet twice daily for 4 days, then increase to one 1 mg tablet twice daily. Set quit date for 8-35 days after starting chantix.  F/u in 4 weeks for smoking cessation  Dr. Adrian Blackwater

## 2015-01-17 NOTE — Assessment & Plan Note (Signed)
R shoulder and hand arthritis: Continue naproxen Tart cherry juice is also a natural antiinflammatory that you can take  Sports medicine referral placed, please re-apply for orange card and [place] discount

## 2015-01-17 NOTE — Assessment & Plan Note (Signed)
Resolved

## 2015-01-17 NOTE — Assessment & Plan Note (Signed)
Smoking: Smoking cessation support: smoking cessation hotline: 1-800-QUIT-NOW.  Smoking cessation classes are available through [facility] and [facility]. Call [phone] or visit our website at [email or web address]  Start chantix: Take one 0.5 mg tablet by mouth once daily for 3 days, then increase to one 0.5 mg tablet twice daily for 4 days, then increase to one 1 mg tablet twice daily. Set quit date for 8-35 days after starting chantix.

## 2015-01-25 ENCOUNTER — Ambulatory Visit (INDEPENDENT_AMBULATORY_CARE_PROVIDER_SITE_OTHER): Payer: Self-pay | Admitting: Sports Medicine

## 2015-01-25 ENCOUNTER — Encounter: Payer: Self-pay | Admitting: Sports Medicine

## 2015-01-25 VITALS — BP 160/96 | Ht 60.0 in | Wt 115.0 lb

## 2015-01-25 DIAGNOSIS — M65311 Trigger thumb, right thumb: Secondary | ICD-10-CM

## 2015-01-25 DIAGNOSIS — M25511 Pain in right shoulder: Secondary | ICD-10-CM

## 2015-01-25 DIAGNOSIS — M79644 Pain in right finger(s): Secondary | ICD-10-CM

## 2015-01-25 MED ORDER — METHYLPREDNISOLONE ACETATE 40 MG/ML IJ SUSP
40.0000 mg | Freq: Once | INTRAMUSCULAR | Status: AC
  Administered 2015-01-25: 40 mg via INTRA_ARTICULAR

## 2015-01-25 NOTE — Progress Notes (Signed)
   Subjective:    Patient ID: Lori Bush, female    DOB: [birth year], 54 y.o.   MRN: [id number]  HPI chief complaint: Right shoulder and right thumb pain  Pleasant 54 year old right-hand-dominant female comes in today with a couple of different complaints. First complaint is pain in the right shoulder. Pain began without any known trauma. She describes a diffuse discomfort around the shoulder which will radiate at times down into her arm. She will also experience intermittent numbness and tingling. She states that her symptoms are worse with any type of shoulder motion. She also gets intermittent neck pain. No prior shoulder surgery. She is also complaining of pain in her right thumb. She has noticed an inability to completely flex the thumb for the past 8 months. She gets pain primarily around the MCP joint. No swelling. She has noticed some weakness.  Past medical history reviewed Medications reviewed Allergies reviewed    Review of Systems    as above Objective:   Physical Exam  Well-developed, well-nourished. No acute distress. Awake alert and oriented 3. Vital signs reviewed  Right shoulder: No gross deformity. No soft tissue swelling. No tenderness to palpation. Patient has limited range of motion actively in all planes particularly with internal rotation. She does have good passive external rotation however. Positive empty can, positive Hawkins. Rotator cuff strength is 5/5 but is reproducible pain with resisted supraspinatus. No atrophy.  Right hand: There is a large palpable painful nodule in the palm are aspect of the thumb at the level of the MCP joint consistent with a large flexor tendon nodule. Patient is able to achieve full extension with her right thumb but only about 50% of flexion. No soft tissue swelling. She does have weakness in the thumb I do not appreciate any significant thenar eminence wasting. Good radial ulnar pulses.  X-rays of her right shoulder in  2015 were unremarkable. No significant degenerative changes. Nothing acute. X- rays of her cervical spine done in December 2013 demonstrated mild foraminal stenosis on the right at C4-C5 C5-C6 and C6-C7.      Assessment & Plan:  Right thumb pain secondary to large flexor tendon nodule (trigger thumb) Diffuse right shoulder pain secondary to rotator cuff tendinopathy versus cervical radiculopathy  For diagnostic as well as therapeutic reasons I've decided to proceed with a subacromial cortisone injection today. Patient tolerated this without difficulty. Patient will follow-up with me in one month. If symptoms persist I would consider an EMG/nerve conduction study to rule out cervical radiculopathy as a cause of referred pain to her right shoulder. For her large flexor tendon nodule in her right thumb I would like to refer her to order the orthopedist to consider A1 pulley release. She's had symptoms for 8 months and is severely limited in regards to function. This is her dominant hand as well. As of the time of this dictation she was awaiting approval for the "orange card". Therefore, we will schedule her appointment with the orthopedist for sometime after August 9th.  Consent obtained and verified. Time-out conducted. Noted no overlying erythema, induration, or other signs of local infection. Skin prepped in a sterile fashion. Topical analgesic spray: Ethyl chloride. Joint: right shoulder (subacromial) Needle: 25g 1.5 inch Completed without difficulty. Meds: 3cc 1%xylocaine, 1cc (40mg ) depomedrol  Advised to call if fevers/chills, erythema, induration, drainage, or persistent bleeding.

## 2015-02-14 ENCOUNTER — Ambulatory Visit: Payer: Self-pay

## 2015-02-14 ENCOUNTER — Encounter: Payer: Self-pay | Admitting: Family Medicine

## 2015-02-14 ENCOUNTER — Ambulatory Visit: Payer: Self-pay | Attending: Family Medicine | Admitting: Family Medicine

## 2015-02-14 VITALS — BP 136/88 | HR 74 | Temp 98.0°F | Resp 16 | Ht 60.0 in | Wt 115.0 lb

## 2015-02-14 DIAGNOSIS — Z23 Encounter for immunization: Secondary | ICD-10-CM

## 2015-02-14 DIAGNOSIS — F17210 Nicotine dependence, cigarettes, uncomplicated: Secondary | ICD-10-CM | POA: Insufficient documentation

## 2015-02-14 MED ORDER — VARENICLINE TARTRATE 0.5 MG X 11 & 1 MG X 42 PO MISC: ORAL | Status: DC

## 2015-02-14 MED ORDER — VARENICLINE TARTRATE 1 MG PO TABS: 1.0000 mg | ORAL_TABLET | Freq: Two times a day (BID) | ORAL | Status: DC

## 2015-02-14 NOTE — Progress Notes (Signed)
   Subjective:    Patient ID: Lori Bush, female    DOB: May 16, 1961, 54 y.o.   MRN: [id number] CC: f/u smoking  Nicotine Dependence Presents for follow-up visit. Preferred tobacco types include cigarettes. Preferred cigarette types include filtered. Preferred strength is regular. Preferred cigarettes are menthol. Preferred brands include Newport. Her urge triggers include company of smokers. The symptoms have been improving. Her first smoke is from 8 to 10 AM. She smokes < 1/2 a pack of cigarettes per day. Past treatments include nothing. Compliance with prior treatments: has not yet started chantix  Lori Bush is ready to quit. There is no history of alcohol abuse and drug use.    History  Substance Use Topics  . Smoking status: Current Every Day Smoker -- 0.25 packs/day for 31 years    Types: Cigarettes  . Smokeless tobacco: Never Used     Comment: states she cut back 3 cigarettes/day  . Alcohol Use: No    Review of Systems  Constitutional: Negative for fever and chills.  Eyes: Negative for visual disturbance.  Respiratory: Negative for shortness of breath.   Cardiovascular: Negative for chest pain.  Gastrointestinal: Negative for abdominal pain and blood in stool.  Musculoskeletal: Negative for back pain and arthralgias.  Skin: Negative for rash.  Allergic/Immunologic: Negative for immunocompromised state.  Hematological: Negative for adenopathy. Does not bruise/bleed easily.  Psychiatric/Behavioral: Negative for suicidal ideas and dysphoric mood.      Objective:   Physical Exam  Constitutional: She is oriented to person, place, and time. She appears well-developed and well-nourished. No distress.  HENT:  Mouth/Throat: Uvula is midline, oropharynx is clear and moist and mucous membranes are normal. Abnormal dentition.  Cardiovascular: Normal rate, regular rhythm, normal heart sounds and intact distal pulses.   Pulmonary/Chest: Effort normal and breath sounds normal.    Musculoskeletal: She exhibits no edema.  Neurological: She is alert and oriented to person, place, and time.  Skin: Skin is warm and dry. No rash noted.  Psychiatric: She has a normal mood and affect.       Assessment & Plan:

## 2015-02-14 NOTE — Progress Notes (Signed)
F/U smoking cessation Smocking 5 cigarette per day

## 2015-02-14 NOTE — Patient Instructions (Signed)
Lori Bush,  Thank you for coming in today.  Smoking cessation support: smoking cessation hotline: 1-800-QUIT-NOW.  Smoking cessation classes are available through [facility]. Call [phone] or visit our website at [email or web address]  Start chantix: Take one 0.5 mg tablet by mouth once daily for 3 days, then increase to one 0.5 mg tablet twice daily for 4 days, then increase to one 1 mg tablet twice daily. Set quit date for 8-35 days after starting chantix.  F/u in 6 weeks for smoking   Dr. Adrian Blackwater

## 2015-02-14 NOTE — Assessment & Plan Note (Signed)
A: smoking, tapering down. Has not yet started chantix. No samples in pharmacy. Patient advised to apply for PASS.   P:  Smoking cessation support: smoking cessation hotline: 1-800-QUIT-NOW.  Smoking cessation classes are available through [facility]. Call [phone] or visit our website at [email or web address]  Start chantix: Take one 0.5 mg tablet by mouth once daily for 3 days, then increase to one 0.5 mg tablet twice daily for 4 days, then increase to one 1 mg tablet twice daily. Set quit date for 8-35 days after starting chantix.  F/u in 6 weeks for smoking

## 2015-02-15 ENCOUNTER — Encounter: Payer: Self-pay | Admitting: Sports Medicine

## 2015-02-28 ENCOUNTER — Ambulatory Visit: Payer: Self-pay | Admitting: Sports Medicine

## 2015-05-17 ENCOUNTER — Other Ambulatory Visit: Payer: Self-pay | Admitting: Family Medicine

## 2015-06-27 ENCOUNTER — Other Ambulatory Visit: Payer: Self-pay | Admitting: Family Medicine

## 2015-06-29 ENCOUNTER — Telehealth: Payer: Self-pay | Admitting: Family Medicine

## 2015-06-29 NOTE — Telephone Encounter (Signed)
No medicine refilled Medication Discontinue

## 2015-06-29 NOTE — Telephone Encounter (Signed)
Medication Refill: cloNIDine (CATAPRES) tablet 0.2 mg  [id number]

## 2015-06-30 ENCOUNTER — Encounter: Payer: Self-pay | Admitting: Family Medicine

## 2015-08-01 ENCOUNTER — Ambulatory Visit: Payer: Self-pay | Attending: Family Medicine | Admitting: Family Medicine

## 2015-08-01 ENCOUNTER — Encounter: Payer: Self-pay | Admitting: Family Medicine

## 2015-08-01 VITALS — BP 138/80 | HR 80 | Temp 98.3°F | Resp 16 | Ht 60.0 in | Wt 122.0 lb

## 2015-08-01 DIAGNOSIS — F17210 Nicotine dependence, cigarettes, uncomplicated: Secondary | ICD-10-CM | POA: Insufficient documentation

## 2015-08-01 DIAGNOSIS — R05 Cough: Secondary | ICD-10-CM | POA: Insufficient documentation

## 2015-08-01 DIAGNOSIS — Z79899 Other long term (current) drug therapy: Secondary | ICD-10-CM | POA: Insufficient documentation

## 2015-08-01 DIAGNOSIS — Z0000 Encounter for general adult medical examination without abnormal findings: Secondary | ICD-10-CM

## 2015-08-01 DIAGNOSIS — R053 Chronic cough: Secondary | ICD-10-CM | POA: Insufficient documentation

## 2015-08-01 LAB — POCT GLYCOSYLATED HEMOGLOBIN (HGB A1C): HEMOGLOBIN A1C: 5.7

## 2015-08-01 MED ORDER — FLUTICASONE PROPIONATE 50 MCG/ACT NA SUSP: 2.0000 | Freq: Every day | NASAL | Status: DC

## 2015-08-01 MED ORDER — ALBUTEROL SULFATE HFA 108 (90 BASE) MCG/ACT IN AERS: 2.0000 | INHALATION_SPRAY | RESPIRATORY_TRACT | Status: DC | PRN

## 2015-08-01 MED ORDER — CETIRIZINE HCL 10 MG PO TABS: 10.0000 mg | ORAL_TABLET | Freq: Every day | ORAL | Status: DC

## 2015-08-01 MED FILL — ?CETIRIZINE HCL 10 MG TABLE: 10 | 30 days supply | Qty: 30 | Fill #0

## 2015-08-01 NOTE — Assessment & Plan Note (Signed)
A; post viral vs allergic vs COPD P: Albuterol CXR flonase Zyrtec Smoking cessation

## 2015-08-01 NOTE — Progress Notes (Signed)
Subjective:  Patient ID: Lori Bush, female    DOB: Nov 25, 1960  Age: 55 y.o. MRN: [id number]  CC: Cough   HPI Verginia Metheney presents for    1. Cough: x 2 months. Started with sinus congestion as well. No fever or chills. Cough is non productive. No CP or SOB. Smoking.   Social History  Substance Use Topics  . Smoking status: Current Every Day Smoker -- 0.25 packs/day for 31 years    Types: Cigarettes  . Smokeless tobacco: Never Used     Comment: states she cut back 3 cigarettes/day  . Alcohol Use: No    Outpatient Prescriptions Prior to Visit  Medication Sig Dispense Refill  . albuterol (PROVENTIL HFA;VENTOLIN HFA) 108 (90 BASE) MCG/ACT inhaler Inhale 2 puffs into the lungs every 4 (four) hours as needed for wheezing or shortness of breath. 1 Inhaler 0  . amLODipine (NORVASC) 10 MG tablet TAKE 1 TABLET BY MOUTH DAILY 30 tablet 2  . lisinopril (PRINIVIL,ZESTRIL) 40 MG tablet Take 1 tablet (40 mg total) by mouth daily. 90 tablet 3  . fluticasone (FLONASE) 50 MCG/ACT nasal spray Place 2 sprays into both nostrils at bedtime. (Patient not taking: Reported on 08/01/2015) 16 g 2  . Multiple Vitamins-Minerals (MULTIVITAL) CHEW Chew 1 tablet by mouth daily. Reported on 08/01/2015    . naproxen (NAPROSYN) 500 MG tablet Take 1 tablet (500 mg total) by mouth 2 (two) times daily as needed. (Patient not taking: Reported on 08/01/2015) 30 tablet 1  . amLODipine (NORVASC) 10 MG tablet TAKE 1 TABLET BY MOUTH DAILY. 90 tablet 3  . varenicline (CHANTIX CONTINUING MONTH PAK) 1 MG tablet Take 1 tablet (1 mg total) by mouth 2 (two) times daily. 60 tablet 1  . varenicline (CHANTIX STARTING MONTH PAK) 0.5 MG X 11 & 1 MG X 42 tablet Taper per packet insert 53 tablet 0   No facility-administered medications prior to visit.    ROS Review of Systems  Constitutional: Negative for fever and chills.  Eyes: Negative for visual disturbance.  Respiratory: Positive for cough. Negative for  shortness of breath.   Cardiovascular: Negative for chest pain.  Gastrointestinal: Negative for abdominal pain and blood in stool.  Musculoskeletal: Negative for back pain and arthralgias.  Skin: Negative for rash.  Allergic/Immunologic: Negative for immunocompromised state.  Hematological: Negative for adenopathy. Does not bruise/bleed easily.  Psychiatric/Behavioral: Negative for suicidal ideas and dysphoric mood.    Objective:  BP 138/80 mmHg  Pulse 80  Temp(Src) 98.3 F (36.8 C) (Oral)  Resp 16  Ht 5' (1.524 m)  Wt 122 lb (55.339 kg)  BMI 23.83 kg/m2  SpO2 97%  BP/Weight 08/01/2015 02/14/2015 [id number]  Systolic BP 0000000 [id number] 0000000  Diastolic BP 80 88 96  Wt. (Lbs) 122 115 115  BMI 23.83 22.46 22.46    Physical Exam  Constitutional: She is oriented to person, place, and time. She appears well-developed and well-nourished. No distress.  HENT:  Head: Normocephalic and atraumatic.  Nose: Mucosal edema present.  Cardiovascular: Normal rate, regular rhythm, normal heart sounds and intact distal pulses.   Pulmonary/Chest: Effort normal and breath sounds normal.  Musculoskeletal: She exhibits no edema.  Neurological: She is alert and oriented to person, place, and time.  Skin: Skin is warm and dry. No rash noted.  Psychiatric: She has a normal mood and affect.   Assessment & Plan:  Jolana was seen today for cough.  Diagnoses and all orders for this visit:  Healthcare maintenance -  POCT glycosylated hemoglobin (Hb A1C) -     MM DIGITAL SCREENING BILATERAL; Future  Chronic cough -     DG Chest 2 View; Future -     cetirizine (ZYRTEC) 10 MG tablet; Take 1 tablet (10 mg total) by mouth daily. -     Discontinue: albuterol (PROVENTIL HFA;VENTOLIN HFA) 108 (90 Base) MCG/ACT inhaler; Inhale 2 puffs into the lungs every 4 (four) hours as needed for wheezing or shortness of breath. -     fluticasone (FLONASE) 50 MCG/ACT nasal spray; Place 2 sprays into both nostrils at  bedtime. -     albuterol (PROVENTIL HFA;VENTOLIN HFA) 108 (90 Base) MCG/ACT inhaler; Inhale 2 puffs into the lungs every 4 (four) hours as needed for wheezing or shortness of breath.    Follow-up: No Follow-up on file.   Boykin Nearing MD

## 2015-08-01 NOTE — Progress Notes (Signed)
C/C Chest Congestion form a couple of month  Dry cough sometime  No pain today  Tobacco user 1/2 ppday  No suicidal thought in the past two weeks   Requesting Mammogram referral

## 2015-08-01 NOTE — Patient Instructions (Addendum)
Lori Bush was seen today for cough.  Diagnoses and all orders for this visit:  Healthcare maintenance -     POCT glycosylated hemoglobin (Hb A1C) -     MM DIGITAL SCREENING BILATERAL; Future  Chronic cough -     DG Chest 2 View; Future -     cetirizine (ZYRTEC) 10 MG tablet; Take 1 tablet (10 mg total) by mouth daily. -     Discontinue: albuterol (PROVENTIL HFA;VENTOLIN HFA) 108 (90 Base) MCG/ACT inhaler; Inhale 2 puffs into the lungs every 4 (four) hours as needed for wheezing or shortness of breath. -     fluticasone (FLONASE) 50 MCG/ACT nasal spray; Place 2 sprays into both nostrils at bedtime. -     albuterol (PROVENTIL HFA;VENTOLIN HFA) 108 (90 Base) MCG/ACT inhaler; Inhale 2 puffs into the lungs every 4 (four) hours as needed for wheezing or shortness of breath.   You are due for colonoscopy think about this and let me know if I can place a referral to GI at your follow up  F/u in 4 months for cough   Dr. Adrian Blackwater   Colonoscopy A colonoscopy is an exam to look at the entire large intestine (colon). This exam can help find problems such as tumors, polyps, inflammation, and areas of bleeding. The exam takes about 1 hour.  LET [facility] CARE PROVIDER KNOW ABOUT:   Any allergies you have.  All medicines you are taking, including vitamins, herbs, eye drops, creams, and over-the-counter medicines.  Previous problems you or members of your family have had with the use of anesthetics.  Any blood disorders you have.  Previous surgeries you have had.  Medical conditions you have. RISKS AND COMPLICATIONS  Generally, this is a safe procedure. However, as with any procedure, complications can occur. Possible complications include:  Bleeding.  Tearing or rupture of the colon wall.  Reaction to medicines given during the exam.  Infection (rare). BEFORE THE PROCEDURE   Ask your health care provider about changing or stopping your regular medicines.  You may be prescribed an  oral bowel prep. This involves drinking a large amount of medicated liquid, starting the day before your procedure. The liquid will cause you to have multiple loose stools until your stool is almost clear or light green. This cleans out your colon in preparation for the procedure.  Do not eat or drink anything else once you have started the bowel prep, unless your health care provider tells you it is safe to do so.  Arrange for someone to drive you home after the procedure. PROCEDURE   You will be given medicine to help you relax (sedative).  You will lie on your side with your knees bent.  A long, flexible tube with a light and camera on the end (colonoscope) will be inserted through the rectum and into the colon. The camera sends video back to a computer screen as it moves through the colon. The colonoscope also releases carbon dioxide gas to inflate the colon. This helps your health care provider see the area better.  During the exam, your health care provider may take a small tissue sample (biopsy) to be examined under a microscope if any abnormalities are found.  The exam is finished when the entire colon has been viewed. AFTER THE PROCEDURE   Do not drive for 24 hours after the exam.  You may have a small amount of blood in your stool.  You may pass moderate amounts of gas and have mild  abdominal cramping or bloating. This is caused by the gas used to inflate your colon during the exam.  Ask when your test results will be ready and how you will get your results. Make sure you get your test results.   This information is not intended to replace advice given to you by your health care provider. Make sure you discuss any questions you have with your health care provider.   Document Released: 06/21/2000 Document Revised: 04/14/2013 Document Reviewed: 03/01/2013 Elsevier Interactive Patient Education [employer].

## 2015-08-02 ENCOUNTER — Other Ambulatory Visit: Payer: Self-pay | Admitting: Family Medicine

## 2015-08-02 DIAGNOSIS — Z1231 Encounter for screening mammogram for malignant neoplasm of breast: Secondary | ICD-10-CM

## 2015-08-08 ENCOUNTER — Other Ambulatory Visit: Payer: Self-pay | Admitting: Family Medicine

## 2015-08-08 MED FILL — LISINOPRIL 40 MG TABLET: 40 | 30 days supply | Qty: 30 | Fill #0

## 2015-10-25 MED FILL — AMLODIPINE BESYLATE 10 MG T: 10 | 90 days supply | Qty: 90 | Fill #1

## 2015-10-25 MED FILL — LISINOPRIL 40 MG TABLET: 40 | 30 days supply | Qty: 30 | Fill #1

## 2015-11-28 MED FILL — LISINOPRIL 40 MG TABLET: 40 | 30 days supply | Qty: 30 | Fill #2

## 2016-01-10 MED FILL — LISINOPRIL 40 MG TABLET: 40 | 30 days supply | Qty: 30 | Fill #3

## 2016-02-13 MED FILL — ?AMLODIPINE BESYLATE 10 MG: 10 | 30 days supply | Qty: 30 | Fill #2

## 2016-02-13 MED FILL — LISINOPRIL 40 MG TABLET: 40 | 30 days supply | Qty: 30 | Fill #4

## 2016-02-19 ENCOUNTER — Telehealth: Payer: Self-pay | Admitting: Family Medicine

## 2016-02-19 DIAGNOSIS — I10 Essential (primary) hypertension: Secondary | ICD-10-CM

## 2016-02-19 NOTE — Telephone Encounter (Signed)
Pt. Called requesting to speak with her PCP b/c she called to schedule an appointment for her cough and was told that her PCP did not have anything available for the rest of the month. Pt. Was advised to call back tomorrow to schedule her for the walk-in schedule and pt. Requested to speak with her PCP. Please f/u with pt.

## 2016-02-21 NOTE — Telephone Encounter (Signed)
Will route to PCP 

## 2016-02-22 MED ORDER — LISINOPRIL 40 MG PO TABS: 40.0000 mg | ORAL_TABLET | Freq: Every day | ORAL | 3 refills | Status: DC

## 2016-02-22 MED ORDER — LOSARTAN POTASSIUM 50 MG PO TABS: 50.0000 mg | ORAL_TABLET | Freq: Every day | ORAL | 3 refills | Status: DC

## 2016-02-22 NOTE — Telephone Encounter (Signed)
Verified name and DOB.   She is having cough and congestion. No fever or CP.  Cough started back two weeks ago.   She is able to sleep. She is taking OTC Vicks daytime cold and flu which helps.   She is improving. She would like OV. She want to continue lisinopril stating that it is not causing her cough.  Plan: She is to call for OV Continue lisinopril and OTC medication.

## 2016-03-20 MED FILL — AMLODIPINE BESYLATE 10 MG T: 10 | 30 days supply | Qty: 30 | Fill #3

## 2016-03-20 MED FILL — LISINOPRIL 40 MG TABLET: 40 | 30 days supply | Qty: 30 | Fill #5

## 2016-03-22 ENCOUNTER — Ambulatory Visit: Payer: Self-pay | Attending: Family Medicine | Admitting: Family Medicine

## 2016-03-22 ENCOUNTER — Encounter: Payer: Self-pay | Admitting: Family Medicine

## 2016-03-22 ENCOUNTER — Other Ambulatory Visit: Payer: Self-pay | Admitting: Family Medicine

## 2016-03-22 ENCOUNTER — Telehealth: Payer: Self-pay | Admitting: Family Medicine

## 2016-03-22 VITALS — BP 152/93 | HR 84 | Temp 98.3°F | Ht 60.0 in | Wt 118.6 lb

## 2016-03-22 DIAGNOSIS — I10 Essential (primary) hypertension: Secondary | ICD-10-CM | POA: Insufficient documentation

## 2016-03-22 DIAGNOSIS — F17210 Nicotine dependence, cigarettes, uncomplicated: Secondary | ICD-10-CM | POA: Insufficient documentation

## 2016-03-22 DIAGNOSIS — R05 Cough: Secondary | ICD-10-CM | POA: Insufficient documentation

## 2016-03-22 DIAGNOSIS — B182 Chronic viral hepatitis C: Secondary | ICD-10-CM

## 2016-03-22 DIAGNOSIS — R053 Chronic cough: Secondary | ICD-10-CM

## 2016-03-22 DIAGNOSIS — Z79899 Other long term (current) drug therapy: Secondary | ICD-10-CM | POA: Insufficient documentation

## 2016-03-22 DIAGNOSIS — Z0000 Encounter for general adult medical examination without abnormal findings: Secondary | ICD-10-CM

## 2016-03-22 DIAGNOSIS — B353 Tinea pedis: Secondary | ICD-10-CM | POA: Insufficient documentation

## 2016-03-22 DIAGNOSIS — Z1159 Encounter for screening for other viral diseases: Secondary | ICD-10-CM

## 2016-03-22 MED ORDER — AMLODIPINE BESYLATE 10 MG PO TABS: 10.0000 mg | ORAL_TABLET | Freq: Every day | ORAL | 3 refills | Status: DC

## 2016-03-22 MED ORDER — KETOCONAZOLE 2 % EX CREA: 1.0000 "application " | TOPICAL_CREAM | Freq: Every day | CUTANEOUS | 5 refills | Status: DC

## 2016-03-22 MED ORDER — LOSARTAN POTASSIUM 50 MG PO TABS: 50.0000 mg | ORAL_TABLET | Freq: Every day | ORAL | 3 refills | Status: DC

## 2016-03-22 MED FILL — KETOCONAZOLE 2% CREAM: 2 | 15 days supply | Qty: 60 | Fill #0

## 2016-03-22 MED FILL — LOSARTAN POTASSIUM 50 MG TA: 50 | 30 days supply | Qty: 30 | Fill #0

## 2016-03-22 NOTE — Assessment & Plan Note (Signed)
A: HTN in smoker Med: compliant P: Stop lisinopril and change to losartan due to cough Continue amlodipine

## 2016-03-22 NOTE — Telephone Encounter (Signed)
Patient is needing a medication refill for albuterol and the multivitamin

## 2016-03-22 NOTE — Assessment & Plan Note (Signed)
nizoral cream 

## 2016-03-22 NOTE — Assessment & Plan Note (Signed)
Chronic cough in smoker Suspect early chronic bronchitis  Plan: Smoking cessation CXR

## 2016-03-22 NOTE — Progress Notes (Signed)
Subjective:  Patient ID: Lori Bush, female    DOB: Oct 14, 1960  Age: 55 y.o. MRN: [id number]  CC: Cough and Eczema   HPI Lori Bush presents for    1. Cough: x 11 months. She has intermittent productive cough. She is a longtime smoker. She has cut down to 6 cigs per day and plans to quit next month.  No CP or SOB. No fever or chills. No significant weight loss. She has not completed the CXR I ordered.   2. HTN: taking lisinopril and norvasc. No CP, SOB or swelling. Productive cough.   3. HM: declines flu. Amenable to hep C screen, C-scope, mammogram.   4. Dry skin on feet: dry skin with itching on feet. Worse on L foot. No redness or swelling.   Social History  Substance Use Topics  . Smoking status: Current Every Day Smoker    Packs/day: 0.25    Years: 31.00    Types: Cigarettes  . Smokeless tobacco: Never Used     Comment: states she cut back 3 cigarettes/day  . Alcohol use No    Outpatient Medications Prior to Visit  Medication Sig Dispense Refill  . albuterol (PROVENTIL HFA;VENTOLIN HFA) 108 (90 Base) MCG/ACT inhaler Inhale 2 puffs into the lungs every 4 (four) hours as needed for wheezing or shortness of breath. 1 Inhaler 0  . amLODipine (NORVASC) 10 MG tablet TAKE 1 TABLET BY MOUTH DAILY 30 tablet 2  . cetirizine (ZYRTEC) 10 MG tablet Take 1 tablet (10 mg total) by mouth daily. 30 tablet 11  . fluticasone (FLONASE) 50 MCG/ACT nasal spray Place 2 sprays into both nostrils at bedtime. 16 g 2  . lisinopril (PRINIVIL,ZESTRIL) 40 MG tablet Take 1 tablet (40 mg total) by mouth daily. 90 tablet 3  . Multiple Vitamins-Minerals (MULTIVITAL) CHEW Chew 1 tablet by mouth daily. Reported on 08/01/2015    . naproxen (NAPROSYN) 500 MG tablet Take 1 tablet (500 mg total) by mouth 2 (two) times daily as needed. (Patient not taking: Reported on 08/01/2015) 30 tablet 1   No facility-administered medications prior to visit.     ROS Review of Systems  Constitutional:  Negative for chills and fever.  Eyes: Negative for visual disturbance.  Respiratory: Positive for cough. Negative for shortness of breath.   Cardiovascular: Negative for chest pain.  Gastrointestinal: Negative for abdominal pain and blood in stool.  Musculoskeletal: Negative for arthralgias and back pain.  Skin: Negative for rash.  Allergic/Immunologic: Negative for immunocompromised state.  Hematological: Negative for adenopathy. Does not bruise/bleed easily.  Psychiatric/Behavioral: Negative for dysphoric mood and suicidal ideas.    Objective:  BP (!) 152/93 (BP Location: Right Arm, Patient Position: Sitting, Cuff Size: Small)   Pulse 84   Temp 98.3 F (36.8 C) (Oral)   Ht 5' (1.524 m)   Wt 118 lb 9.6 oz (53.8 kg)   SpO2 97%   BMI 23.16 kg/m   BP/Weight 03/22/2016 [id number]  Systolic BP 0000000 0000000 [id number]  Diastolic BP 93 80 88  Wt. (Lbs) 118.6 122 115  BMI 23.16 23.83 22.46    Physical Exam  Constitutional: She is oriented to person, place, and time. She appears well-developed and well-nourished. No distress.  HENT:  Head: Normocephalic and atraumatic.  Nose: Mucosal edema present.  Cardiovascular: Normal rate, regular rhythm, normal heart sounds and intact distal pulses.   Pulmonary/Chest: Effort normal and breath sounds normal.  Musculoskeletal: She exhibits no edema.       Feet:  Neurological:  She is alert and oriented to person, place, and time.  Skin: Skin is warm and dry. No rash noted.  Psychiatric: She has a normal mood and affect.   Assessment & Plan:  Lasheika was seen today for cough and eczema.  Diagnoses and all orders for this visit:  Essential hypertension -     amLODipine (NORVASC) 10 MG tablet; Take 1 tablet (10 mg total) by mouth daily. -     losartan (COZAAR) 50 MG tablet; Take 1 tablet (50 mg total) by mouth daily.  Healthcare maintenance -     MM DIGITAL SCREENING BILATERAL; Future -     Ambulatory referral to Gastroenterology  Need  for hepatitis C screening test -     Hepatitis C antibody, reflex  Tinea pedis of both feet -     ketoconazole (NIZORAL) 2 % cream; Apply 1 application topically daily.  Chronic cough -     DG Chest 2 View; Future    Follow-up: Return in about 4 weeks (around 04/19/2016) for HTN and chronic cough .   Boykin Nearing MD

## 2016-03-22 NOTE — Patient Instructions (Addendum)
Charlicia was seen today for cough and eczema.  Diagnoses and all orders for this visit:  Essential hypertension -     amLODipine (NORVASC) 10 MG tablet; Take 1 tablet (10 mg total) by mouth daily. -     losartan (COZAAR) 50 MG tablet; Take 1 tablet (50 mg total) by mouth daily.  Healthcare maintenance -     MM DIGITAL SCREENING BILATERAL; Future -     Ambulatory referral to Gastroenterology  Need for hepatitis C screening test -     Hepatitis C antibody, reflex  Tinea pedis of both feet -     ketoconazole (NIZORAL) 2 % cream; Apply 1 application topically daily.   F/u in 6 weeks for BP recheck and f/u cough  Dr. Adrian Blackwater

## 2016-03-23 LAB — HEPATITIS C ANTIBODY: HCV AB: REACTIVE — AB

## 2016-03-25 MED ORDER — MULTIVITAL PO CHEW: 1.0000 | CHEWABLE_TABLET | Freq: Every day | ORAL | 2 refills | Status: DC

## 2016-03-25 MED ORDER — ALBUTEROL SULFATE HFA 108 (90 BASE) MCG/ACT IN AERS: 2.0000 | INHALATION_SPRAY | RESPIRATORY_TRACT | 0 refills | Status: DC | PRN

## 2016-03-25 MED FILL — VENTOLIN HFA 90 MCG INHALER: 108 (90 BAS | 25 days supply | Qty: 18 | Fill #0

## 2016-03-25 NOTE — Telephone Encounter (Signed)
Requested medications refilled 

## 2016-03-26 DIAGNOSIS — B182 Chronic viral hepatitis C: Secondary | ICD-10-CM | POA: Insufficient documentation

## 2016-03-26 LAB — HEPATITIS C RNA QUANTITATIVE
HCV QUANT LOG: 6.05 {Log} — AB (ref <1.18)
HCV QUANT: 1132745 [IU]/mL — AB (ref <15)

## 2016-03-26 NOTE — Addendum Note (Signed)
Addended by: Boykin Nearing on: 03/26/2016 05:40 PM   Modules accepted: Orders

## 2016-05-07 MED FILL — LOSARTAN POTASSIUM 50 MG TA: 50 | 30 days supply | Qty: 30 | Fill #1

## 2016-05-07 MED FILL — ?AMLODIPINE BESYLATE 10 MG: 10 | 30 days supply | Qty: 30 | Fill #4

## 2016-05-16 ENCOUNTER — Telehealth: Payer: Self-pay | Admitting: *Deleted

## 2016-05-16 ENCOUNTER — Telehealth: Payer: Self-pay

## 2016-05-16 ENCOUNTER — Telehealth: Payer: Self-pay | Admitting: Family Medicine

## 2016-05-16 NOTE — Telephone Encounter (Signed)
Patient called back and given appt with Dr. Linus Salmons for 05/27/16. [name]

## 2016-05-16 NOTE — Telephone Encounter (Signed)
Call transferred from registration rep: [place].  Patient states she received a call today from a provider re: recent test results that she was unaware she was being tested for nor that she was positive.  Rn apologized for any inconvenience concerning the matter.  Patient accepted.   RN advised how liver condition is contracted as requested and advised transmitted via body fluid or blood.  Patient states she will contact provider she was referred to and schedule appointment

## 2016-05-16 NOTE — Telephone Encounter (Signed)
Called patient regarding referral from [place], Dr. Boykin Nearing for treatment of Hepatitis C. Patient stated she was not notified of this diagnosis. She stated she would call her PCP. Gave her [facility] call back number if she wishes to schedule a new patient appt with either Dr. Linus Salmons or Dr. Baxter Flattery. [name] CMA

## 2016-05-27 ENCOUNTER — Encounter: Payer: Self-pay | Admitting: Internal Medicine

## 2016-05-27 ENCOUNTER — Ambulatory Visit (INDEPENDENT_AMBULATORY_CARE_PROVIDER_SITE_OTHER): Payer: Self-pay | Admitting: Internal Medicine

## 2016-05-27 VITALS — Temp 98.3°F | Wt 115.0 lb

## 2016-05-27 DIAGNOSIS — B182 Chronic viral hepatitis C: Secondary | ICD-10-CM

## 2016-05-27 NOTE — Patient Instructions (Signed)
Date 05/27/16  Dear Ms Lori Bush, As discussed in the [facility], your hepatitis C therapy will include the following medications:          Zepatier (elbasvir 50 mg/grazoprevir 100 mg) for 12 weeks              OR      16 weeks with ribavirin in certain cases  OR equivalent medication  Please note that ALL MEDICATIONS WILL START ON THE SAME DATE for a total of 12 weeks. ---------------------------------------------------------------- Your HCV Treatment Start Date: TBA   Your HCV genotype:  unknown    Liver Fibrosis: TBD    ---------------------------------------------------------------- YOUR PHARMACY CONTACT:   Lori Bush Level [facility] and [place] Phone: [phone] Hours: Monday to Friday 7:30 am to 6:00 pm   Please always contact your pharmacy at least 3-4 business days before you run out of medications to ensure your next month's medication is ready or 1 week prior to running out if you receive it by mail.  Remember, each prescription is for 28 days. ---------------------------------------------------------------- GENERAL NOTES REGARDING YOUR HEPATITIS C MEDICATION:  ZEPATIER is available as a beige-colored, oval-shaped, film-coated tablet debossed with "770" on one side and plain on the other. Each tablet contains 50 mg elbasvir and 100 mg grazoprevir.   Common side effects of ZEPATIER when used without ribavirin include: - feeling tired -trouble sleeping - headache -diarrhea - nausea  Common side effects of ZEPATIER when used with ribavirin include: - low red blood cell counts (anemia) - feeling irritable - headache - stomach pain - feeling tired - depression - shortness of breath - joint pain - rash or itching   Please note that this only lists the most common side effects and is NOT a comprehensive list of the potential side effects of these medications. For more information, please review the drug  information sheets that come with your medication package from the pharmacy.  ---------------------------------------------------------------- GENERAL HELPFUL HINTS ON HCV THERAPY: 1. Stay well-hydrated. 2. Notify the ID Clinic of any changes in your other over-the-counter/herbal or prescription medications. 3. If you miss a dose of your medication, take the missed dose as soon as you remember. Return to your regular time/dose schedule the next day.  4.  Do not stop taking your medications without first talking with your healthcare provider. 5.  You may take Tylenol (acetaminophen), as long as the dose is less than 2000 mg (OR no more than 4 tablets of the Tylenol Extra Strengths 500mg  tablet) in 24 hours. 6.  You will see our pharmacist-specialist within the first 2 weeks of starting your medication. 7.  You will be scheduled for labs once during treatment, soon after treatment completion and 6 months or more after treatment completion to verify the virus is out of your system.   8.  If ribavirin is part of your regimen, you may have a lab visit every 2 weeks.   Lori Bush, [place] for Lori Bush, [place]  [phone]

## 2016-05-27 NOTE — Progress Notes (Signed)
[facility]   CC: consideration for treatment for chronic hepatitis C  HPI:  +Lori Bush is a 55 y.o. female who presents for initial evaluation and management of chronic hepatitis C.  Patient tested positive during routine screening this year. Hepatitis C-associated risk factors present are: none. Patient denies history of blood transfusion, history of clotting factor transfusion, intranasal drug use, IV drug abuse, multiple sexual partners, renal dialysis, sexual contact with person with liver disease, tattoos. Patient has had other studies performed. Results: hepatitis C RNA by PCR, result: positive. Patient has not had prior treatment for Hepatitis C. Patient does not have a past history of liver disease. Patient does not have a family history of liver disease. Patient does not  have associated signs or symptoms related to liver disease.  Labs reviewed and confirm chronic hepatitis C with a positive viral load.   Records reviewed from [facility].  Done as a screen.        Patient does not have documented immunity to Hepatitis A. Patient does not have documented immunity to Hepatitis B.    Review of Systems:  Constitutional: negative for fatigue, malaise and anorexia Cardiovascular: negative for dyspnea All other systems reviewed and are negative       Past Medical History:  Diagnosis Date  . Arthritis Dx 2012  . Hypertension Dx 2005    Prior to Admission medications   Medication Sig Start Date End Date Taking? Authorizing Provider  albuterol (PROVENTIL HFA;VENTOLIN HFA) 108 (90 Base) MCG/ACT inhaler Inhale 2 puffs into the lungs every 4 (four) hours as needed for wheezing or shortness of breath. 03/25/16  Yes Josalyn Funches, MD  amLODipine (NORVASC) 10 MG tablet Take 1 tablet (10 mg total) by mouth daily. 03/22/16  Yes Josalyn Funches, MD  cetirizine (ZYRTEC) 10 MG tablet Take 1 tablet (10 mg total) by mouth daily. 08/01/15  Yes Josalyn Funches, MD    fluticasone (FLONASE) 50 MCG/ACT nasal spray Place 2 sprays into both nostrils at bedtime. 08/01/15  Yes Josalyn Funches, MD  ketoconazole (NIZORAL) 2 % cream Apply 1 application topically daily. 03/22/16  Yes Josalyn Funches, MD  losartan (COZAAR) 50 MG tablet Take 1 tablet (50 mg total) by mouth daily. 03/22/16  Yes Boykin Nearing, MD  Multiple Vitamins-Minerals (MULTIVITAL) CHEW Chew 1 tablet by mouth daily. 03/25/16  Yes Josalyn Funches, MD  naproxen (NAPROSYN) 500 MG tablet Take 1 tablet (500 mg total) by mouth 2 (two) times daily as needed. 01/17/15  Yes Boykin Nearing, MD    No Known Allergies  Social History  Substance Use Topics  . Smoking status: Current Every Day Smoker    Packs/day: 0.25    Years: 31.00    Types: Cigarettes  . Smokeless tobacco: Never Used     Comment: states she cut back 3 cigarettes/day  . Alcohol use No    Family History  Problem Relation Age of Onset  . Hypertension Mother   . Heart disease Mother   . Hypertension Father   . Dementia Father   . Arthritis Sister   . Hypertension Sister   . Arthritis Brother   . Hypertension Brother   no cirrhosis, no liver cancer   Objective:  Constitutional: in no apparent distress and alert,  Vitals:   05/27/16 1010  Temp: 98.3 F (36.8 C)   Eyes: anicteric Cardiovascular: Cor RRR and No murmurs Respiratory: CTA B; normal respiratory effort Gastrointestinal: Bowel sounds are normal, liver is not enlarged, spleen is  not enlarged Musculoskeletal: no pedal edema noted Skin: negatives: no rash; no porphyria cutanea tarda Lymphatic: no cervical lymphadenopathy   Laboratory Genotype: No results found for: HCVGENOTYPE HCV viral load:  Lab Results  Component Value Date   HCVQUANT 1,132,745 (H) 03/22/2016   Lab Results  Component Value Date   WBC 6.2 04/16/2014   HGB 13.6 04/16/2014   HCT 39.6 04/16/2014   MCV 86.8 04/16/2014   PLT 257 04/16/2014    Lab Results  Component Value Date   CREATININE  0.83 04/16/2014   BUN 16 04/16/2014   NA 138 04/16/2014   K 3.6 (L) 04/16/2014   CL 102 04/16/2014   CO2 25 04/16/2014    Lab Results  Component Value Date   ALT 31 03/26/2014   AST 36 03/26/2014   ALKPHOS 66 03/26/2014     Labs and history reviewed and show CHILD-PUGH unknown  5-6 points: Child class A 7-9 points: Child class B 10-15 points: Child class C  Lab Results  Component Value Date   BILITOT 0.3 03/26/2014   ALBUMIN 3.8 03/26/2014     Assessment: New Patient with Chronic Hepatitis C genotype unknown, untreated.  I discussed with the patient the lab findings that confirm chronic hepatitis C as well as the natural history and progression of disease including about 30% of people who develop cirrhosis of the liver if left untreated and once cirrhosis is established there is a 2-7% risk per year of liver cancer and liver failure.  I discussed the importance of treatment and benefits in reducing the risk, even if significant liver fibrosis exists.   Plan: 1) Patient counseled extensively on limiting acetaminophen to no more than 2 grams daily, avoidance of alcohol. 2) Transmission discussed with patient including sexual transmission, sharing razors and toothbrush.   3) Will need referral to gastroenterology if concern for cirrhosis 4) Will need referral for substance abuse counseling: No.; Further work up to include urine drug screen  No. 5) Will prescribe Zepatier for 12 weeks or 16 weeks with ribavirin if any NS5A resistance found 6) Hepatitis A, B titers 8) Pneumovax vaccine if concern for cirrhosis 9) Further work up to include liver staging with elastography 10) NS5A test  Yes.   11) will follow up after starting medication. Will first get her [facility] to help with labs, ultrasound.  Will then get labs and depending on genotype get her appropriate treatment.

## 2016-06-04 ENCOUNTER — Ambulatory Visit: Payer: Self-pay | Attending: Family Medicine

## 2016-06-18 ENCOUNTER — Other Ambulatory Visit: Payer: Self-pay | Admitting: Family Medicine

## 2016-06-18 MED FILL — LOSARTAN POTASSIUM 50 MG TA: 50 | 30 days supply | Qty: 30 | Fill #2

## 2016-06-20 MED FILL — AMLODIPINE BESYLATE 10 MG T: 10 | 30 days supply | Qty: 30 | Fill #0

## 2016-08-05 MED FILL — AMLODIPINE BESYLATE 10 MG T: 10 | 30 days supply | Qty: 30 | Fill #1

## 2016-08-05 MED FILL — LOSARTAN POTASSIUM 50 MG TA: 50 | 30 days supply | Qty: 30 | Fill #3

## 2016-09-09 MED FILL — LOSARTAN POTASSIUM 50 MG TA: 50 | 30 days supply | Qty: 30 | Fill #4

## 2016-09-09 MED FILL — AMLODIPINE BESYLATE 10 MG T: 10 | 30 days supply | Qty: 30 | Fill #2

## 2016-10-17 MED FILL — AMLODIPINE BESYLATE 10 MG T: 10 | 30 days supply | Qty: 30 | Fill #0

## 2016-10-17 MED FILL — LOSARTAN POTASSIUM 50 MG TA: 50 | 30 days supply | Qty: 30 | Fill #5

## 2016-10-28 ENCOUNTER — Ambulatory Visit: Payer: Self-pay | Attending: Family Medicine | Admitting: Family Medicine

## 2016-10-28 ENCOUNTER — Encounter: Payer: Self-pay | Admitting: Family Medicine

## 2016-10-28 VITALS — BP 169/93 | HR 77 | Temp 98.6°F | Ht 60.0 in | Wt 118.0 lb

## 2016-10-28 DIAGNOSIS — G8929 Other chronic pain: Secondary | ICD-10-CM

## 2016-10-28 DIAGNOSIS — F17210 Nicotine dependence, cigarettes, uncomplicated: Secondary | ICD-10-CM | POA: Insufficient documentation

## 2016-10-28 DIAGNOSIS — B182 Chronic viral hepatitis C: Secondary | ICD-10-CM | POA: Insufficient documentation

## 2016-10-28 DIAGNOSIS — Z79899 Other long term (current) drug therapy: Secondary | ICD-10-CM | POA: Insufficient documentation

## 2016-10-28 DIAGNOSIS — M545 Low back pain: Secondary | ICD-10-CM | POA: Insufficient documentation

## 2016-10-28 DIAGNOSIS — I10 Essential (primary) hypertension: Secondary | ICD-10-CM | POA: Insufficient documentation

## 2016-10-28 DIAGNOSIS — M1711 Unilateral primary osteoarthritis, right knee: Secondary | ICD-10-CM | POA: Insufficient documentation

## 2016-10-28 DIAGNOSIS — M25511 Pain in right shoulder: Secondary | ICD-10-CM

## 2016-10-28 MED ORDER — METHYLPREDNISOLONE ACETATE 40 MG/ML IJ SUSP
40.0000 mg | Freq: Once | INTRAMUSCULAR | Status: AC
  Administered 2016-10-28: 40 mg via INTRAMUSCULAR

## 2016-10-28 MED ORDER — LOSARTAN POTASSIUM 50 MG PO TABS: 50.0000 mg | ORAL_TABLET | Freq: Every day | ORAL | 5 refills | Status: DC

## 2016-10-28 MED ORDER — AMLODIPINE BESYLATE 10 MG PO TABS: 10.0000 mg | ORAL_TABLET | Freq: Every day | ORAL | 11 refills | Status: DC

## 2016-10-28 NOTE — Patient Instructions (Addendum)
Lori Bush was seen today for back pain and leg pain.  Diagnoses and all orders for this visit:  Chronic hepatitis C without hepatic coma (HCC)  Essential hypertension -     amLODipine (NORVASC) 10 MG tablet; Take 1 tablet (10 mg total) by mouth daily. -     losartan (COZAAR) 50 MG tablet; Take 1 tablet (50 mg total) by mouth daily.  Primary osteoarthritis of right knee -     methylPREDNISolone acetate (DEPO-MEDROL) injection 40 mg; Inject 1 mL (40 mg total) into the muscle once.  You have received a shot of steroid in your joint today. Rest and ice knee today. Regular activity tomorrow. Look out for redness, swelling, fever,severe pain in joint and call if you experience these symptoms.   For HTN Start losartan 50 mg daily this has been ordered to replace the lisinopril you used to take. Lisinopril was stopped due to cough  Please call back to ID clinic for [place] for [place] [address] [place], [place]  [address] [phone]  f/u in 4 weeks for BP check and pap smear  Dr. Adrian Blackwater

## 2016-10-28 NOTE — Assessment & Plan Note (Signed)
Steroid injection done today

## 2016-10-28 NOTE — Assessment & Plan Note (Signed)
A: elevated BP P; Start losartan 50 mg daily

## 2016-10-28 NOTE — Assessment & Plan Note (Signed)
Referral back to ID clinic Patient to call for appt

## 2016-10-28 NOTE — Progress Notes (Signed)
Subjective:  Patient ID: Lori Bush, female    DOB: 22-Sep-1960  Age: 56 y.o. MRN: [id number]  CC: Back Pain and Leg Pain   HPI Lori Bush has HTN, chronic hep C, smoker she presents for   1. BACK PAIN  Location: R low back Quality: nagging, aching pain, 7/10 pain Onset: x 3 weeks  Worse with: standing  Better with: ice and heat and advil Radiation: R hip, anterior and posterior knee.  Trauma: none  Best sitting/standing/leaning forward: sitting   Red Flags Fecal/urinary incontinence: no  Numbness/Weakness: numbness down R leg when sitting on toilet for prolonged period  Fever/chills/sweats: no  Night pain: no  Unexplained weight loss: no  No relief with bedrest: no  h/o cancer/immunosuppression: no  IV drug use: no  PMH of osteoporosis or chronic steroid use: no    2. Hep C: this is chronic. She has seen ID clinic. She is uninsured and worried about affording the treatment. She has applied for and been declined for [facility] discount and orange card.   3. HTN: taking Norvasc 10 mg daily. Not taking losartan. Cough has improved since stopping lisinopril.   Social History  Substance Use Topics  . Smoking status: Current Every Day Smoker    Packs/day: 0.25    Years: 31.00    Types: Cigarettes  . Smokeless tobacco: Never Used     Comment: states she cut back 3 cigarettes/day  . Alcohol use No    Outpatient Medications Prior to Visit  Medication Sig Dispense Refill  . albuterol (PROVENTIL HFA;VENTOLIN HFA) 108 (90 Base) MCG/ACT inhaler Inhale 2 puffs into the lungs every 4 (four) hours as needed for wheezing or shortness of breath. 1 Inhaler 0  . amLODipine (NORVASC) 10 MG tablet Take 1 tablet (10 mg total) by mouth daily. 90 tablet 3  . cetirizine (ZYRTEC) 10 MG tablet Take 1 tablet (10 mg total) by mouth daily. 30 tablet 11  . fluticasone (FLONASE) 50 MCG/ACT nasal spray Place 2 sprays into both nostrils at bedtime. 16 g 2  . amLODipine (NORVASC) 10  MG tablet TAKE 1 TABLET BY MOUTH DAILY. (Patient not taking: Reported on 10/28/2016) 90 tablet 0  . ketoconazole (NIZORAL) 2 % cream Apply 1 application topically daily. (Patient not taking: Reported on 10/28/2016) 60 g 5  . losartan (COZAAR) 50 MG tablet Take 1 tablet (50 mg total) by mouth daily. (Patient not taking: Reported on 10/28/2016) 90 tablet 3  . Multiple Vitamins-Minerals (MULTIVITAL) CHEW Chew 1 tablet by mouth daily. (Patient not taking: Reported on 10/28/2016) 30 tablet 2  . naproxen (NAPROSYN) 500 MG tablet Take 1 tablet (500 mg total) by mouth 2 (two) times daily as needed. (Patient not taking: Reported on 10/28/2016) 30 tablet 1   No facility-administered medications prior to visit.     ROS Review of Systems  Constitutional: Negative for chills and fever.  Eyes: Negative for visual disturbance.  Respiratory: Negative for shortness of breath.   Cardiovascular: Negative for chest pain.  Gastrointestinal: Negative for abdominal pain and blood in stool.  Musculoskeletal: Positive for arthralgias, back pain and joint swelling.  Skin: Negative for rash.  Allergic/Immunologic: Negative for immunocompromised state.  Hematological: Negative for adenopathy. Does not bruise/bleed easily.  Psychiatric/Behavioral: Negative for dysphoric mood and suicidal ideas.    Objective:  BP (!) 169/93   Pulse 77   Temp 98.6 F (37 C) (Oral)   Ht 5' (1.524 m)   Wt 118 lb (53.5 kg)  SpO2 97%   BMI 23.05 kg/m   BP/Weight 10/28/2016 05/27/2016 11/01/621  Systolic BP 762 - 831  Diastolic BP 93 - 93  Wt. (Lbs) 118 115 118.6  BMI 23.05 22.46 23.16    Physical Exam  Constitutional: She is oriented to person, place, and time. She appears well-developed and well-nourished. No distress.  HENT:  Head: Normocephalic and atraumatic.  Cardiovascular: Normal rate, regular rhythm, normal heart sounds and intact distal pulses.   Pulmonary/Chest: Effort normal and breath sounds normal.    Musculoskeletal: She exhibits no edema.       Right knee: She exhibits decreased range of motion. She exhibits no swelling and no effusion. Tenderness found. Medial joint line and lateral joint line tenderness noted.  Back Exam: Back: Normal Curvature, no deformities or CVA tenderness  Paraspinal Tenderness: L lumbar   LE Strength 5/5  LE Sensation: in tact  LE Reflexes 2+ and symmetric  Straight leg raise: negative    Neurological: She is alert and oriented to person, place, and time.  Skin: Skin is warm and dry. No rash noted.  Psychiatric: She has a normal mood and affect.   After obtaining informed consent and cleaning the skin using iodine and alcohol a  steroid injection was performed at R knee.  Using 4:1 , 1 ml of  1% plain Lidocaine and 40 mg/ml of Depo Medrol. 23 gauge needled. This was well tolerated.  Assessment & Plan:  Lori Bush was seen today for back pain and leg pain.  Diagnoses and all orders for this visit:  Chronic hepatitis C without hepatic coma (HCC)  Essential hypertension -     amLODipine (NORVASC) 10 MG tablet; Take 1 tablet (10 mg total) by mouth daily. -     losartan (COZAAR) 50 MG tablet; Take 1 tablet (50 mg total) by mouth daily.  Primary osteoarthritis of right knee -     methylPREDNISolone acetate (DEPO-MEDROL) injection 40 mg; Inject 1 mL (40 mg total) into the muscle once.   There are no diagnoses linked to this encounter.  No orders of the defined types were placed in this encounter.   Follow-up: Return in about 4 weeks (around 11/25/2016) for HTN and pap smear .   Boykin Nearing MD

## 2016-11-20 ENCOUNTER — Encounter: Payer: Self-pay | Admitting: Family Medicine

## 2016-11-28 ENCOUNTER — Encounter: Payer: Self-pay | Admitting: Family Medicine

## 2016-11-28 ENCOUNTER — Ambulatory Visit: Payer: Self-pay | Attending: Family Medicine | Admitting: Family Medicine

## 2016-11-28 VITALS — BP 163/92 | HR 68 | Temp 98.0°F | Wt 118.0 lb

## 2016-11-28 DIAGNOSIS — Z7951 Long term (current) use of inhaled steroids: Secondary | ICD-10-CM | POA: Insufficient documentation

## 2016-11-28 DIAGNOSIS — N898 Other specified noninflammatory disorders of vagina: Secondary | ICD-10-CM | POA: Insufficient documentation

## 2016-11-28 DIAGNOSIS — F17210 Nicotine dependence, cigarettes, uncomplicated: Secondary | ICD-10-CM | POA: Insufficient documentation

## 2016-11-28 DIAGNOSIS — I10 Essential (primary) hypertension: Secondary | ICD-10-CM | POA: Insufficient documentation

## 2016-11-28 DIAGNOSIS — Z01419 Encounter for gynecological examination (general) (routine) without abnormal findings: Secondary | ICD-10-CM | POA: Insufficient documentation

## 2016-11-28 DIAGNOSIS — R062 Wheezing: Secondary | ICD-10-CM | POA: Insufficient documentation

## 2016-11-28 DIAGNOSIS — Z124 Encounter for screening for malignant neoplasm of cervix: Secondary | ICD-10-CM

## 2016-11-28 DIAGNOSIS — B182 Chronic viral hepatitis C: Secondary | ICD-10-CM | POA: Insufficient documentation

## 2016-11-28 LAB — HEMOCCULT GUIAC POC 1CARD (OFFICE): Fecal Occult Blood, POC: NEGATIVE

## 2016-11-28 MED ORDER — LOSARTAN POTASSIUM 100 MG PO TABS: 100.0000 mg | ORAL_TABLET | Freq: Every day | ORAL | 3 refills | Status: DC

## 2016-11-28 MED ORDER — LOSARTAN POTASSIUM-HCTZ 50-12.5 MG PO TABS: 1.0000 | ORAL_TABLET | Freq: Every day | ORAL | 3 refills | Status: DC

## 2016-11-28 MED ORDER — AMLODIPINE BESYLATE 10 MG PO TABS: 10.0000 mg | ORAL_TABLET | Freq: Every day | ORAL | 3 refills | Status: DC

## 2016-11-28 MED FILL — AMLODIPINE BESYLATE 10 MG T: 10 | 90 days supply | Qty: 90 | Fill #0

## 2016-11-28 MED FILL — LOSARTAN-HCTZ 50-12.5 MG TA: 50-12.5 | 90 days supply | Qty: 90 | Fill #0

## 2016-11-28 NOTE — Progress Notes (Signed)
Subjective:  Patient ID: Lori Bush, female    DOB: 03-01-1961  Age: 56 y.o. MRN: [id number]  CC: Hypertension and Gynecologic Exam   HPI Jeralynn Vaquera has HTN, chronic hep C, smoker she presents for   1. Pap smear: no abnormal pap smears. Has some vaginal discharge. No itching or bleeding. She has not been sexually active for 4 years. She is uninsured but makes too much for orange card program. Cannot afford to see GI for c-scope. Cannot afford mammogram. She is amenable to colon cancer screening and mammogram.   2. HTN: taking Norvasc 10 mg daily and losartan 50 mg daily. Smoking 6-8 cigs per day. Works as a Scientist, clinical (histocompatibility and immunogenetics). Eats a low salt diet. Does not exercise outside of work.   Social History  Substance Use Topics  . Smoking status: Current Every Day Smoker    Packs/day: 0.25    Years: 31.00    Types: Cigarettes  . Smokeless tobacco: Never Used     Comment: states she cut back 3 cigarettes/day  . Alcohol use No    Outpatient Medications Prior to Visit  Medication Sig Dispense Refill  . albuterol (PROVENTIL HFA;VENTOLIN HFA) 108 (90 Base) MCG/ACT inhaler Inhale 2 puffs into the lungs every 4 (four) hours as needed for wheezing or shortness of breath. 1 Inhaler 0  . amLODipine (NORVASC) 10 MG tablet Take 1 tablet (10 mg total) by mouth daily. 30 tablet 11  . cetirizine (ZYRTEC) 10 MG tablet Take 1 tablet (10 mg total) by mouth daily. 30 tablet 11  . fluticasone (FLONASE) 50 MCG/ACT nasal spray Place 2 sprays into both nostrils at bedtime. 16 g 2  . losartan (COZAAR) 50 MG tablet Take 1 tablet (50 mg total) by mouth daily. 30 tablet 5  . Multiple Vitamins-Minerals (MULTIVITAL) CHEW Chew 1 tablet by mouth daily. (Patient not taking: Reported on 10/28/2016) 30 tablet 2   No facility-administered medications prior to visit.     ROS Review of Systems  Constitutional: Negative for chills and fever.  Eyes: Negative for visual disturbance.  Respiratory:  Negative for shortness of breath.   Cardiovascular: Negative for chest pain.  Gastrointestinal: Negative for abdominal pain and blood in stool.  Genitourinary: Positive for vaginal discharge.  Skin: Negative for rash.  Allergic/Immunologic: Negative for immunocompromised state.  Hematological: Negative for adenopathy. Does not bruise/bleed easily.  Psychiatric/Behavioral: Negative for dysphoric mood and suicidal ideas.    Objective:  BP (!) 163/92   Pulse 68   Temp 98 F (36.7 C) (Oral)   Wt 118 lb (53.5 kg)   SpO2 97%   BMI 23.05 kg/m   BP/Weight 11/28/2016 10/28/2016 58/52/7782  Systolic BP 423 536 -  Diastolic BP 92 93 -  Wt. (Lbs) 118 118 115  BMI 23.05 23.05 22.46    Physical Exam  Constitutional: She is oriented to person, place, and time. She appears well-developed and well-nourished. No distress.  HENT:  Head: Normocephalic and atraumatic.  Cardiovascular: Normal rate, regular rhythm, normal heart sounds and intact distal pulses.   Pulmonary/Chest: Effort normal and breath sounds normal.  Genitourinary: Uterus normal. Rectal exam shows guaiac negative stool. Pelvic exam was performed with patient prone. There is no rash, tenderness or lesion on the right labia. There is no rash, tenderness or lesion on the left labia. Cervix exhibits no motion tenderness, no discharge and no friability. Vaginal discharge (thin white) found.  Musculoskeletal: She exhibits no edema.  Lymphadenopathy:       Right: No  inguinal adenopathy present.       Left: No inguinal adenopathy present.  Neurological: She is alert and oriented to person, place, and time.  Skin: Skin is warm and dry. No rash noted.  Psychiatric: She has a normal mood and affect.    Assessment & Plan:  Calisa was seen today for hypertension and gynecologic exam.  Diagnoses and all orders for this visit:  Pap smear for cervical cancer screening -     Cytology - PAP -     Hemoccult - 1 Card (office)  Essential  hypertension -     Discontinue: losartan (COZAAR) 100 MG tablet; Take 1 tablet (100 mg total) by mouth daily. -     amLODipine (NORVASC) 10 MG tablet; Take 1 tablet (10 mg total) by mouth daily. -     losartan-hydrochlorothiazide (HYZAAR) 50-12.5 MG tablet; Take 1 tablet by mouth daily.   There are no diagnoses linked to this encounter.  No orders of the defined types were placed in this encounter.   Follow-up: Return in about 4 weeks (around 12/26/2016) for BP check.   Boykin Nearing MD

## 2016-11-28 NOTE — Assessment & Plan Note (Signed)
Cessation addressed  

## 2016-11-28 NOTE — Patient Instructions (Addendum)
  Lori Bush was seen today for hypertension.  Diagnoses and all orders for this visit:  Pap smear for cervical cancer screening -     Cytology - PAP -     Hemoccult - 1 Card (office)  Essential hypertension -     Discontinue: losartan (COZAAR) 100 MG tablet; Take 1 tablet (100 mg total) by mouth daily. -     amLODipine (NORVASC) 10 MG tablet; Take 1 tablet (10 mg total) by mouth daily. -     losartan-hydrochlorothiazide (HYZAAR) 50-12.5 MG tablet; Take 1 tablet by mouth daily.   Change from losartan 50 mg daily To hyzaar 50-12.5 mg, losartan HCTZ combo  For hep [facility] [address] [place], [place]  [address] [phone]  You saw Dr. Linus Salmons on 05/27/2016   You will be called with results of pap smear Stool card is negative for blood today  For mammogram,pPlease call Rolena Infante, [phone],  with the BCCCP (breast and cervical cancer control program) at the [facility] to set up an appointment to verify eligibility for a breast exam, mammogram, ultrasound. If you qualify this will be set up at [facility].  Smoking cessation support: smoking cessation hotline: 1-800-QUIT-NOW.  Smoking cessation classes are available through [facility]. Call [phone] or visit our website at [email or web address]  F/u in 4 weeks for pharmacy BP check  F/u in 3 months for HTN  Dr. Adrian Blackwater

## 2016-11-28 NOTE — Assessment & Plan Note (Signed)
A: chronic HTN Med: compliant P: Continue norvasc 10 mg daily Change losartan 50 mg daily to hyzaar 50-12.5 mg daily At f/u increase to hyzaar 100-25 mg daily if BP > 130/80 Smoking cessation counseling provided Advised 150 of exercise (walking) per week

## 2016-11-29 LAB — CYTOLOGY - PAP
CHLAMYDIA, DNA PROBE: NEGATIVE
DIAGNOSIS: NEGATIVE
HPV (WINDOPATH): NOT DETECTED
NEISSERIA GONORRHEA: NEGATIVE

## 2016-11-29 LAB — CERVICOVAGINAL ANCILLARY ONLY: Wet Prep (BD Affirm): POSITIVE — AB

## 2016-12-04 ENCOUNTER — Telehealth: Payer: Self-pay

## 2016-12-04 DIAGNOSIS — N760 Acute vaginitis: Principal | ICD-10-CM

## 2016-12-04 DIAGNOSIS — B9689 Other specified bacterial agents as the cause of diseases classified elsewhere: Secondary | ICD-10-CM

## 2016-12-04 NOTE — Telephone Encounter (Signed)
Pt was called and informed of lab results. Pt states that she would like to take the medication ina pill format.

## 2016-12-05 ENCOUNTER — Ambulatory Visit: Payer: Self-pay | Admitting: Internal Medicine

## 2016-12-05 MED ORDER — FLUCONAZOLE 150 MG PO TABS: 150.0000 mg | ORAL_TABLET | Freq: Once | ORAL | 0 refills | Status: AC

## 2016-12-05 MED ORDER — METRONIDAZOLE 500 MG PO TABS: 500.0000 mg | ORAL_TABLET | Freq: Two times a day (BID) | ORAL | 0 refills | Status: DC

## 2016-12-05 MED FILL — metroNIDAZOLE 500 MG TABS: 500 | 7 days supply | Qty: 14 | Fill #0

## 2016-12-05 MED FILL — FLUCONAZOLE 150 MG TABLET: 150 | 1 days supply | Qty: 1 | Fill #0

## 2016-12-05 NOTE — Addendum Note (Signed)
Addended by: Boykin Nearing on: 12/05/2016 12:52 PM   Modules accepted: Orders

## 2016-12-05 NOTE — Telephone Encounter (Signed)
Please inform patient  BV treatment ordered   flagyl 500 mg twice daily followed by difucan 150 mg once to prevent yeast.  I have sent these to your pharmacy Do not mix flagyl with alcohol as this will cause stomach upset

## 2016-12-23 ENCOUNTER — Ambulatory Visit (INDEPENDENT_AMBULATORY_CARE_PROVIDER_SITE_OTHER): Payer: No Typology Code available for payment source | Admitting: Internal Medicine

## 2016-12-23 ENCOUNTER — Encounter: Payer: Self-pay | Admitting: Internal Medicine

## 2016-12-23 VITALS — BP 202/116 | HR 72 | Temp 98.3°F | Ht 60.0 in | Wt 112.0 lb

## 2016-12-23 DIAGNOSIS — I10 Essential (primary) hypertension: Secondary | ICD-10-CM

## 2016-12-23 DIAGNOSIS — Z23 Encounter for immunization: Secondary | ICD-10-CM

## 2016-12-23 DIAGNOSIS — B182 Chronic viral hepatitis C: Secondary | ICD-10-CM | POA: Diagnosis not present

## 2016-12-23 LAB — CBC WITH DIFFERENTIAL/PLATELET
BASOS PCT: 1 %
Basophils Absolute: 79 cells/uL (ref 0–200)
EOS PCT: 8 %
Eosinophils Absolute: 632 cells/uL — ABNORMAL HIGH (ref 15–500)
HCT: 44.5 % (ref 35.0–45.0)
Hemoglobin: 15.2 g/dL (ref 11.7–15.5)
LYMPHS PCT: 36 %
Lymphs Abs: 2844 cells/uL (ref 850–3900)
MCH: 29.5 pg (ref 27.0–33.0)
MCHC: 34.2 g/dL (ref 32.0–36.0)
MCV: 86.4 fL (ref 80.0–100.0)
MONO ABS: 474 {cells}/uL (ref 200–950)
MONOS PCT: 6 %
MPV: 10.3 fL (ref 7.5–12.5)
NEUTROS PCT: 49 %
Neutro Abs: 3871 cells/uL (ref 1500–7800)
Platelets: 269 10*3/uL (ref 140–400)
RBC: 5.15 MIL/uL — AB (ref 3.80–5.10)
RDW: 13.5 % (ref 11.0–15.0)
WBC: 7.9 10*3/uL (ref 3.8–10.8)

## 2016-12-23 NOTE — Assessment & Plan Note (Signed)
I will check her labs today and ultrasound with elastography.

## 2016-12-23 NOTE — Assessment & Plan Note (Addendum)
I encouraged her to stop smoking and call her PCP for recheck of her BP No headache or dizziness

## 2016-12-23 NOTE — Progress Notes (Signed)
   Subjective:    Patient ID: Lori Bush, female    DOB: Dec 04, 1960, 56 y.o.   MRN: [id number]  HPI Here for follow up of HCV. I saw her in November as a new patient but she at the time did not have insurance. She was also denied the [employer] so did not return for labs. She comes back now with insurance and ready for treatment.  No associated fatigue, no weight loss.  This is an established problem and has worsened by continuing to affect her liver with constant viremia.     Review of Systems  Constitutional: Negative for appetite change and fatigue.  Gastrointestinal: Negative for diarrhea.  Skin: Negative for rash.       Objective:   Physical Exam  Constitutional: She appears well-developed and well-nourished. No distress.  Eyes: No scleral icterus.  Cardiovascular: Normal rate, regular rhythm and normal heart sounds.   No murmur heard. Skin: No rash noted.   SH: + tobacco      Assessment & Plan:

## 2016-12-23 NOTE — Patient Instructions (Signed)
Date 12/23/16  Dear Ms Carrie Mew, As discussed in the [facility], your hepatitis C therapy will include highly effective medication(s) for treatment and will vary based on the type of hepatitis C and insurance approval.  Potential medications include:          Harvoni (sofosbuvir 90mg /ledipasvir 400mg ) tablet oral daily          OR     Epclusa (sofosbuvir 400mg /velpatasvir 100mg ) tablet oral daily          OR      Mavyret (glecaprevir 100 mg/pibrentasvir 40 mg): Take 3 tablets oral daily                       Medications are typically for 8 or 12 weeks total ---------------------------------------------------------------- Your HCV Treatment Start Date: You will be notified by our office once the medication is approved and where you can pick it up (or if mailed)   ---------------------------------------------------------------- [place]:   [facility] [place], [place] [address] Phone: [phone] Hours: Monday to Friday 7:30 am to 6:00 pm   Please always contact your pharmacy at least 3-4 business days before you run out of medications to ensure your next month's medication is ready or 1 week prior to running out if you receive it by mail.  Remember, each prescription is for 28 days. ---------------------------------------------------------------- GENERAL NOTES REGARDING YOUR HEPATITIS C MEDICATION:  Some medications have the following interactions:  - Acid reducing agents such as H2 blockers (ie. Pepcid (famotidine), Zantac (ranitidine), Tagamet (cimetidine), Axid (nizatidine) and proton pump inhibitors (ie. Prilosec (omeprazole), Protonix (pantoprazole), Nexium (esomeprazole), or Aciphex (rabeprazole)). Do not take until you have discussed with a health care provider.    -Antacids that contain magnesium and/or aluminum hydroxide (ie. Milk of Magensia, Rolaids, Gaviscon, Maalox, Mylanta, an dArthritis Pain Formula).  -Calcium carbonate  (calcium supplements or antacids such as Tums, Caltrate, Os-Cal).  -St. John's wort or any products that contain St. John's wort like some herbal supplements  Please inform the office prior to starting any of these medications.  - The common side effects associated with Harvoni include:      1. Fatigue      2. Headache      3. Nausea      4. Diarrhea      5. Insomnia  Please note that this only lists the most common side effects and is NOT a comprehensive list of the potential side effects of these medications. For more information, please review the drug information sheets that come with your medication package from the pharmacy.  ---------------------------------------------------------------- GENERAL HELPFUL HINTS ON HCV THERAPY: 1. Stay well-hydrated. 2. Notify the [facility] of any changes in your other over-the-counter/herbal or prescription medications. 3. If you miss a dose of your medication, take the missed dose as soon as you remember. Return to your regular time/dose schedule the next day.  4.  Do not stop taking your medications without first talking with your healthcare provider. 5.  You may take Tylenol (acetaminophen), as long as the dose is less than 2000 mg (OR no more than 4 tablets of the Tylenol Extra Strengths 500mg  tablet) in 24 hours. 6.  You will see our pharmacist-specialist within the first 2 weeks of starting your medication to monitor for any possible side effects. 7.  You will have labs once during treatment, soon after treatment completion and one final lab 6 months after treatment completion to verify the  virus is out of your system.  Scharlene Gloss, [place] [facility] [place], [place]  [id number] [phone]

## 2016-12-23 NOTE — Addendum Note (Signed)
Addended by: [name] A on: 12/23/2016 02:18 PM   Modules accepted: Orders

## 2016-12-24 LAB — HEPATITIS B SURFACE ANTIGEN: HEP B S AG: NEGATIVE

## 2016-12-24 LAB — COMPLETE METABOLIC PANEL WITH GFR
ALBUMIN: 4.1 g/dL (ref 3.6–5.1)
ALK PHOS: 70 U/L (ref 33–130)
ALT: 39 U/L — AB (ref 6–29)
AST: 28 U/L (ref 10–35)
BUN: 12 mg/dL (ref 7–25)
CALCIUM: 10.1 mg/dL (ref 8.6–10.4)
CHLORIDE: 107 mmol/L (ref 98–110)
CO2: 25 mmol/L (ref 20–31)
CREATININE: 0.82 mg/dL (ref 0.50–1.05)
GFR, Est Non African American: 81 mL/min (ref >=60)
Glucose, Bld: 86 mg/dL (ref 65–99)
Potassium: 3.6 mmol/L (ref 3.5–5.3)
Sodium: 141 mmol/L (ref 135–146)
Total Bilirubin: 0.5 mg/dL (ref 0.2–1.2)
Total Protein: 6.5 g/dL (ref 6.1–8.1)

## 2016-12-24 LAB — PROTIME-INR
INR: 1
PROTHROMBIN TIME: 10.6 s (ref 9.0–11.5)

## 2016-12-24 LAB — HEPATITIS B CORE ANTIBODY, TOTAL: Hep B Core Total Ab: NONREACTIVE

## 2016-12-24 LAB — HIV ANTIBODY (ROUTINE TESTING W REFLEX): HIV 1&2 Ab, 4th Generation: NONREACTIVE

## 2016-12-24 LAB — HEPATITIS B SURFACE ANTIBODY,QUALITATIVE: HEP B S AB: NEGATIVE

## 2016-12-24 LAB — HEPATITIS A ANTIBODY, TOTAL: HEP A TOTAL AB: NONREACTIVE

## 2016-12-26 ENCOUNTER — Telehealth: Payer: Self-pay | Admitting: *Deleted

## 2016-12-26 LAB — LIVER FIBROSIS, FIBROTEST-ACTITEST
ALT: 39 U/L — ABNORMAL HIGH (ref 6–29)
Alpha-2-Macroglobulin: 413 mg/dL — ABNORMAL HIGH (ref 106–279)
Apolipoprotein A1: 185 mg/dL (ref 101–198)
BILIRUBIN: 0.4 mg/dL (ref 0.2–1.2)
Fibrosis Score: 0.37
GGT: 35 U/L (ref 3–70)
Haptoglobin: 124 mg/dL (ref 43–212)
NECROINFLAMMAT ACT SCORE: 0.23
Reference ID: 1997504

## 2016-12-26 NOTE — Telephone Encounter (Signed)
Patient notified of appt for ultrasound on 01/19/17 at 8:45 AM at [facility]. Nothing to eat or drink after midnight. [name]

## 2016-12-27 ENCOUNTER — Other Ambulatory Visit: Payer: Self-pay | Admitting: Internal Medicine

## 2016-12-27 LAB — HCV RNA, QN PCR RFLX GENO, LIPA
HCV RNA, PCR, QN: 1100000 IU/mL — ABNORMAL HIGH
HCV RNA, PCR, QN: 6.04 log IU/mL — ABNORMAL HIGH

## 2016-12-27 LAB — HEPATITIS C GENOTYPE

## 2016-12-27 MED ORDER — LEDIPASVIR-SOFOSBUVIR 90-400 MG PO TABS: 1.0000 | ORAL_TABLET | Freq: Every day | ORAL | 2 refills | Status: DC

## 2017-01-01 ENCOUNTER — Ambulatory Visit: Payer: No Typology Code available for payment source | Attending: Family Medicine | Admitting: Pharmacist

## 2017-01-01 VITALS — BP 164/109 | HR 66

## 2017-01-01 DIAGNOSIS — I10 Essential (primary) hypertension: Secondary | ICD-10-CM | POA: Diagnosis not present

## 2017-01-01 DIAGNOSIS — Z79899 Other long term (current) drug therapy: Secondary | ICD-10-CM | POA: Diagnosis not present

## 2017-01-01 MED ORDER — LOSARTAN POTASSIUM-HCTZ 100-25 MG PO TABS: 1.0000 | ORAL_TABLET | Freq: Every day | ORAL | 2 refills | Status: DC

## 2017-01-01 NOTE — Patient Instructions (Addendum)
Thanks for coming to see [place]  Increase losartan-hydrochlorothiazide to 100-25 mg daily.  Come back in 2 weeks   DASH Eating Plan DASH stands for "Dietary Approaches to Stop Hypertension." The DASH eating plan is a healthy eating plan that has been shown to reduce high blood pressure (hypertension). It may also reduce your risk for type 2 diabetes, heart disease, and stroke. The DASH eating plan may also help with weight loss. What are tips for following this plan? General guidelines  Avoid eating more than 2,300 mg (milligrams) of salt (sodium) a day. If you have hypertension, you may need to reduce your sodium intake to 1,500 mg a day.  Limit alcohol intake to no more than 1 drink a day for nonpregnant women and 2 drinks a day for men. One drink equals 12 oz of beer, 5 oz of wine, or 1 oz of hard liquor.  Work with your health care provider to maintain a healthy body weight or to lose weight. Ask what an ideal weight is for you.  Get at least 30 minutes of exercise that causes your heart to beat faster (aerobic exercise) most days of the week. Activities may include walking, swimming, or biking.  Work with your health care provider or diet and nutrition specialist (dietitian) to adjust your eating plan to your individual calorie needs. Reading food labels  Check food labels for the amount of sodium per serving. Choose foods with less than 5 percent of the Daily Value of sodium. Generally, foods with less than 300 mg of sodium per serving fit into this eating plan.  To find whole grains, look for the word "whole" as the first word in the ingredient list. Shopping  Buy products labeled as "low-sodium" or "no salt added."  Buy fresh foods. Avoid canned foods and premade or frozen meals. Cooking  Avoid adding salt when cooking. Use salt-free seasonings or herbs instead of table salt or sea salt. Check with your health care provider or pharmacist before using salt substitutes.  Do not  fry foods. Cook foods using healthy methods such as baking, boiling, grilling, and broiling instead.  Cook with heart-healthy oils, such as olive, canola, soybean, or sunflower oil. Meal planning   Eat a balanced diet that includes: ? 5 or more servings of fruits and vegetables each day. At each meal, try to fill half of your plate with fruits and vegetables. ? Up to 6-8 servings of whole grains each day. ? Less than 6 oz of lean meat, poultry, or fish each day. A 3-oz serving of meat is about the same size as a deck of cards. One egg equals 1 oz. ? 2 servings of low-fat dairy each day. ? A serving of nuts, seeds, or beans 5 times each week. ? Heart-healthy fats. Healthy fats called Omega-3 fatty acids are found in foods such as flaxseeds and coldwater fish, like sardines, salmon, and mackerel.  Limit how much you eat of the following: ? Canned or prepackaged foods. ? Food that is high in trans fat, such as fried foods. ? Food that is high in saturated fat, such as fatty meat. ? Sweets, desserts, sugary drinks, and other foods with added sugar. ? Full-fat dairy products.  Do not salt foods before eating.  Try to eat at least 2 vegetarian meals each week.  Eat more home-cooked food and less restaurant, buffet, and fast food.  When eating at a restaurant, ask that your food be prepared with less salt or no salt,  if possible. What foods are recommended? The items listed may not be a complete list. Talk with your dietitian about what dietary choices are best for you. Grains Whole-grain or whole-wheat bread. Whole-grain or whole-wheat pasta. Brown rice. [place]. Bulgur. Whole-grain and low-sodium cereals. Pita bread. Low-fat, low-sodium crackers. Whole-wheat flour tortillas. Vegetables Fresh or frozen vegetables (raw, steamed, roasted, or grilled). Low-sodium or reduced-sodium tomato and vegetable juice. Low-sodium or reduced-sodium tomato sauce and tomato paste. Low-sodium or  reduced-sodium canned vegetables. Fruits All fresh, dried, or frozen fruit. Canned fruit in natural juice (without added sugar). Meat and other protein foods Skinless chicken or [place]. Ground chicken or [place]. Pork with fat trimmed off. Fish and seafood. Egg whites. Dried beans, peas, or lentils. Unsalted nuts, nut butters, and seeds. Unsalted canned beans. Lean cuts of beef with fat trimmed off. Low-sodium, lean deli meat. Dairy Low-fat (1%) or fat-free (skim) milk. Fat-free, low-fat, or reduced-fat cheeses. Nonfat, low-sodium ricotta or cottage cheese. Low-fat or nonfat yogurt. Low-fat, low-sodium cheese. Fats and oils Soft margarine without trans fats. Vegetable oil. Low-fat, reduced-fat, or light mayonnaise and salad dressings (reduced-sodium). Canola, safflower, olive, soybean, and sunflower oils. Avocado. Seasoning and other foods Herbs. Spices. Seasoning mixes without salt. Unsalted popcorn and pretzels. Fat-free sweets. What foods are not recommended? The items listed may not be a complete list. Talk with your dietitian about what dietary choices are best for you. Grains Baked goods made with fat, such as croissants, muffins, or some breads. Dry pasta or rice meal packs. Vegetables Creamed or fried vegetables. Vegetables in a cheese sauce. Regular canned vegetables (not low-sodium or reduced-sodium). Regular canned tomato sauce and paste (not low-sodium or reduced-sodium). Regular tomato and vegetable juice (not low-sodium or reduced-sodium). Angie Fava. Olives. Fruits Canned fruit in a light or heavy syrup. Fried fruit. Fruit in cream or butter sauce. Meat and other protein foods Fatty cuts of meat. Ribs. Fried meat. Berniece Salines. Sausage. Bologna and other processed lunch meats. Salami. Fatback. Hotdogs. Bratwurst. Salted nuts and seeds. Canned beans with added salt. Canned or smoked fish. Whole eggs or egg yolks. Chicken or [place] with skin. Dairy Whole or 2% milk, cream, and half-and-half.  Whole or full-fat cream cheese. Whole-fat or sweetened yogurt. Full-fat cheese. Nondairy creamers. Whipped toppings. Processed cheese and cheese spreads. Fats and oils Butter. Stick margarine. Lard. Shortening. Ghee. Bacon fat. Tropical oils, such as coconut, palm kernel, or palm oil. Seasoning and other foods Salted popcorn and pretzels. Onion salt, garlic salt, seasoned salt, table salt, and sea salt. Worcestershire sauce. Tartar sauce. Barbecue sauce. Teriyaki sauce. Soy sauce, including reduced-sodium. Steak sauce. Canned and packaged gravies. Fish sauce. Oyster sauce. Cocktail sauce. Horseradish that you find on the shelf. Ketchup. Mustard. Meat flavorings and tenderizers. Bouillon cubes. Hot sauce and Tabasco sauce. Premade or packaged marinades. Premade or packaged taco seasonings. Relishes. Regular salad dressings. Where to find more information:  National Heart, Lung, and [place]: https://wilson-eaton.com/  American Heart Association: www.heart.org Summary  The DASH eating plan is a healthy eating plan that has been shown to reduce high blood pressure (hypertension). It may also reduce your risk for type 2 diabetes, heart disease, and stroke.  With the DASH eating plan, you should limit salt (sodium) intake to 2,300 mg a day. If you have hypertension, you may need to reduce your sodium intake to 1,500 mg a day.  When on the DASH eating plan, aim to eat more fresh fruits and vegetables, whole grains, lean proteins, low-fat dairy, and heart-healthy fats.  Work with your health care provider or diet and nutrition specialist (dietitian) to adjust your eating plan to your individual calorie needs. This information is not intended to replace advice given to you by your health care provider. Make sure you discuss any questions you have with your health care provider. Document Released: 06/13/2011 Document Revised: 06/17/2016 Document Reviewed: 06/17/2016 Elsevier Interactive Patient Education   2017 [employer].

## 2017-01-01 NOTE — Progress Notes (Signed)
   S:    Patient arrives in good spirits. Presents to the clinic for hypertension evaluation. Patient was referred on 11/28/16.  Patient was last seen by Primary Care Provider on 11/28/16.   Patient reports adherence with medications. She reports that she took her medications this morning.  She is trying to adhere to a low salt diet.   Current BP Medications include:  Losartan-HCTZ 50-12.5 mg daily, amlodipine 10 mg daily  Patient denies chest pain, N/V/D, headache, weakness, dizziness, vision changes.  O:   Last 3 Office BP readings: BP Readings from Last 3 Encounters:  01/01/17 (!) 164/109  12/23/16 (!) 202/116  11/28/16 (!) 163/92    BMET    Component Value Date/Time   NA 141 12/23/2016 1413   K 3.6 12/23/2016 1413   CL 107 12/23/2016 1413   CO2 25 12/23/2016 1413   GLUCOSE 86 12/23/2016 1413   BUN 12 12/23/2016 1413   CREATININE 0.82 12/23/2016 1413   CALCIUM 10.1 12/23/2016 1413   GFRNONAA 81 12/23/2016 1413   GFRAA >89 12/23/2016 1413    A/P: Hypertension longstanding currently uncontrolled on current medications.  Increased dose of losartan-HCTZ 100-25 mg daily. Continued amlodipine 10 mg daily. Provided information on DASH diet.  Results reviewed and written information provided.   Total time in face-to-face counseling 15 minutes.   F/U Clinic Visit with me in 2 weeks.  Patient seen with Maple Mirza, PharmD Candidate and Mechele Claude, PharmD Candidate

## 2017-01-09 ENCOUNTER — Ambulatory Visit (HOSPITAL_COMMUNITY)
Admission: RE | Admit: 2017-01-09 | Discharge: 2017-01-09 | Disposition: A | Discharge status: Home / Self Care | Payer: No Typology Code available for payment source | Source: Ambulatory Visit | Attending: Internal Medicine | Admitting: Internal Medicine

## 2017-01-09 DIAGNOSIS — K8020 Calculus of gallbladder without cholecystitis without obstruction: Secondary | ICD-10-CM | POA: Diagnosis not present

## 2017-01-09 DIAGNOSIS — B182 Chronic viral hepatitis C: Secondary | ICD-10-CM

## 2017-03-17 ENCOUNTER — Encounter: Payer: Self-pay | Admitting: Internal Medicine

## 2017-03-17 ENCOUNTER — Ambulatory Visit: Payer: No Typology Code available for payment source | Attending: Internal Medicine | Admitting: Internal Medicine

## 2017-03-17 VITALS — BP 164/74 | HR 75 | Temp 98.3°F | Resp 18 | Ht 60.0 in | Wt 109.6 lb

## 2017-03-17 DIAGNOSIS — M1711 Unilateral primary osteoarthritis, right knee: Secondary | ICD-10-CM | POA: Diagnosis not present

## 2017-03-17 DIAGNOSIS — Z79899 Other long term (current) drug therapy: Secondary | ICD-10-CM | POA: Diagnosis not present

## 2017-03-17 DIAGNOSIS — I10 Essential (primary) hypertension: Secondary | ICD-10-CM | POA: Diagnosis present

## 2017-03-17 DIAGNOSIS — L729 Follicular cyst of the skin and subcutaneous tissue, unspecified: Secondary | ICD-10-CM | POA: Diagnosis not present

## 2017-03-17 DIAGNOSIS — F17200 Nicotine dependence, unspecified, uncomplicated: Secondary | ICD-10-CM | POA: Insufficient documentation

## 2017-03-17 DIAGNOSIS — Z9889 Other specified postprocedural states: Secondary | ICD-10-CM | POA: Diagnosis not present

## 2017-03-17 DIAGNOSIS — Z1211 Encounter for screening for malignant neoplasm of colon: Secondary | ICD-10-CM

## 2017-03-17 DIAGNOSIS — Z1231 Encounter for screening mammogram for malignant neoplasm of breast: Secondary | ICD-10-CM

## 2017-03-17 DIAGNOSIS — Z1239 Encounter for other screening for malignant neoplasm of breast: Secondary | ICD-10-CM

## 2017-03-17 DIAGNOSIS — Z8249 Family history of ischemic heart disease and other diseases of the circulatory system: Secondary | ICD-10-CM | POA: Diagnosis not present

## 2017-03-17 DIAGNOSIS — F17210 Nicotine dependence, cigarettes, uncomplicated: Secondary | ICD-10-CM | POA: Insufficient documentation

## 2017-03-17 DIAGNOSIS — Z2821 Immunization not carried out because of patient refusal: Secondary | ICD-10-CM

## 2017-03-17 DIAGNOSIS — B182 Chronic viral hepatitis C: Secondary | ICD-10-CM | POA: Diagnosis not present

## 2017-03-17 MED ORDER — CARVEDILOL 3.125 MG PO TABS: 3.1250 mg | ORAL_TABLET | Freq: Two times a day (BID) | ORAL | 3 refills | Status: DC

## 2017-03-17 NOTE — Progress Notes (Signed)
Patient ID: Lori Bush, female    DOB: 01-30-1961  MRN: [id number]  CC: No chief complaint on file.   Subjective: Lori Bush is a 56 y.o. female who presents for chronic ds management.  Last saw Dr. Adrian Blackwater 11/2016 Her concerns today include:  Hx of HTN, hep C on Harvoni through ID Dr. Linus Salmons, tobacco dep.  1. Hep C -just got Harvoni and will start taking today  2. HTN -"I don't like this medicine." Thinks she did better with Lisinopril stop d/c due to cough -compliant with Norvasc and Losartan HCTZ. -limits salt -no CP/SOB/HA/LE edema Fhx of HTN -walks on treadmill at her apartment complex 3 days a wk Eating habits can be better.  Tends to skip breakfast  Tob dep: -had quit for 5-6 yrs cold [place].  "Somethings happen and I started back for comfort.  Quitting this time has been more difficult." -stress of job contributes but smoking less. Smoking 5-6 cig/day.  She was at a pk a day in past.  soft mass present RT inguinal area  for several yrs. Recent incr size. Not bothersome Previous PCP told her likely a fatty tumor and to observe. Patient Active Problem List   Diagnosis Date Noted  . Primary osteoarthritis of right knee 10/28/2016  . Chronic hepatitis C without hepatic coma ([place]) 03/26/2016  . Chronic right shoulder pain 01/17/2015  . Moderate cigarette smoker (10-19 per day) 04/14/2014  . Hypertension 03/05/2013     Current Outpatient Prescriptions on File Prior to Visit  Medication Sig Dispense Refill  . albuterol (PROVENTIL HFA;VENTOLIN HFA) 108 (90 Base) MCG/ACT inhaler Inhale 2 puffs into the lungs every 4 (four) hours as needed for wheezing or shortness of breath. 1 Inhaler 0  . amLODipine (NORVASC) 10 MG tablet Take 1 tablet (10 mg total) by mouth daily. 90 tablet 3  . cetirizine (ZYRTEC) 10 MG tablet Take 1 tablet (10 mg total) by mouth daily. 30 tablet 11  . fluticasone (FLONASE) 50 MCG/ACT nasal spray Place 2 sprays into both nostrils at  bedtime. 16 g 2  . Ledipasvir-Sofosbuvir (HARVONI) 90-400 MG TABS Take 1 tablet by mouth daily. 28 tablet 2  . losartan-hydrochlorothiazide (HYZAAR) 100-25 MG tablet Take 1 tablet by mouth daily. 30 tablet 2   No current facility-administered medications on file prior to visit.     No Known Allergies  Social History   Social History  . Marital status: Single    Spouse name: N/A  . Number of children: N/A  . Years of education: N/A   Occupational History  . Not on file.   Social History Main Topics  . Smoking status: Current Every Day Smoker    Packs/day: 0.50    Years: 31.00    Types: Cigarettes  . Smokeless tobacco: Never Used     Comment: states she cut back 3 cigarettes/day  . Alcohol use No  . Drug use: No  . Sexual activity: Not on file   Other Topics Concern  . Not on file   Social History Narrative  . No narrative on file    Family History  Problem Relation Age of Onset  . Hypertension Mother   . Heart disease Mother   . Hypertension Father   . Dementia Father   . Arthritis Sister   . Hypertension Sister   . Arthritis Brother   . Hypertension Brother     Past Surgical History:  Procedure Laterality Date  . CESAREAN SECTION    . HERNIA  REPAIR     as a child  . WISDOM TOOTH EXTRACTION      ROS: Review of Systems Negative except as stated above PHYSICAL EXAM: BP (!) 174/97 (BP Location: Left Arm, Patient Position: Sitting, Cuff Size: Normal)   Pulse 75   Temp 98.3 F (36.8 C) (Oral)   Resp 18   Ht 5' (1.524 m)   Wt 109 lb 9.6 oz (49.7 kg)   SpO2 99%   BMI 21.40 kg/m   164/74 Wt Readings from Last 3 Encounters:  03/17/17 109 lb 9.6 oz (49.7 kg)  12/23/16 112 lb (50.8 kg)  11/28/16 118 lb (53.5 kg)   Physical Exam General appearance - alert, well appearing, middle-age African-American female in no distress Mental status - alert, oriented to person, place, and time, normal mood, behavior, speech, dress, motor activity, and thought  processes Mouth - mucous membranes moist, pharynx normal without lesions Neck - supple, no significant adenopathy Chest - clear to auscultation, no wheezes, rales or rhonchi, symmetric air entry Heart - normal rate, regular rhythm, normal S1, S2, no murmurs, rubs, clicks or gallops Extremities - peripheral pulses normal, no pedal edema, no clubbing or cyanosis Skin: 2x2 cm soft, non-tender, movable mass    ASSESSMENT AND PLAN: 1. Essential hypertension Not at goal. Add low-dose of carvedilol. Advise patient to check blood pressure 2-3 times a week with goal being 140/90 or lower And pulse being 60 or higher. Patient informed that medication can cause a decrease in pulse rate - carvedilol (COREG) 3.125 MG tablet; Take 1 tablet (3.125 mg total) by mouth 2 (two) times daily with a meal.  Dispense: 60 tablet; Refill: 3  2. Hep C w/o coma, chronic (HCC) Followed by ID. Chest starting on Harvoni  3. Influenza vaccination declined  4. Colon cancer screening - Ambulatory referral to Gastroenterology  5. Breast cancer screening - MM Digital Screening; Future  6. Cyst of skin -We will observe for now.  7.  Tobacco dependence Patient advised to quit smoking. Discussed health risks associated with smoking including lung and other types of cancers, chronic lung diseases and CV risks.. Pt not ready to give trail of quitting but she has cut back About 3 minutes spent with counseling   Patient was given the opportunity to ask questions.  Patient verbalized understanding of the plan and was able to repeat key elements of the plan.   No orders of the defined types were placed in this encounter.    Requested Prescriptions    No prescriptions requested or ordered in this encounter    No Follow-up on file.  Karle Plumber, MD, FACP

## 2017-03-17 NOTE — Patient Instructions (Signed)
Check your blood pressure 2-3 times a week. Goal is blood pressure of 140/90 or lower.   Continue Norvasc and Losartan/HCTZ.  Start Coreg 3.125 mg twice a day.

## 2017-04-01 ENCOUNTER — Other Ambulatory Visit: Payer: Self-pay

## 2017-04-01 DIAGNOSIS — R053 Chronic cough: Secondary | ICD-10-CM

## 2017-04-01 DIAGNOSIS — R05 Cough: Secondary | ICD-10-CM

## 2017-04-01 MED ORDER — ALBUTEROL SULFATE HFA 108 (90 BASE) MCG/ACT IN AERS: 2.0000 | INHALATION_SPRAY | RESPIRATORY_TRACT | 2 refills | Status: DC | PRN

## 2017-04-01 MED ORDER — FLUTICASONE PROPIONATE 50 MCG/ACT NA SUSP: 2.0000 | Freq: Every day | NASAL | 2 refills | Status: DC

## 2017-04-01 MED FILL — FLUTICASONE PROP 50 MCG SPR: 50 | 30 days supply | Qty: 16 | Fill #0

## 2017-04-01 MED FILL — LOSARTAN-HCTZ 100-25 MG TAB: 100-25 | 30 days supply | Qty: 30 | Fill #0

## 2017-04-01 MED FILL — AMLODIPINE BESYLATE 10 MG T: 10 | 90 days supply | Qty: 90 | Fill #1

## 2017-04-01 MED FILL — !VENTOLIN HFA INHALER: 108 (90 BAS | 17 days supply | Qty: 18 | Fill #0

## 2017-04-01 MED FILL — ?CARVEDILOL 3.125 MG TABLET: 3.125 | 30 days supply | Qty: 60 | Fill #0

## 2017-04-03 ENCOUNTER — Encounter: Payer: Self-pay | Admitting: Pharmacy Technician

## 2017-04-09 ENCOUNTER — Ambulatory Visit (INDEPENDENT_AMBULATORY_CARE_PROVIDER_SITE_OTHER): Payer: Self-pay | Admitting: Pharmacist

## 2017-04-09 DIAGNOSIS — B182 Chronic viral hepatitis C: Secondary | ICD-10-CM

## 2017-04-09 DIAGNOSIS — R110 Nausea: Secondary | ICD-10-CM

## 2017-04-09 DIAGNOSIS — Z23 Encounter for immunization: Secondary | ICD-10-CM

## 2017-04-09 MED ORDER — ONDANSETRON 4 MG PO TBDP: 4.0000 mg | ORAL_TABLET | Freq: Three times a day (TID) | ORAL | 2 refills | Status: DC | PRN

## 2017-04-09 MED FILL — ONDANSETRON ODT 4 MG TABLET: 4 | 7 days supply | Qty: 20 | Fill #0

## 2017-04-09 NOTE — Progress Notes (Signed)
HPI: Ardis Lawley is a 56 y.o. female who presents to the [facility] today for HepC follow-up. She has genotype 1a, F1/F2, and started 12 weeks of Harvoni on 03/19/17.  Lab Results  Component Value Date   HCVGENOTYPE 1a 12/23/2016    Allergies: No Known Allergies  Vitals:    Past Medical History: Past Medical History:  Diagnosis Date  . Arthritis Dx 2012  . Hypertension Dx 2005    Social History: Social History   Social History  . Marital status: Single    Spouse name: N/A  . Number of children: N/A  . Years of education: N/A   Social History Main Topics  . Smoking status: Current Every Day Smoker    Packs/day: 0.50    Years: 31.00    Types: Cigarettes  . Smokeless tobacco: Never Used     Comment: states she cut back 3 cigarettes/day  . Alcohol use No  . Drug use: No  . Sexual activity: Not on file   Other Topics Concern  . Not on file   Social History Narrative  . No narrative on file    Labs: Hep B S Ab (no units)  Date Value  12/23/2016 NEG   Hepatitis B Surface Ag (no units)  Date Value  12/23/2016 NEGATIVE   HCV Ab (no units)  Date Value  03/22/2016 REACTIVE (A)    Lab Results  Component Value Date   HCVGENOTYPE 1a 12/23/2016    Hepatitis C RNA quantitative Latest Ref Rng & Units 03/22/2016  HCV Quantitative <15 IU/mL 1,132,745(H)  HCV Quantitative Log <1.18 log 10 6.05(H)    AST (U/L)  Date Value  12/23/2016 28  03/26/2014 36  02/10/2014 27   ALT (U/L)  Date Value  12/23/2016 39 (H)  12/23/2016 39 (H)  03/26/2014 31  02/10/2014 24   INR (no units)  Date Value  12/23/2016 1.0    CrCl: CrCl cannot be calculated (Patient's most recent lab result is older than the maximum 21 days allowed.).  Fibrosis Score: F1/F2 as assessed by ARFI   Assessment: Shaquala is here today for her first follow-up visit after starting [place] for hepC. She has been taking the medication for approximately 3 weeks. She reports  experiencing significant NVD during the first week of treatment, which she managed with OTC medications including Immodium and an anti-emetic, the name of which she could not recall (possibly an antihistamine?). The vomiting and diarrhea have since resolved but she is still experiencing some nausea. She has had an associated poor appetite and 7 pound weight loss since starting Harvoni. She is unable to eat certain foods, such as beef, and has been eating more liquid foods like soup and Ensure. Will give Rx for Zofran. Instructed to take 30 minutes before Harvoni and may repeat up to 3 times daily if needed. She also reports occasional HA, which she has been treating with prn IBU. She reports some vision changes, which she describes as a "constant glare" and blurry vision that worsens at nighttime. I do not think this is related to the Harvoni, and recommended that she get an eye exam, which she has not had for about a year.  Lanah denies any missed doses of Harvoni and did not report any issues receiving her first month of medication from [facility]. She has about 9 tablets left in her bottle and has not yet heard from them regarding a refill. She was instructed to call [facility] if she gets down  to 5-7 tablets and still has not received any communication from them.  Mckaylie was offered a flu shot and denied it, citing that it has made her very ill in the past. She was also offered hepatitis A and B vaccinations, which she accepted. She received the first dose of each series today and will return to the clinic to complete.   Recommendations: -Zofran 4mg  ODT q8h prn NV -HepC VL today -F/u appt on 11/6 @ 0930 for 2nd hepB shot -F/u appt on 12/18 @ 1000 for hepC EOT visit  Erin N. Gerarda Fraction, PharmD PGY1 Pharmacy Resident Pager: [phone]  04/09/2017, 11:03 AM

## 2017-04-10 ENCOUNTER — Ambulatory Visit: Payer: Self-pay | Attending: Internal Medicine | Admitting: *Deleted

## 2017-04-10 VITALS — BP 153/90 | HR 70

## 2017-04-10 DIAGNOSIS — Z0489 Encounter for examination and observation for other specified reasons: Secondary | ICD-10-CM | POA: Insufficient documentation

## 2017-04-10 DIAGNOSIS — I10 Essential (primary) hypertension: Secondary | ICD-10-CM | POA: Insufficient documentation

## 2017-04-10 NOTE — Progress Notes (Signed)
Pt arrived to [facility] to request blood pressure check. Last blood pressure while in office was 164/74.   Pt alert and oriented and arrives in good spirits, NAD Pt denies chest pain or SOB. Pt c/o  HA, dizziness and blurred vision for 2 -3  Days.   She feels that sx's are from  [facility] or vaccines: Hep A and B after administered on yesterday, 04/09/2017. She also had multiple emesis episode since Tuesday. Per pt. She states ID physician states symptoms should subside after being on Harvoni for  1 month.   Verified medication. Pt states medication was taken this morning. She was able to keep them down with water.   Blood pressure reading: 155/100 and 153/90 HR: 70  Will notify physician. Pt had to leave for work.

## 2017-04-12 LAB — HEPATITIS C RNA QUANTITATIVE
HCV Quantitative Log: <1.18 Log IU/mL
HCV RNA, PCR, QN: <15 IU/mL

## 2017-04-13 ENCOUNTER — Telehealth: Payer: Self-pay | Admitting: Internal Medicine

## 2017-04-13 DIAGNOSIS — I10 Essential (primary) hypertension: Secondary | ICD-10-CM

## 2017-04-13 MED ORDER — CARVEDILOL 6.25 MG PO TABS: 3.1250 mg | ORAL_TABLET | Freq: Two times a day (BID) | ORAL | 5 refills | Status: DC

## 2017-04-13 NOTE — Telephone Encounter (Signed)
RN Jeani Sow, note reviewed. BP not at goal. Will advise to increase Coreg to 6.25 mg BID.

## 2017-04-14 NOTE — Telephone Encounter (Signed)
Pt aware of message. She will take recommended dose of  Carvedilol.

## 2017-04-15 ENCOUNTER — Other Ambulatory Visit: Payer: Self-pay | Admitting: Internal Medicine

## 2017-04-15 DIAGNOSIS — Z1231 Encounter for screening mammogram for malignant neoplasm of breast: Secondary | ICD-10-CM

## 2017-04-29 ENCOUNTER — Ambulatory Visit
Admission: RE | Admit: 2017-04-29 | Discharge: 2017-04-29 | Disposition: A | Discharge status: Home / Self Care | Payer: No Typology Code available for payment source | Source: Ambulatory Visit | Attending: Internal Medicine | Admitting: Internal Medicine

## 2017-04-29 DIAGNOSIS — Z1231 Encounter for screening mammogram for malignant neoplasm of breast: Secondary | ICD-10-CM

## 2017-05-05 ENCOUNTER — Encounter (HOSPITAL_COMMUNITY): Payer: Self-pay

## 2017-05-05 ENCOUNTER — Emergency Department (HOSPITAL_COMMUNITY)
Admission: EM | Admit: 2017-05-05 | Discharge: 2017-05-06 | Disposition: A | Discharge status: Home / Self Care | Payer: No Typology Code available for payment source | Attending: Emergency Medicine | Admitting: Emergency Medicine

## 2017-05-05 DIAGNOSIS — S20211A Contusion of right front wall of thorax, initial encounter: Secondary | ICD-10-CM | POA: Insufficient documentation

## 2017-05-05 DIAGNOSIS — Y9389 Activity, other specified: Secondary | ICD-10-CM | POA: Insufficient documentation

## 2017-05-05 DIAGNOSIS — F17210 Nicotine dependence, cigarettes, uncomplicated: Secondary | ICD-10-CM | POA: Insufficient documentation

## 2017-05-05 DIAGNOSIS — W3189XA Contact with other specified machinery, initial encounter: Secondary | ICD-10-CM | POA: Insufficient documentation

## 2017-05-05 DIAGNOSIS — I10 Essential (primary) hypertension: Secondary | ICD-10-CM | POA: Insufficient documentation

## 2017-05-05 DIAGNOSIS — Y999 Unspecified external cause status: Secondary | ICD-10-CM | POA: Insufficient documentation

## 2017-05-05 DIAGNOSIS — Z79899 Other long term (current) drug therapy: Secondary | ICD-10-CM | POA: Insufficient documentation

## 2017-05-05 DIAGNOSIS — Y9289 Other specified places as the place of occurrence of the external cause: Secondary | ICD-10-CM | POA: Insufficient documentation

## 2017-05-05 NOTE — ED Provider Notes (Signed)
TIME SEEN: 11:58 PM  CHIEF COMPLAINT: Right rib pain  HPI: Patient is a 56 year old female with history of hypertension, hepatitis C who presents to the emergency department with right rib pain underneath her right breast after having a mammogram on October 23.  Mammogram was normal.  States she is having pain underneath the breast and that palpation to this area and wearing a tight bra has been uncomfortable.  States that it makes her feel short of breath because she has a hard time taking a deep breath.  No swelling, bruising to the breast.  No nipple discharge.  No fever.  Is not on blood thinners.  No history of PE or DVT.  No cough.  Pain worse with movement of the right arm.  ROS: See HPI Constitutional: no fever  Eyes: no drainage  ENT: no runny nose   Cardiovascular: Right-sided chest pain  Resp: no SOB  GI: no vomiting GU: no dysuria Integumentary: no rash  Allergy: no hives  Musculoskeletal: no leg swelling  Neurological: no slurred speech ROS otherwise negative  PAST MEDICAL HISTORY/PAST SURGICAL HISTORY:  Past Medical History:  Diagnosis Date  . Arthritis Dx 2012  . Hypertension Dx 2005    MEDICATIONS:  Prior to Admission medications   Medication Sig Start Date End Date Taking? Authorizing Provider  albuterol (PROVENTIL HFA;VENTOLIN HFA) 108 (90 Base) MCG/ACT inhaler Inhale 2 puffs into the lungs every 4 (four) hours as needed for wheezing or shortness of breath. 04/01/17  Yes Ladell Pier, MD  amLODipine (NORVASC) 10 MG tablet Take 1 tablet (10 mg total) by mouth daily. 11/28/16  Yes Funches, Adriana Mccallum, MD  carvedilol (COREG) 6.25 MG tablet Take 0.5 tablets (3.125 mg total) by mouth 2 (two) times daily with a meal. 04/13/17  Yes Ladell Pier, MD  cetirizine (ZYRTEC) 10 MG tablet Take 1 tablet (10 mg total) by mouth daily. Patient taking differently: Take 10 mg by mouth daily as needed for allergies.  08/01/15  Yes Funches, Josalyn, MD  fluticasone (FLONASE) 50  MCG/ACT nasal spray Place 2 sprays into both nostrils at bedtime. 04/01/17  Yes Ladell Pier, MD  Ledipasvir-Sofosbuvir (HARVONI) 90-400 MG TABS Take 1 tablet by mouth daily. 12/27/16  Yes Comer, Okey Regal, MD  losartan-hydrochlorothiazide (HYZAAR) 100-25 MG tablet Take 1 tablet by mouth daily. 01/01/17  Yes Tresa Garter, MD  ondansetron (ZOFRAN ODT) 4 MG disintegrating tablet Take 1 tablet (4 mg total) by mouth every 8 (eight) hours as needed for nausea or vomiting. 04/09/17  Yes Kuppelweiser, Cassie L, RPH-CPP    ALLERGIES:  No Known Allergies  SOCIAL HISTORY:  Social History  Substance Use Topics  . Smoking status: Current Every Day Smoker    Packs/day: 0.50    Years: 31.00    Types: Cigarettes  . Smokeless tobacco: Never Used     Comment: states she cut back 3 cigarettes/day  . Alcohol use No    FAMILY HISTORY: Family History  Problem Relation Age of Onset  . Hypertension Mother   . Heart disease Mother   . Hypertension Father   . Dementia Father   . Arthritis Sister   . Hypertension Sister   . Arthritis Brother   . Hypertension Brother     EXAM: BP (!) 161/102 (BP Location: Right Arm)   Pulse 79   Temp 97.6 F (36.4 C) (Oral)   Resp (!) 24   Ht 5' (1.524 m)   Wt 48.5 kg (107 lb)   SpO2  100%   BMI 20.90 kg/m  CONSTITUTIONAL: Alert and oriented and responds appropriately to questions. Well-appearing; well-nourished HEAD: Normocephalic EYES: Conjunctivae clear, pupils appear equal, EOMI ENT: normal nose; moist mucous membranes NECK: Supple, no meningismus, no nuchal rigidity, no LAD  CARD: RRR; S1 and S2 appreciated; no murmurs, no clicks, no rubs, no gallops CHEST:  Chest wall is tender to palpation underneath the right breast along the right ribs.  No crepitus, ecchymosis, erythema, warmth, rash or other lesions present.   BREAST:  No breast mass palpated on exam.  Right breast is non tender to palpation.  No erythema, warmth, induration, fluctuance.   No nipple discharge.  No inverted nipple.  No skin changes.  No axillary lymphadenopathy. RESP: Normal chest excursion without splinting or tachypnea; breath sounds clear and equal bilaterally; no wheezes, no rhonchi, no rales, no hypoxia or respiratory distress, speaking full sentences ABD/GI: Normal bowel sounds; non-distended; soft, non-tender, no rebound, no guarding, no peritoneal signs, no hepatosplenomegaly BACK:  The back appears normal and is non-tender to palpation, there is no CVA tenderness EXT: Normal ROM in all joints; non-tender to palpation; no edema; normal capillary refill; no cyanosis, no calf tenderness or swelling    SKIN: Normal color for age and race; warm; no rash NEURO: Moves all extremities equally PSYCH: The patient's mood and manner are appropriate. Grooming and personal hygiene are appropriate.  MEDICAL DECISION MAKING: Patient here with right rib pain after a mammogram.  She drove herself to the emergency department.  Will give her ibuprofen for pain.  Will avoid Tylenol given her history of hepatitis C.  Will obtain x-rays of her ribs.  Her breast appears normal without masses, signs of infection, hematoma.  Breast tissue itself is not tender.  She is tender over the bones.  Suspect contusion versus fracture.  Doubt ACS, PE, dissection.  ED PROGRESS: X-ray shows no fracture, pneumothorax, infiltrate.  Suspect contusion.  Will discharge with a short course of oxycodone for pain control.  Have recommended she follow-up with her PCP Dr. Wynetta Emery who ordered the mammogram if symptoms are not improving with time.  Recommended she continue ibuprofen.  Discussed return precautions.  At this time, I do not feel there is any life-threatening condition present. I have reviewed and discussed all results (EKG, imaging, lab, urine as appropriate) and exam findings with patient/family. I have reviewed nursing notes and appropriate previous records.  I feel the patient is safe to be  discharged home without further emergent workup and can continue workup as an outpatient as needed. Discussed usual and customary return precautions. Patient/family verbalize understanding and are comfortable with this plan.  Outpatient follow-up has been provided if needed. All questions have been answered.      Masai Kidd, Delice Bison, DO 05/06/17 0127

## 2017-05-05 NOTE — ED Triage Notes (Signed)
Pt states that she has lost a lot of weight this past year due to Hep C, she had a routine mammogram last Monday and she thinks when they put her right breast in the machine they bruised it, she complains of being short of breath and right breast tenderness since her mammogram Pt says that it feels better when she doesn't have a bra on or anything touching it

## 2017-05-06 ENCOUNTER — Emergency Department (HOSPITAL_COMMUNITY): Payer: No Typology Code available for payment source

## 2017-05-06 MED ORDER — OXYCODONE HCL 5 MG PO TABS: 5.0000 mg | ORAL_TABLET | Freq: Four times a day (QID) | ORAL | 0 refills | Status: DC | PRN

## 2017-05-06 MED ORDER — IBUPROFEN 800 MG PO TABS: 800.0000 mg | ORAL_TABLET | Freq: Three times a day (TID) | ORAL | 0 refills | Status: DC | PRN

## 2017-05-06 MED ORDER — IBUPROFEN 800 MG PO TABS
800.0000 mg | ORAL_TABLET | Freq: Once | ORAL | Status: AC
  Administered 2017-05-06: 800 mg via ORAL
  Filled 2017-05-06: qty 1

## 2017-05-06 MED FILL — IBUPROFEN 800 MG TABLET: 800 | 10 days supply | Qty: 30 | Fill #0

## 2017-05-12 ENCOUNTER — Ambulatory Visit: Payer: No Typology Code available for payment source

## 2017-05-13 ENCOUNTER — Ambulatory Visit: Payer: No Typology Code available for payment source

## 2017-05-14 MED FILL — LOSARTAN-HCTZ 100-25 MG TAB: 100-25 | 30 days supply | Qty: 30 | Fill #1

## 2017-05-14 MED FILL — ?CARVEDILOL 6.25 MG TABLET: 6.25 | 60 days supply | Qty: 60 | Fill #0

## 2017-05-16 ENCOUNTER — Encounter: Payer: Self-pay | Admitting: Internal Medicine

## 2017-05-16 ENCOUNTER — Ambulatory Visit: Payer: Self-pay | Attending: Internal Medicine | Admitting: Internal Medicine

## 2017-05-16 VITALS — BP 216/107 | HR 58 | Temp 98.0°F | Resp 16 | Wt 110.4 lb

## 2017-05-16 DIAGNOSIS — I10 Essential (primary) hypertension: Secondary | ICD-10-CM

## 2017-05-16 DIAGNOSIS — M1711 Unilateral primary osteoarthritis, right knee: Secondary | ICD-10-CM | POA: Insufficient documentation

## 2017-05-16 DIAGNOSIS — F17210 Nicotine dependence, cigarettes, uncomplicated: Secondary | ICD-10-CM | POA: Insufficient documentation

## 2017-05-16 DIAGNOSIS — Z8261 Family history of arthritis: Secondary | ICD-10-CM | POA: Insufficient documentation

## 2017-05-16 DIAGNOSIS — B182 Chronic viral hepatitis C: Secondary | ICD-10-CM | POA: Insufficient documentation

## 2017-05-16 DIAGNOSIS — Z8249 Family history of ischemic heart disease and other diseases of the circulatory system: Secondary | ICD-10-CM | POA: Insufficient documentation

## 2017-05-16 DIAGNOSIS — Z791 Long term (current) use of non-steroidal anti-inflammatories (NSAID): Secondary | ICD-10-CM | POA: Insufficient documentation

## 2017-05-16 DIAGNOSIS — R634 Abnormal weight loss: Secondary | ICD-10-CM

## 2017-05-16 DIAGNOSIS — R0789 Other chest pain: Secondary | ICD-10-CM

## 2017-05-16 DIAGNOSIS — Z820 Family history of epilepsy and other diseases of the nervous system: Secondary | ICD-10-CM | POA: Insufficient documentation

## 2017-05-16 DIAGNOSIS — Z79899 Other long term (current) drug therapy: Secondary | ICD-10-CM | POA: Insufficient documentation

## 2017-05-16 DIAGNOSIS — Z79891 Long term (current) use of opiate analgesic: Secondary | ICD-10-CM | POA: Insufficient documentation

## 2017-05-16 DIAGNOSIS — Z9889 Other specified postprocedural states: Secondary | ICD-10-CM | POA: Insufficient documentation

## 2017-05-16 DIAGNOSIS — M25511 Pain in right shoulder: Secondary | ICD-10-CM | POA: Insufficient documentation

## 2017-05-16 NOTE — Patient Instructions (Signed)
Give appointment with Carola Frost in 2 weeks for repeat BP check.  Please call [facility] and speak with Ms. Harris regarding scheduling you for colonoscopy.

## 2017-05-16 NOTE — Progress Notes (Signed)
Patient ID: Lori Bush, female    DOB: 11-20-1960  MRN: [id number]  CC: Hospitalization Follow-up (Ed )   Subjective: Lori Bush is a 56 y.o. female who presents for BP check and ER f/u Her concerns today include:  Hx of HTN, hep C on Harvoni through ID Dr. Linus Salmons, tobacco dep.  1. HTN: Compliant with meds but did not take meds as yet for the morning. Just pick up Rfs this a.m; last dose yesterday -no HA/dizziness/CP/SOB  2. Seen in ER 05/05/2017 with RT anterior rib cage pain -attributes it to MMG that she had 6 days prior. Pain started 2 days after having MMG. Felt like a strain when she raised RT arm.  Rib cage under RT breast was tender to touch. Had to wear a non-wire bra for several days.  No SOB, LE edema, recent long distance travel. She is not on any hormonal therapy. CXR in ER neg -pain has subsided  3. Hep C: on last mth of Harvoni. Initially Harvoni caused dec appetite, nausea and wgh loss. Started taking at nights and now tolerating better  Patient Active Problem List   Diagnosis Date Noted  . Tobacco dependence 03/17/2017  . Cyst of skin 03/17/2017  . Primary osteoarthritis of right knee 10/28/2016  . Chronic hepatitis C without hepatic coma ([place]) 03/26/2016  . Chronic right shoulder pain 01/17/2015  . Hypertension 03/05/2013     Current Outpatient Medications on File Prior to Visit  Medication Sig Dispense Refill  . albuterol (PROVENTIL HFA;VENTOLIN HFA) 108 (90 Base) MCG/ACT inhaler Inhale 2 puffs into the lungs every 4 (four) hours as needed for wheezing or shortness of breath. 1 Inhaler 2  . amLODipine (NORVASC) 10 MG tablet Take 1 tablet (10 mg total) by mouth daily. 90 tablet 3  . carvedilol (COREG) 6.25 MG tablet Take 0.5 tablets (3.125 mg total) by mouth 2 (two) times daily with a meal. 60 tablet 5  . cetirizine (ZYRTEC) 10 MG tablet Take 1 tablet (10 mg total) by mouth daily. (Patient taking differently: Take 10 mg by mouth daily as  needed for allergies. ) 30 tablet 11  . fluticasone (FLONASE) 50 MCG/ACT nasal spray Place 2 sprays into both nostrils at bedtime. 16 g 2  . ibuprofen (ADVIL,MOTRIN) 800 MG tablet Take 1 tablet (800 mg total) by mouth every 8 (eight) hours as needed for mild pain. 30 tablet 0  . Ledipasvir-Sofosbuvir (HARVONI) 90-400 MG TABS Take 1 tablet by mouth daily. 28 tablet 2  . losartan-hydrochlorothiazide (HYZAAR) 100-25 MG tablet Take 1 tablet by mouth daily. 30 tablet 2  . ondansetron (ZOFRAN ODT) 4 MG disintegrating tablet Take 1 tablet (4 mg total) by mouth every 8 (eight) hours as needed for nausea or vomiting. 20 tablet 2  . oxyCODONE (ROXICODONE) 5 MG immediate release tablet Take 1-2 tablets (5-10 mg total) by mouth every 6 (six) hours as needed for severe pain. 10 tablet 0   No current facility-administered medications on file prior to visit.     No Known Allergies  Social History   Socioeconomic History  . Marital status: Single    Spouse name: Not on file  . Number of children: Not on file  . Years of education: Not on file  . Highest education level: Not on file  Social Needs  . Financial resource strain: Not on file  . Food insecurity - worry: Not on file  . Food insecurity - inability: Not on file  . Transportation needs -  medical: Not on file  . Transportation needs - non-medical: Not on file  Occupational History  . Not on file  Tobacco Use  . Smoking status: Current Every Day Smoker    Packs/day: 0.50    Years: 31.00    Pack years: 15.50    Types: Cigarettes  . Smokeless tobacco: Never Used  . Tobacco comment: states she cut back 3 cigarettes/day  Substance and Sexual Activity  . Alcohol use: No  . Drug use: No  . Sexual activity: Not on file  Other Topics Concern  . Not on file  Social History Narrative  . Not on file    Family History  Problem Relation Age of Onset  . Hypertension Mother   . Heart disease Mother   . Hypertension Father   . Dementia  Father   . Arthritis Sister   . Hypertension Sister   . Arthritis Brother   . Hypertension Brother     Past Surgical History:  Procedure Laterality Date  . CESAREAN SECTION    . HERNIA REPAIR     as a child  . WISDOM TOOTH EXTRACTION      ROS: Review of Systems Negative except as stated above PHYSICAL EXAM: BP (!) 216/107   Pulse (!) 58   Temp 98 F (36.7 C) (Oral)   Resp 16   Wt 110 lb 6.4 oz (50.1 kg)   SpO2 99%   BMI 21.56 kg/m   Wt Readings from Last 3 Encounters:  05/16/17 110 lb 6.4 oz (50.1 kg)  05/05/17 107 lb (48.5 kg)  03/17/17 109 lb 9.6 oz (49.7 kg)  Repeat BP 180/106  Physical Exam General appearance - alert, well appearing, middle-aged African-American female and in no distress Mental status - alert, oriented to person, place, and time, normal mood, behavior, speech, dress, motor activity, and thought processes Neck - supple, no significant adenopathy.  No thyroid enlargement Chest - clear to auscultation, no wheezes, rales or rhonchi, symmetric air entry Heart - normal rate, regular rhythm, normal S1, S2, no murmurs, rubs, clicks or gallops Extremities - peripheral pulses normal, no pedal edema, no clubbing or cyanosis  Lab Results  Component Value Date   WBC 7.9 12/23/2016   HGB 15.2 12/23/2016   HCT 44.5 12/23/2016   MCV 86.4 12/23/2016   PLT 269 12/23/2016     Chemistry      Component Value Date/Time   NA 141 12/23/2016 1413   K 3.6 12/23/2016 1413   CL 107 12/23/2016 1413   CO2 25 12/23/2016 1413   BUN 12 12/23/2016 1413   CREATININE 0.82 12/23/2016 1413      Component Value Date/Time   CALCIUM 10.1 12/23/2016 1413   ALKPHOS 70 12/23/2016 1413   AST 28 12/23/2016 1413   ALT 39 (H) 12/23/2016 1413   ALT 39 (H) 12/23/2016 1413   BILITOT 0.5 12/23/2016 1413      ASSESSMENT AND PLAN: 1. Essential hypertension -not tat goal. She has not taken meds as yet for the morning, she will do so once she returns home and eat  2.  Right-sided chest wall pain -resolved  3. Weight loss  she was at 118 lb 11/2016 -over due for colonoscopy. Referred on last visit. [facility] called her and left message on VM 04/16/2017. Advise her to call them back so pt can be schedule. - TSH  Patient was given the opportunity to ask questions.  Patient verbalized understanding of the plan and was able to repeat  key elements of the plan.   Orders Placed This Encounter  Procedures  . TSH     Requested Prescriptions    No prescriptions requested or ordered in this encounter    Return in about 3 months (around 08/16/2017).  Karle Plumber, MD, FACP

## 2017-05-17 LAB — TSH: TSH: 3.22 u[IU]/mL (ref 0.450–4.500)

## 2017-06-17 ENCOUNTER — Ambulatory Visit: Payer: No Typology Code available for payment source | Admitting: Internal Medicine

## 2017-06-18 ENCOUNTER — Encounter: Payer: Self-pay | Admitting: Internal Medicine

## 2017-06-18 MED FILL — LOSARTAN-HCTZ 100-25 MG TAB: 100-25 | 30 days supply | Qty: 30 | Fill #2

## 2017-06-20 ENCOUNTER — Ambulatory Visit: Payer: Self-pay | Attending: Internal Medicine | Admitting: Internal Medicine

## 2017-06-20 ENCOUNTER — Encounter: Payer: Self-pay | Admitting: Internal Medicine

## 2017-06-20 VITALS — BP 177/104 | HR 71 | Temp 98.2°F | Resp 18 | Ht 60.0 in | Wt 109.0 lb

## 2017-06-20 DIAGNOSIS — F17210 Nicotine dependence, cigarettes, uncomplicated: Secondary | ICD-10-CM | POA: Insufficient documentation

## 2017-06-20 DIAGNOSIS — I10 Essential (primary) hypertension: Secondary | ICD-10-CM | POA: Insufficient documentation

## 2017-06-20 DIAGNOSIS — B182 Chronic viral hepatitis C: Secondary | ICD-10-CM | POA: Insufficient documentation

## 2017-06-20 DIAGNOSIS — M1711 Unilateral primary osteoarthritis, right knee: Secondary | ICD-10-CM | POA: Insufficient documentation

## 2017-06-20 DIAGNOSIS — Z79899 Other long term (current) drug therapy: Secondary | ICD-10-CM | POA: Insufficient documentation

## 2017-06-20 DIAGNOSIS — Z1211 Encounter for screening for malignant neoplasm of colon: Secondary | ICD-10-CM

## 2017-06-20 MED ORDER — CARVEDILOL 6.25 MG PO TABS: 3.1250 mg | ORAL_TABLET | Freq: Two times a day (BID) | ORAL | 5 refills | Status: DC

## 2017-06-20 MED FILL — ?CARVEDILOL 6.25 MG TABLET: 6.25 | 60 days supply | Qty: 60 | Fill #1
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
## 2017-06-23 LAB — HEPATITIS C VRS RNA DETECT BY PCR-QUAL: HCV RNA NAA QUALITATIVE: NEGATIVE

## 2017-06-24 ENCOUNTER — Ambulatory Visit (INDEPENDENT_AMBULATORY_CARE_PROVIDER_SITE_OTHER): Payer: Self-pay | Admitting: Pharmacist

## 2017-06-24 DIAGNOSIS — Z23 Encounter for immunization: Secondary | ICD-10-CM

## 2017-06-24 DIAGNOSIS — B182 Chronic viral hepatitis C: Secondary | ICD-10-CM

## 2017-06-24 NOTE — Progress Notes (Signed)
[facility] Pharmacy Visit  HPI: Lori Bush is a 56 y.o. female who presents to the [facility] today for follow-up of her Hep C infection. She has genotype 1a, F1/F2 fibrosis, and completed 12 weeks of Harvoni around 2 weeks ago.  Patient Active Problem List   Diagnosis Date Noted  . Tobacco dependence 03/17/2017  . Cyst of skin 03/17/2017  . Primary osteoarthritis of right knee 10/28/2016  . Chronic hepatitis C without hepatic coma ([place]) 03/26/2016  . Chronic right shoulder pain 01/17/2015  . Hypertension 03/05/2013      Medication List        Accurate as of 06/24/17  1:28 PM. Always use your most recent med list.          albuterol 108 (90 Base) MCG/ACT inhaler Commonly known as:  PROVENTIL HFA;VENTOLIN HFA Inhale 2 puffs into the lungs every 4 (four) hours as needed for wheezing or shortness of breath.   amLODipine 10 MG tablet Commonly known as:  NORVASC Take 1 tablet (10 mg total) by mouth daily.   carvedilol 6.25 MG tablet Commonly known as:  COREG Take 0.5 tablets (3.125 mg total) by mouth 2 (two) times daily with a meal.   cetirizine 10 MG tablet Commonly known as:  ZYRTEC Take 1 tablet (10 mg total) by mouth daily.   fluticasone 50 MCG/ACT nasal spray Commonly known as:  FLONASE Place 2 sprays into both nostrils at bedtime.   ibuprofen 800 MG tablet Commonly known as:  ADVIL,MOTRIN Take 1 tablet (800 mg total) by mouth every 8 (eight) hours as needed for mild pain.   Ledipasvir-Sofosbuvir 90-400 MG Tabs Commonly known as:  HARVONI Take 1 tablet by mouth daily.   losartan-hydrochlorothiazide 100-25 MG tablet Commonly known as:  HYZAAR Take 1 tablet by mouth daily.   ondansetron 4 MG disintegrating tablet Commonly known as:  ZOFRAN ODT Take 1 tablet (4 mg total) by mouth every 8 (eight) hours as needed for nausea or vomiting.   oxyCODONE 5 MG immediate release tablet Commonly known as:  ROXICODONE Take  1-2 tablets (5-10 mg total) by mouth every 6 (six) hours as needed for severe pain.       Allergies: No Known Allergies  Past Medical History: Past Medical History:  Diagnosis Date  . Arthritis Dx 2012  . Hypertension Dx 2005    Social History: Social History   Socioeconomic History  . Marital status: Single    Spouse name: Not on file  . Number of children: Not on file  . Years of education: Not on file  . Highest education level: Not on file  Social Needs  . Financial resource strain: Not on file  . Food insecurity - worry: Not on file  . Food insecurity - inability: Not on file  . Transportation needs - medical: Not on file  . Transportation needs - non-medical: Not on file  Occupational History  . Not on file  Tobacco Use  . Smoking status: Current Every Day Smoker    Packs/day: 0.50    Years: 31.00    Pack years: 15.50    Types: Cigarettes  . Smokeless tobacco: Never Used  . Tobacco comment: states she cut back 3 cigarettes/day  Substance and Sexual Activity  . Alcohol use: No  . Drug use: No  . Sexual activity: Not on file  Other Topics Concern  . Not on file  Social History Narrative  . Not on file    Labs:  Hep B S Ab (no units)  Date Value  12/23/2016 NEG   Hepatitis B Surface Ag (no units)  Date Value  12/23/2016 NEGATIVE   HCV Ab (no units)  Date Value  03/22/2016 REACTIVE (A)    Lab Results  Component Value Date   HCVGENOTYPE 1a 12/23/2016    Hepatitis C RNA quantitative Latest Ref Rng & Units 04/09/2017 03/22/2016  HCV Quantitative <15 IU/mL - 3,845,364(W)  HCV Quantitative Log NOT DETECT Log IU/mL <1.18 NOT DETECTED 6.05(H)    AST (U/L)  Date Value  12/23/2016 28  03/26/2014 36  02/10/2014 27   ALT (U/L)  Date Value  12/23/2016 39 (H)  12/23/2016 39 (H)  03/26/2014 31  02/10/2014 24   INR (no units)  Date Value  12/23/2016 1.0    Fibrosis Score: F1/F2 as assessed by [facility]   Assessment: Lori Bush is here today to  follow-up for Hep C.  She completed 12 weeks of Harvoni around 2 weeks ago.  She had some significant nausea when taking the medication, so we gave her some Zofran last time she was here.  She stated that it helped greatly, and she was not nauseous anymore from there on out.  She did not miss a single dose.  Her early on treatment viral load in October was undetectable.  Will get another viral load today and bring her back in 3 months for her cure visit.   Plan: - Completed 12 weeks of Harvoni - HCV RNA today - F/u with me again 09/16/17 at 930am  Thelma Lorenzetti L. Jerlean Peralta, PharmD, [place], [facility] 06/24/2017, 1:28 PM

## 2017-06-26 LAB — HEPATITIS C RNA QUANTITATIVE
HCV QUANT LOG: NOT DETECTED {Log_IU}/mL
HCV RNA, PCR, QN: NOT DETECTED [IU]/mL

## 2017-07-11 IMAGING — US US ABDOMEN COMPLETE W/ ELASTOGRAPHY
1 series · 13 of 25 positions shown · non-contrast
Comparison: None.

CLINICAL DATA: Hepatitis-C



[Series 1: us abdomen complete w/ elastography · 0.19mm/px · 13 of 74 slices shown]
[im 1/74]
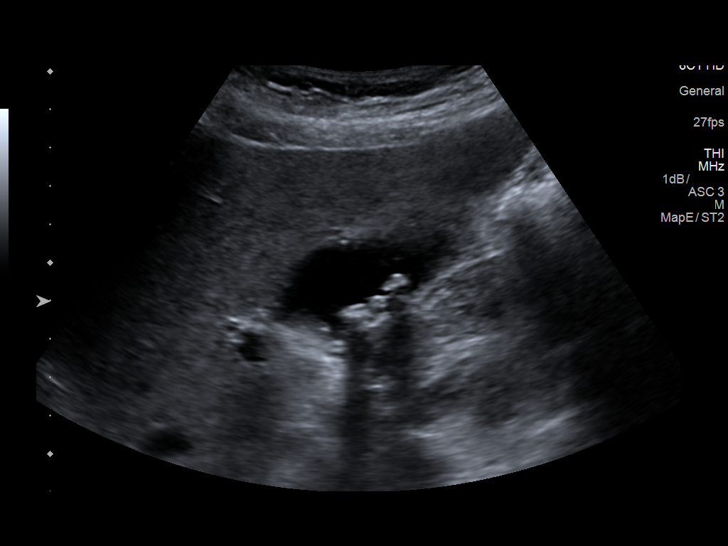
[im 7/74]
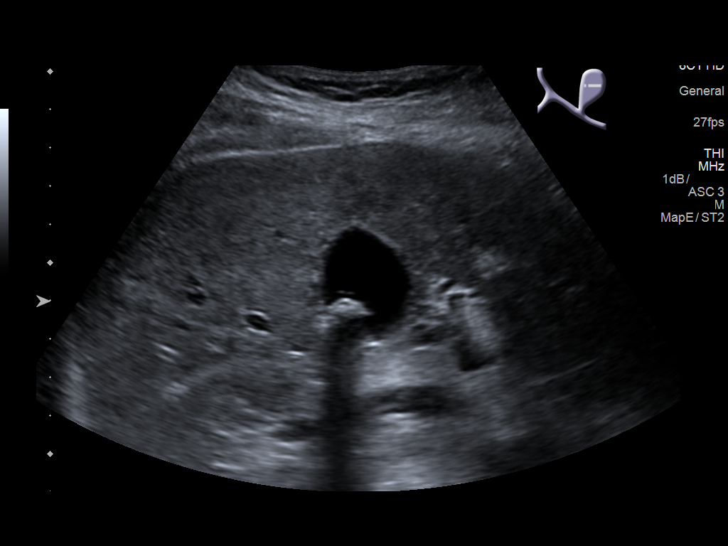
[im 13/74]
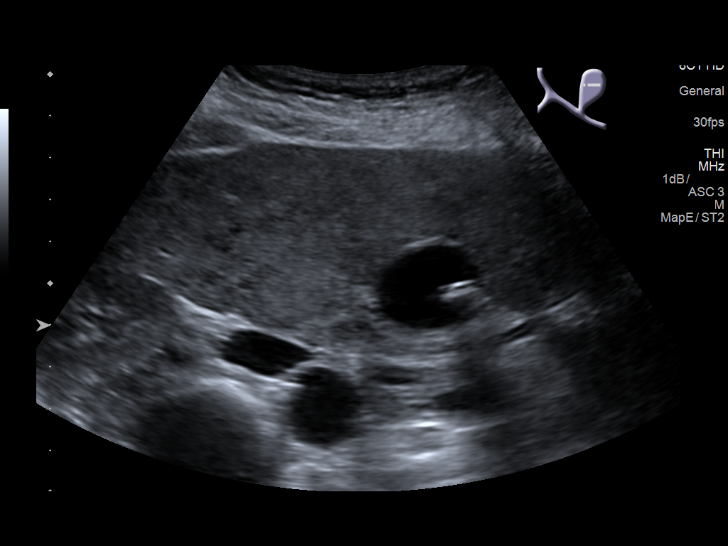
[im 19/74]
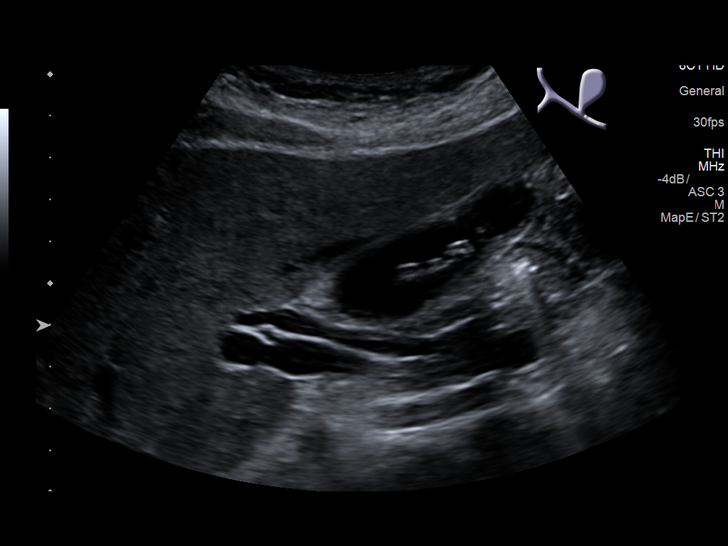
[im 25/74]
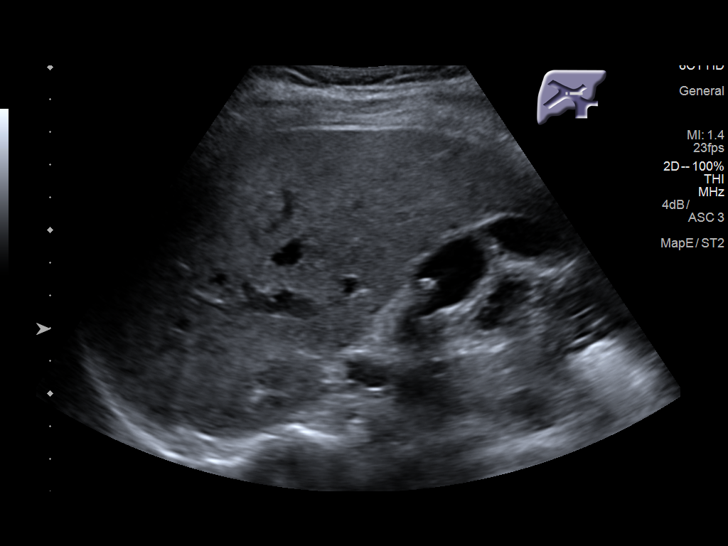
[im 31/74]
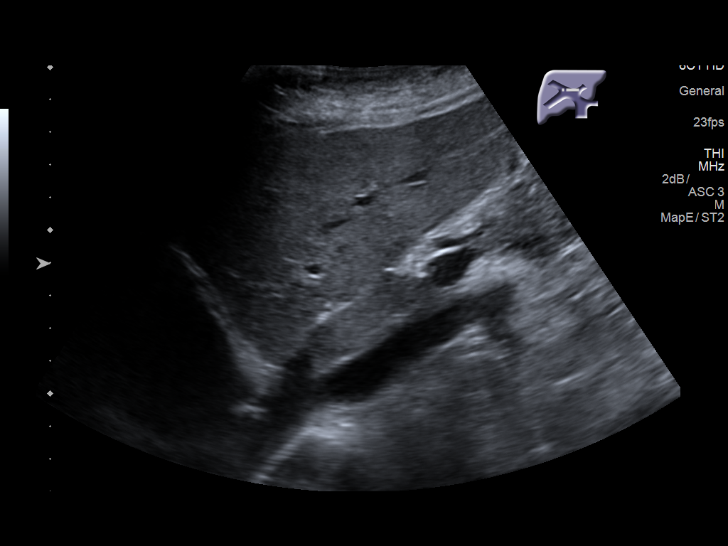
[im 37/74]
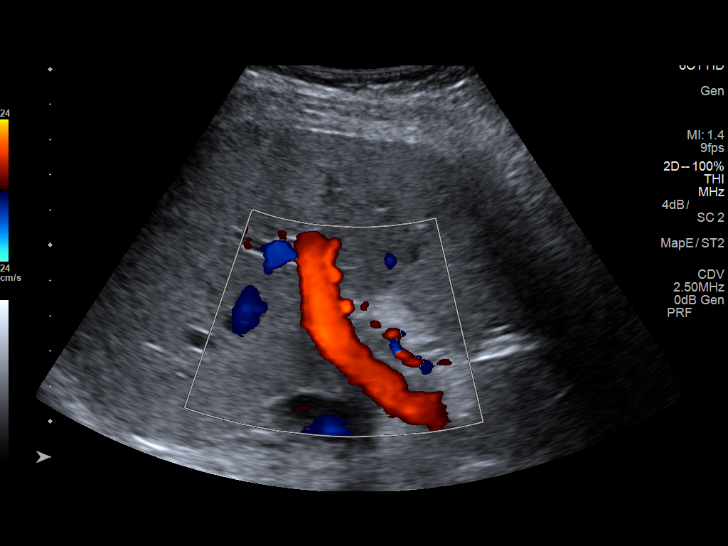
[im 43/74]
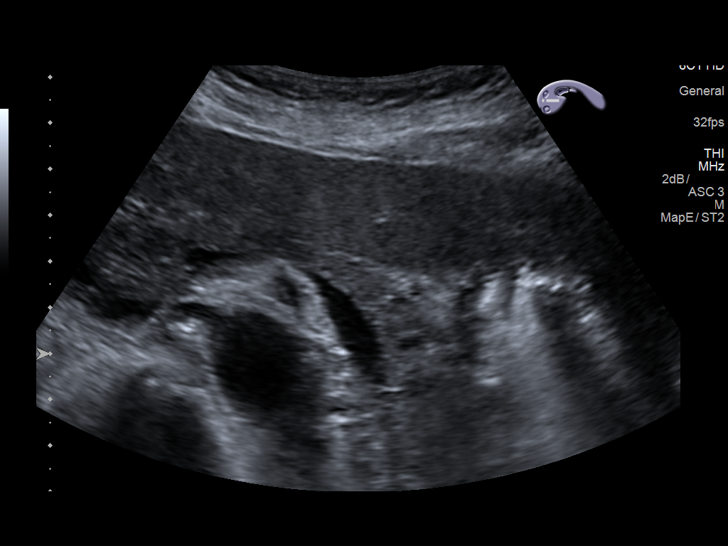
[im 49/74]
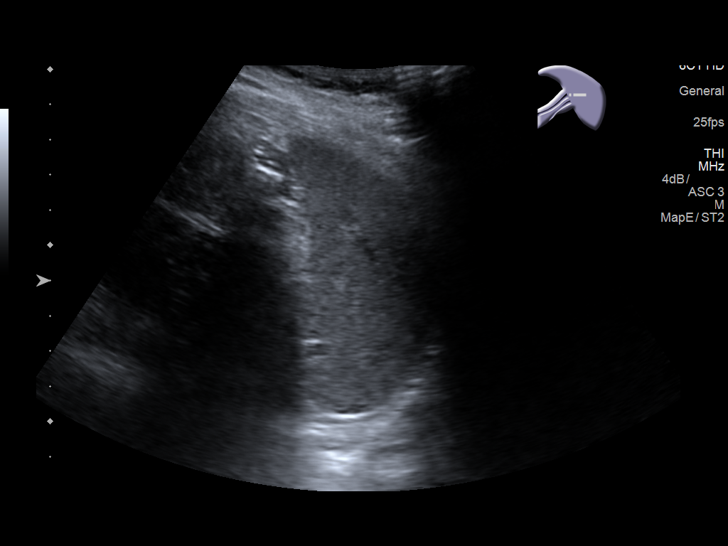
[im 55/74]
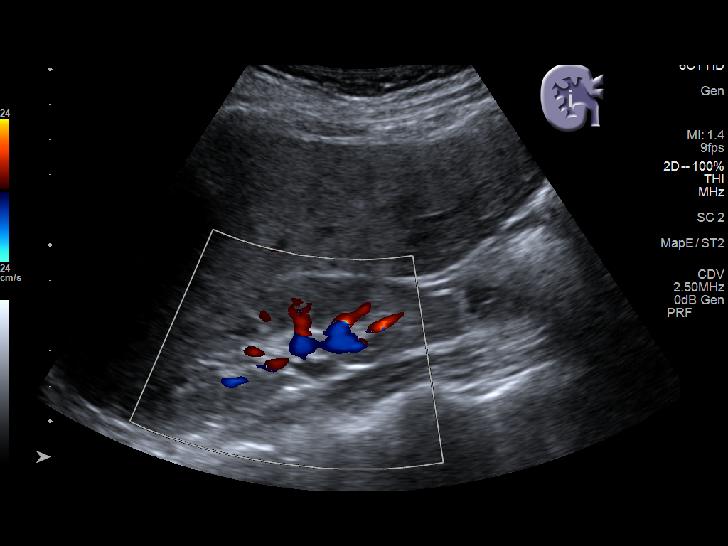
[im 61/74]
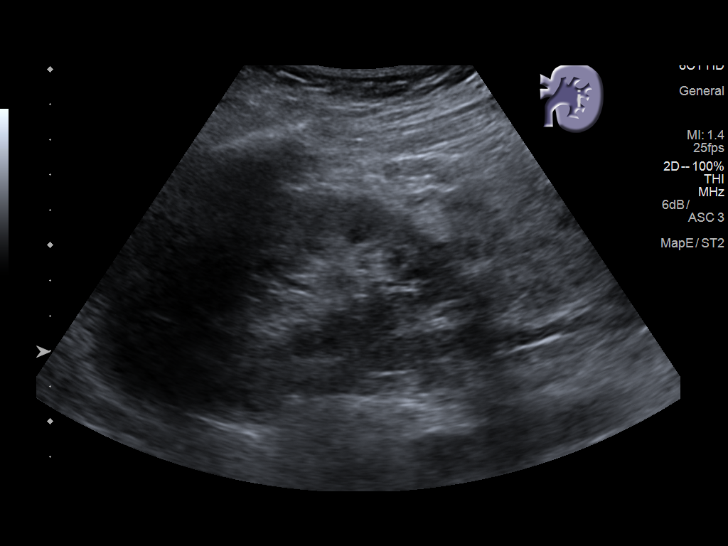
[im 67/74]
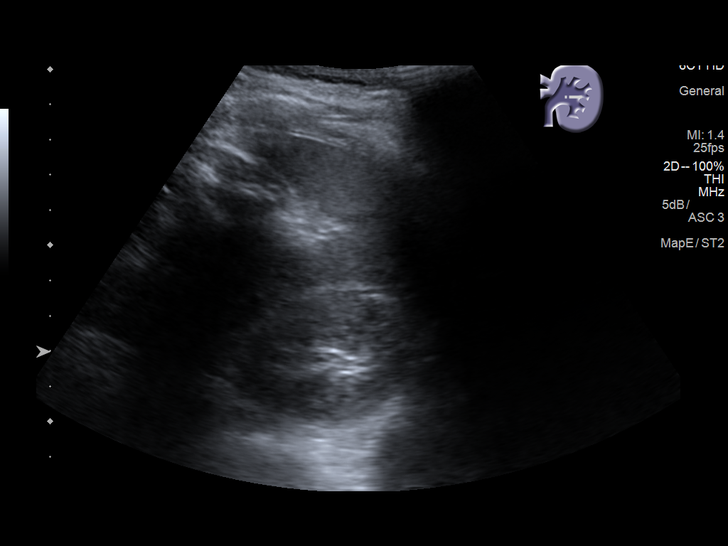
[im 74/74]
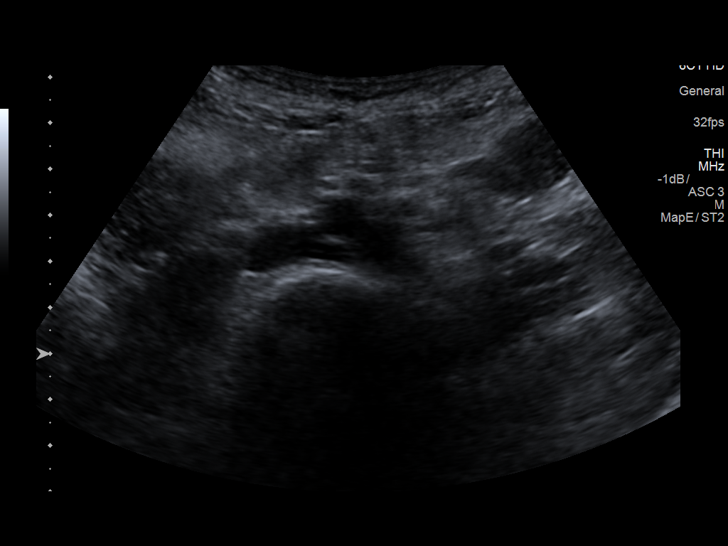

[13 of 25 positions shown; findings below may reference images not displayed]

FINDINGS: ULTRASOUND ABDOMEN

Gallbladder: Gallstones crash that mobile gallstones within the
gallbladder, the largest 11 mm.

Common bile duct: Diameter: Normal caliber, 5 mm.

Liver: No focal lesion identified. Within normal limits in
parenchymal echogenicity.

IVC: No abnormality visualized.

Pancreas: Visualized portion unremarkable.

Spleen: Size and appearance within normal limits.

Right Kidney: Length: 10.7 cm. Echogenicity within normal limits. No
mass or hydronephrosis visualized.

Left Kidney: Length: 10.9 cm. Echogenicity within normal limits. No
mass or hydronephrosis visualized.

Abdominal aorta: No aneurysm visualized.

Other findings: None.

ULTRASOUND HEPATIC ELASTOGRAPHY

Device: Siemens Helix VTQ

Patient position: Left lateral decubitus

Transducer 6C1

Number of measurements: 10

Hepatic segment:  8

Median velocity:   1.44  m/sec

IQR:

IQR/Median velocity ratio:

Corresponding Metavir fibrosis score:  F2 + some F3

Risk of fibrosis: Moderate

Limitations of exam: None

Pertinent findings noted on other imaging exams:  None

Please note that abnormal shear wave velocities may also be
identified in clinical settings other than with hepatic fibrosis,
such as: acute hepatitis, elevated right heart and central venous
pressures including use of beta blockers, Uest disease
([place]), infiltrative processes such as
mastocytosis/amyloidosis/infiltrative tumor, extrahepatic
cholestasis, in the post-prandial state, and liver transplantation.
Correlation with patient history, laboratory data, and clinical
condition recommended.
IMPRESSION: ULTRASOUND ABDOMEN:
Cholelithiasis.

ULTRASOUND HEPATIC ELASTOGRAPHY:

Median hepatic shear wave velocity is calculated at 1.44 m/sec.

Corresponding Metavir fibrosis score is  F2 + some F3.

Risk of fibrosis is Moderate.

Follow-up: Additional testing appropriate

## 2017-07-24 ENCOUNTER — Encounter: Payer: Self-pay | Admitting: Internal Medicine

## 2017-07-29 ENCOUNTER — Other Ambulatory Visit: Payer: Self-pay | Admitting: Internal Medicine

## 2017-07-29 MED FILL — LOSARTAN-HCTZ 100-25 MG TAB: 100-25 | 90 days supply | Qty: 90 | Fill #0

## 2017-07-29 MED FILL — AMLODIPINE BESYLATE 10 MG T: 10 | 90 days supply | Qty: 90 | Fill #2

## 2017-08-21 ENCOUNTER — Ambulatory Visit: Payer: No Typology Code available for payment source | Admitting: Internal Medicine

## 2017-09-16 ENCOUNTER — Ambulatory Visit (INDEPENDENT_AMBULATORY_CARE_PROVIDER_SITE_OTHER): Payer: No Typology Code available for payment source | Admitting: Pharmacist

## 2017-09-16 DIAGNOSIS — B182 Chronic viral hepatitis C: Secondary | ICD-10-CM

## 2017-09-16 NOTE — Progress Notes (Signed)
[facility] for [facility]ious Disease Pharmacy Visit  HPI: Lyza Houseworth is a 57 y.o. female who presents to the [facility] for her Hep C cure visit.  She has genotype 1a, F1/F2, and completed 12 weeks of Harvoni at the beginning of December.  Patient Active Problem List   Diagnosis Date Noted  . Tobacco dependence 03/17/2017  . Cyst of skin 03/17/2017  . Primary osteoarthritis of right knee 10/28/2016  . Chronic hepatitis C without hepatic coma ([place]) 03/26/2016  . Chronic right shoulder pain 01/17/2015  . Hypertension 03/05/2013    Patient's Medications  New Prescriptions   No medications on file  Previous Medications   ALBUTEROL (PROVENTIL HFA;VENTOLIN HFA) 108 (90 BASE) MCG/ACT INHALER    Inhale 2 puffs into the lungs every 4 (four) hours as needed for wheezing or shortness of breath.   AMLODIPINE (NORVASC) 10 MG TABLET    Take 1 tablet (10 mg total) by mouth daily.   CARVEDILOL (COREG) 6.25 MG TABLET    Take 0.5 tablets (3.125 mg total) by mouth 2 (two) times daily with a meal.   CETIRIZINE (ZYRTEC) 10 MG TABLET    Take 1 tablet (10 mg total) by mouth daily.   FLUTICASONE (FLONASE) 50 MCG/ACT NASAL SPRAY    Place 2 sprays into both nostrils at bedtime.   IBUPROFEN (ADVIL,MOTRIN) 800 MG TABLET    Take 1 tablet (800 mg total) by mouth every 8 (eight) hours as needed for mild pain.   LOSARTAN-HYDROCHLOROTHIAZIDE (HYZAAR) 100-25 MG TABLET    TAKE 1 TABLET BY MOUTH DAILY.   ONDANSETRON (ZOFRAN ODT) 4 MG DISINTEGRATING TABLET    Take 1 tablet (4 mg total) by mouth every 8 (eight) hours as needed for nausea or vomiting.   OXYCODONE (ROXICODONE) 5 MG IMMEDIATE RELEASE TABLET    Take 1-2 tablets (5-10 mg total) by mouth every 6 (six) hours as needed for severe pain.  Modified Medications   No medications on file  Discontinued Medications   LEDIPASVIR-SOFOSBUVIR (HARVONI) 90-400 MG TABS    Take 1 tablet by mouth daily.    Allergies: No Known Allergies  Past Medical  History: Past Medical History:  Diagnosis Date  . Arthritis Dx 2012  . Hypertension Dx 2005    Social History: Social History   Socioeconomic History  . Marital status: Single    Spouse name: Not on file  . Number of children: Not on file  . Years of education: Not on file  . Highest education level: Not on file  Social Needs  . Financial resource strain: Not on file  . Food insecurity - worry: Not on file  . Food insecurity - inability: Not on file  . Transportation needs - medical: Not on file  . Transportation needs - non-medical: Not on file  Occupational History  . Not on file  Tobacco Use  . Smoking status: Current Every Day Smoker    Packs/day: 0.50    Years: 31.00    Pack years: 15.50    Types: Cigarettes  . Smokeless tobacco: Never Used  . Tobacco comment: states she cut back 3 cigarettes/day  Substance and Sexual Activity  . Alcohol use: No  . Drug use: No  . Sexual activity: Not on file  Other Topics Concern  . Not on file  Social History Narrative  . Not on file    Labs: Hep B S Ab (no units)  Date Value  12/23/2016 NEG   Hepatitis B Surface Ag (no units)  Date Value  12/23/2016 NEGATIVE   HCV Ab (no units)  Date Value  03/22/2016 REACTIVE (A)    Lab Results  Component Value Date   HCVGENOTYPE 1a 12/23/2016    Hepatitis C RNA quantitative Latest Ref Rng & Units 06/24/2017 04/09/2017 03/22/2016  HCV Quantitative <15 IU/mL - - 9,381,017(P)  HCV Quantitative Log NOT DETECT Log IU/mL <1.18 NOT DETECTED <1.18 NOT DETECTED 6.05(H)    AST (U/L)  Date Value  12/23/2016 28  03/26/2014 36  02/10/2014 27   ALT (U/L)  Date Value  12/23/2016 39 (H)  12/23/2016 39 (H)  03/26/2014 31  02/10/2014 24   INR (no units)  Date Value  12/23/2016 1.0    Fibrosis Score: F2/F3 as assessed by elastography   Assessment: Lori Bush is here today to follow-up for her Hep C cure visit.  She finished 12 weeks of Harvoni back in December with no missed  doses and occasional nausea that resolved with Zofran.  Her early on therapy and end of therapy Hep C viral load were both undetectable.  I will repeat the Hep C viral load today to assess SVR12. I counseled on the risk of reinfection. I will call her with the results.   Plan: - Hep C viral load - Call with results  Krystena Reitter L. Marquerite Forsman, PharmD, [place], [place] Infectious Disease 09/16/2017, 2:43 PM

## 2017-09-17 LAB — COMPREHENSIVE METABOLIC PANEL
AG RATIO: 1.4 (calc) (ref 1.0–2.5)
ALBUMIN MSPROF: 3.9 g/dL (ref 3.6–5.1)
ALKALINE PHOSPHATASE (APISO): 56 U/L (ref 33–130)
ALT: 9 U/L (ref 6–29)
AST: 16 U/L (ref 10–35)
BILIRUBIN TOTAL: 0.6 mg/dL (ref 0.2–1.2)
BUN: 16 mg/dL (ref 7–25)
CALCIUM: 10.1 mg/dL (ref 8.6–10.4)
CO2: 25 mmol/L (ref 20–32)
Chloride: 106 mmol/L (ref 98–110)
Creat: 0.84 mg/dL (ref 0.50–1.05)
Globulin: 2.8 g/dL (calc) (ref 1.9–3.7)
Glucose, Bld: 90 mg/dL (ref 65–99)
POTASSIUM: 3.7 mmol/L (ref 3.5–5.3)
Sodium: 138 mmol/L (ref 135–146)
Total Protein: 6.7 g/dL (ref 6.1–8.1)

## 2017-09-18 LAB — HEPATITIS C RNA QUANTITATIVE
HCV Quantitative Log: <1.18 Log IU/mL
HCV RNA, PCR, QN: NOT DETECTED [IU]/mL

## 2017-09-19 ENCOUNTER — Telehealth: Payer: Self-pay | Admitting: Pharmacist

## 2017-09-19 NOTE — Telephone Encounter (Signed)
Called Lori Bush to let her know that she is cured of her Hep C infection.

## 2017-10-13 ENCOUNTER — Other Ambulatory Visit: Payer: Self-pay | Admitting: Internal Medicine

## 2017-10-13 MED FILL — CARVEDILOL 6.25 MG TABLET: 6.25 | 60 days supply | Qty: 60 | Fill #2

## 2017-10-21 MED FILL — LOSARTAN-HCTZ 100-25 MG TAB: 100-25 | 30 days supply | Qty: 30 | Fill #0

## 2017-11-07 ENCOUNTER — Telehealth: Payer: Self-pay

## 2017-11-07 NOTE — Telephone Encounter (Signed)
Rec'd from Jackson Latino forwarded to [employer]

## 2017-11-10 MED FILL — AMLODIPINE BESYLATE 10 MG T: 10 | 90 days supply | Qty: 90 | Fill #3

## 2017-11-11 ENCOUNTER — Encounter: Payer: Self-pay | Admitting: Gastroenterology

## 2017-11-17 ENCOUNTER — Ambulatory Visit: Payer: No Typology Code available for payment source | Attending: Internal Medicine | Admitting: Internal Medicine

## 2017-11-17 ENCOUNTER — Encounter: Payer: Self-pay | Admitting: Internal Medicine

## 2017-11-17 VITALS — BP 143/97 | HR 79 | Temp 98.2°F | Resp 16 | Wt 102.2 lb

## 2017-11-17 DIAGNOSIS — R6881 Early satiety: Secondary | ICD-10-CM

## 2017-11-17 DIAGNOSIS — Z681 Body mass index (BMI) 19 or less, adult: Secondary | ICD-10-CM | POA: Insufficient documentation

## 2017-11-17 DIAGNOSIS — J301 Allergic rhinitis due to pollen: Secondary | ICD-10-CM | POA: Diagnosis not present

## 2017-11-17 DIAGNOSIS — I10 Essential (primary) hypertension: Secondary | ICD-10-CM | POA: Insufficient documentation

## 2017-11-17 DIAGNOSIS — M1711 Unilateral primary osteoarthritis, right knee: Secondary | ICD-10-CM | POA: Insufficient documentation

## 2017-11-17 DIAGNOSIS — Z8249 Family history of ischemic heart disease and other diseases of the circulatory system: Secondary | ICD-10-CM | POA: Insufficient documentation

## 2017-11-17 DIAGNOSIS — F17210 Nicotine dependence, cigarettes, uncomplicated: Secondary | ICD-10-CM | POA: Insufficient documentation

## 2017-11-17 DIAGNOSIS — R634 Abnormal weight loss: Secondary | ICD-10-CM | POA: Diagnosis not present

## 2017-11-17 DIAGNOSIS — N898 Other specified noninflammatory disorders of vagina: Secondary | ICD-10-CM | POA: Insufficient documentation

## 2017-11-17 DIAGNOSIS — Z79899 Other long term (current) drug therapy: Secondary | ICD-10-CM | POA: Insufficient documentation

## 2017-11-17 DIAGNOSIS — B182 Chronic viral hepatitis C: Secondary | ICD-10-CM | POA: Insufficient documentation

## 2017-11-17 DIAGNOSIS — J302 Other seasonal allergic rhinitis: Secondary | ICD-10-CM | POA: Insufficient documentation

## 2017-11-17 DIAGNOSIS — F17200 Nicotine dependence, unspecified, uncomplicated: Secondary | ICD-10-CM | POA: Diagnosis not present

## 2017-11-17 MED ORDER — FLUTICASONE PROPIONATE 50 MCG/ACT NA SUSP: 2.0000 | Freq: Every day | NASAL | 2 refills | Status: DC

## 2017-11-17 MED ORDER — LORATADINE 10 MG PO TABS: 10.0000 mg | ORAL_TABLET | Freq: Every day | ORAL | 11 refills | Status: DC

## 2017-11-17 MED ORDER — LOSARTAN POTASSIUM-HCTZ 100-25 MG PO TABS: 1.0000 | ORAL_TABLET | Freq: Every day | ORAL | 6 refills | Status: DC

## 2017-11-17 MED ORDER — AMLODIPINE BESYLATE 10 MG PO TABS: 10.0000 mg | ORAL_TABLET | Freq: Every day | ORAL | 3 refills | Status: DC

## 2017-11-17 MED ORDER — LOSARTAN POTASSIUM-HCTZ 100-25 MG PO TABS: 1.0000 | ORAL_TABLET | Freq: Every day | ORAL | 0 refills | Status: DC

## 2017-11-17 MED FILL — AMLODIPINE BESYLATE 10 MG T: 10 | 90 days supply | Qty: 90 | Fill #0

## 2017-11-17 MED FILL — FLUTICASONE PROP 50 MCG SPR: 50 | 30 days supply | Qty: 16 | Fill #0

## 2017-11-17 MED FILL — LOSARTAN-HCTZ 100-25 MG TAB: 100-25 | 30 days supply | Qty: 30 | Fill #0

## 2017-11-17 NOTE — Patient Instructions (Signed)
Try to supplement your meals with Boost/Ensure. I have referred you to gastroenterology for further evaluation of getting full easily.   Try to continue to work on quitting smoking.

## 2017-11-17 NOTE — Progress Notes (Signed)
Patient ID: Lori Bush, female    DOB: 08/07/1960  MRN: [id number]  CC: Hypertension   Subjective: Lori Bush is a 57 y.o. female who presents for chronic ds management. Her concerns today include:  Hx of HTN, hep C on Harvoni through ID Dr. Linus Salmons, tobacco dep.  Colon Ca screening:  Has c-scope scheduled for 01/2018 with [facility].  HTN:  BP readings have been running high at home but out of med x 2-3 days.  Limit salt in the foods.  C/o a lot of nasal congestion, rhinorrhea, itchy eyes and throat and sneezing.  Out of Flonase.  Not taking Zyrtec or Claritin.   C/o clear vaginal discgh intermittently x 2 wks.  Not sexually active for past 6 yrs.  No itching.  Use bath balms when she takes a bath in tub.   Loss 16 lbs over last yr. Eats breakfast and lunch but has early satiety x 1 mth. Eats more when she has company.  No problems swallowing, abdominal pain or changes in bowel habits. No palpitations.  + hot flashes sometimes att nights.  TSH 05/2018 was nl.  HIV test 12/2016 was negative  No feelings of depression or anxiety No chronic cough/SOB.  He is a smoker.  She is down to 4 cigarettes a day.  She has smoked for 32 years, at her highest she was smoking about 1/2-2/3 of a pack a day No polyuria or polydipsia.  Blood sugar on recent chem was normal   Patient Active Problem List   Diagnosis Date Noted  . Tobacco dependence 03/17/2017  . Cyst of skin 03/17/2017  . Primary osteoarthritis of right knee 10/28/2016  . Chronic hepatitis C without hepatic coma ([place]) 03/26/2016  . Chronic right shoulder pain 01/17/2015  . Hypertension 03/05/2013     Current Outpatient Medications on File Prior to Visit  Medication Sig Dispense Refill  . albuterol (PROVENTIL HFA;VENTOLIN HFA) 108 (90 Base) MCG/ACT inhaler Inhale 2 puffs into the lungs every 4 (four) hours as needed for wheezing or shortness of breath. 1 Inhaler 2  . carvedilol (COREG) 6.25 MG tablet Take 0.5 tablets  (3.125 mg total) by mouth 2 (two) times daily with a meal. 90 tablet 5   No current facility-administered medications on file prior to visit.     No Known Allergies  Social History   Socioeconomic History  . Marital status: Single    Spouse name: Not on file  . Number of children: Not on file  . Years of education: Not on file  . Highest education level: Not on file  Occupational History  . Not on file  Social Needs  . Financial resource strain: Not on file  . Food insecurity:    Worry: Not on file    Inability: Not on file  . Transportation needs:    Medical: Not on file    Non-medical: Not on file  Tobacco Use  . Smoking status: Current Every Day Smoker    Packs/day: 0.50    Years: 31.00    Pack years: 15.50    Types: Cigarettes  . Smokeless tobacco: Never Used  . Tobacco comment: states she cut back 3 cigarettes/day  Substance and Sexual Activity  . Alcohol use: No  . Drug use: No  . Sexual activity: Not on file  Lifestyle  . Physical activity:    Days per week: Not on file    Minutes per session: Not on file  . Stress: Not on file  Relationships  . Social connections:    Talks on phone: Not on file    Gets together: Not on file    Attends religious service: Not on file    Active member of club or organization: Not on file    Attends meetings of clubs or organizations: Not on file    Relationship status: Not on file  . Intimate partner violence:    Fear of current or ex partner: Not on file    Emotionally abused: Not on file    Physically abused: Not on file    Forced sexual activity: Not on file  Other Topics Concern  . Not on file  Social History Narrative  . Not on file    Family History  Problem Relation Age of Onset  . Hypertension Mother   . Heart disease Mother   . Hypertension Father   . Dementia Father   . Arthritis Sister   . Hypertension Sister   . Arthritis Brother   . Hypertension Brother     Past Surgical History:  Procedure  Laterality Date  . CESAREAN SECTION    . HERNIA REPAIR     as a child  . WISDOM TOOTH EXTRACTION      ROS: Review of Systems Negative except as stated above PHYSICAL EXAM: BP (!) 143/97   Pulse 79   Temp 98.2 F (36.8 C) (Oral)   Resp 16   Wt 102 lb 3.2 oz (46.4 kg)   SpO2 96%   BMI 19.96 kg/m   Wt Readings from Last 3 Encounters:  11/17/17 102 lb 3.2 oz (46.4 kg)  06/20/17 109 lb (49.4 kg)  05/16/17 110 lb 6.4 oz (50.1 kg)    Physical Exam  General appearance - alert, well appearing, and in no distress Mental status - normal mood, behavior, speech, dress, motor activity, and thought processes Eyes - pupils equal and reactive, extraocular eye movements intact Nose -mild enlargement of nasal turbinates  mouth - mucous membranes moist, pharynx normal without lesions Neck - supple, no significant adenopathy.  No thyroid enlargement Lymphatics -no cervical or axillary lymphadenopathy Chest - clear to auscultation, no wheezes, rales or rhonchi, symmetric air entry Heart - normal rate, regular rhythm, normal S1, S2, no murmurs, rubs, clicks or gallops Abdomen - soft, nontender, nondistended, no masses or organomegaly Extremities - peripheral pulses normal, no pedal edema, no clubbing or cyanosis    Chemistry      Component Value Date/Time   NA 138 09/16/2017 0913   K 3.7 09/16/2017 0913   CL 106 09/16/2017 0913   CO2 25 09/16/2017 0913   BUN 16 09/16/2017 0913   CREATININE 0.84 09/16/2017 0913      Component Value Date/Time   CALCIUM 10.1 09/16/2017 0913   ALKPHOS 70 12/23/2016 1413   AST 16 09/16/2017 0913   ALT 9 09/16/2017 0913   ALT 39 (H) 12/23/2016 1413   BILITOT 0.6 09/16/2017 0913      ASSESSMENT AND PLAN: 1. Essential hypertension Not at goal.  Refills sent on blood pressure medicines today - losartan-hydrochlorothiazide (HYZAAR) 100-25 MG tablet; Take 1 tablet by mouth daily.  Dispense: 30 tablet; Refill: 0 - amLODipine (NORVASC) 10 MG tablet; Take  1 tablet (10 mg total) by mouth daily.  Dispense: 90 tablet; Refill: 3  2. Seasonal allergic rhinitis due to pollen - fluticasone (FLONASE) 50 MCG/ACT nasal spray; Place 2 sprays into both nostrils at bedtime.  Dispense: 16 g; Refill: 2 - loratadine (CLARITIN) 10 MG  tablet; Take 1 tablet (10 mg total) by mouth daily.  Dispense: 30 tablet; Refill: 11  3. Weight loss 4. Early satiety I have sent her to gastroenterology to see whether they would be willing to do an EGD with the colonoscopy given her symptoms of early satiety. Her orange card has expired.  I have encouraged her to reapply ASAP We could do CAT scan of the chest, abdomen and pelvis but would like for her to have the GI work-up done first before considering this move - TSH - CBC - Ambulatory referral to Gastroenterology  5. Vaginal discharge - Cervicovaginal ancillary only  6. Tobacco dependence Commended on cutting back.  Encourage her to quit  Patient was given the opportunity to ask questions.  Patient verbalized understanding of the plan and was able to repeat key elements of the plan.   Orders Placed This Encounter  Procedures  . TSH  . CBC  . Ambulatory referral to Gastroenterology     Requested Prescriptions   Signed Prescriptions Disp Refills  . fluticasone (FLONASE) 50 MCG/ACT nasal spray 16 g 2    Sig: Place 2 sprays into both nostrils at bedtime.  Marland Kitchen amLODipine (NORVASC) 10 MG tablet 90 tablet 3    Sig: Take 1 tablet (10 mg total) by mouth daily.  Marland Kitchen loratadine (CLARITIN) 10 MG tablet 30 tablet 11    Sig: Take 1 tablet (10 mg total) by mouth daily.  Marland Kitchen losartan-hydrochlorothiazide (HYZAAR) 100-25 MG tablet 30 tablet 6    Sig: Take 1 tablet by mouth daily.    No follow-ups on file.  Karle Plumber, MD, FACP

## 2017-11-18 ENCOUNTER — Encounter: Payer: Self-pay | Admitting: Gastroenterology

## 2017-11-18 LAB — CBC
HEMOGLOBIN: 14.2 g/dL (ref 11.1–15.9)
Hematocrit: 42.4 % (ref 34.0–46.6)
MCH: 29.5 pg (ref 26.6–33.0)
MCHC: 33.5 g/dL (ref 31.5–35.7)
MCV: 88 fL (ref 79–97)
PLATELETS: 282 10*3/uL (ref 150–379)
RBC: 4.81 x10E6/uL (ref 3.77–5.28)
RDW: 13.4 % (ref 12.3–15.4)
WBC: 9.3 10*3/uL (ref 3.4–10.8)

## 2017-11-18 LAB — CERVICOVAGINAL ANCILLARY ONLY
BACTERIAL VAGINITIS: NEGATIVE
CANDIDA VAGINITIS: NEGATIVE

## 2017-11-18 LAB — TSH: TSH: 2.66 u[IU]/mL (ref 0.450–4.500)

## 2017-11-24 ENCOUNTER — Ambulatory Visit: Payer: No Typology Code available for payment source | Attending: Internal Medicine

## 2017-12-05 ENCOUNTER — Ambulatory Visit: Payer: Self-pay | Attending: Family Medicine

## 2018-01-05 MED FILL — CARVEDILOL 6.25 MG TABLET: 6.25 | 60 days supply | Qty: 60 | Fill #3

## 2018-01-13 ENCOUNTER — Encounter: Payer: No Typology Code available for payment source | Admitting: Gastroenterology

## 2018-01-14 ENCOUNTER — Ambulatory Visit (INDEPENDENT_AMBULATORY_CARE_PROVIDER_SITE_OTHER): Payer: Self-pay | Admitting: Gastroenterology

## 2018-01-14 ENCOUNTER — Ambulatory Visit (INDEPENDENT_AMBULATORY_CARE_PROVIDER_SITE_OTHER)
Admission: RE | Admit: 2018-01-14 | Discharge: 2018-01-14 | Disposition: A | Discharge status: Home / Self Care | Payer: Self-pay | Source: Ambulatory Visit | Attending: Gastroenterology | Admitting: Gastroenterology

## 2018-01-14 ENCOUNTER — Encounter: Payer: Self-pay | Admitting: Gastroenterology

## 2018-01-14 VITALS — BP 144/90 | HR 72 | Ht 60.25 in | Wt 103.0 lb

## 2018-01-14 DIAGNOSIS — F17200 Nicotine dependence, unspecified, uncomplicated: Secondary | ICD-10-CM

## 2018-01-14 DIAGNOSIS — R6881 Early satiety: Secondary | ICD-10-CM

## 2018-01-14 DIAGNOSIS — R634 Abnormal weight loss: Secondary | ICD-10-CM

## 2018-01-14 MED ORDER — NA SULFATE-K SULFATE-MG SULF 17.5-3.13-1.6 GM/177ML PO SOLN: 1.0000 | Freq: Once | ORAL | 0 refills | Status: AC

## 2018-01-14 NOTE — Patient Instructions (Signed)
If you are age 57 or older, your body mass index should be between 23-30. Your Body mass index is 19.95 kg/m. If this is out of the aforementioned range listed, please consider follow up with your Primary Care Provider.  If you are age 21 or younger, your body mass index should be between 19-25. Your Body mass index is 19.95 kg/m. If this is out of the aformentioned range listed, please consider follow up with your Primary Care Provider.   Please go to the basement level for a chest xray.  It was a pleasure to meet you today!  Dr. Loletha Carrow

## 2018-01-14 NOTE — Progress Notes (Signed)
[facility] Consult Note:  History: Lori Bush 01/14/2018  Referring physician: Ladell Pier, MD  Reason for consult/chief complaint: early satiety and Weight Loss (having night sweats that she attributes to weight loss on her home scale she weighted 97.8, 18 lbs in 2-3 months)   Subjective  HPI:  Lori Bush [facility], originally for a screening colonoscopy, then more recently for about a year of early satiety with an 18 pound weight loss.  She reports that for at least the last several months she has had early satiety with intermittent nausea but no vomiting.  She has had several episodes of drenching night sweats that she cannot explain.  Her bowel habits have been regular without rectal bleeding.  She denies dysphagia or anorexia.  Previous endoscopic work-up for iron deficiency anemia but the [facility] in 2008 noted below. ROS:  Review of Systems  Constitutional: Negative for appetite change and unexpected weight change.  HENT: Negative for mouth sores and voice change.   Eyes: Negative for pain and redness.  Respiratory: Positive for cough. Negative for shortness of breath.        Denies hemoptysis  Cardiovascular: Negative for chest pain and palpitations.  Genitourinary: Negative for dysuria and hematuria.  Musculoskeletal: Negative for arthralgias and myalgias.  Skin: Negative for pallor and rash.  Neurological: Negative for weakness and headaches.  Hematological: Negative for adenopathy.     Past Medical History: Past Medical History:  Diagnosis Date  . Arthritis Dx 2012  . Hypertension Dx 2005     Past Surgical History: Past Surgical History:  Procedure Laterality Date  . CESAREAN SECTION    . COLONOSCOPY WITH ESOPHAGOGASTRODUODENOSCOPY (EGD)  03/2007  . [place]   as a child  . WISDOM TOOTH EXTRACTION       Family History: Family History  Problem Relation  Age of Onset  . Hypertension Mother   . Heart disease Mother   . Hypertension Father   . Dementia Father   . Arthritis Sister   . Hypertension Sister   . Arthritis Brother   . Hypertension Brother     Social History: Social History   Socioeconomic History  . Marital status: Single    Spouse name: Not on file  . Number of children: 5  . Years of education: Not on file  . Highest education level: Not on file  Occupational History  . Occupation: Publishing copy  Social Needs  . Financial resource strain: Not on file  . Food insecurity:    Worry: Not on file    Inability: Not on file  . Transportation needs:    Medical: Not on file    Non-medical: Not on file  Tobacco Use  . Smoking status: Current Every Day Smoker    Packs/day: 0.50    Years: 31.00    Pack years: 15.50    Types: Cigarettes  . Smokeless tobacco: Never Used  . Tobacco comment: states she cut back 3 cigarettes/day  Substance and Sexual Activity  . Alcohol use: No  . Drug use: No  . Sexual activity: Not on file  Lifestyle  . Physical activity:    Days per week: Not on file    Minutes per session: Not on file  . Stress: Not on file  Relationships  . Social connections:    Talks on phone: Not on file    Gets together: Not on file    Attends religious  service: Not on file    Active member of club or organization: Not on file    Attends meetings of clubs or organizations: Not on file    Relationship status: Not on file  Other Topics Concern  . Not on file  Social History Narrative  . Not on file   Operates a catering service  Allergies: No Known Allergies  Outpatient Meds: Current Outpatient Medications  Medication Sig Dispense Refill  . albuterol (PROVENTIL HFA;VENTOLIN HFA) 108 (90 Base) MCG/ACT inhaler Inhale 2 puffs into the lungs every 4 (four) hours as needed for wheezing or shortness of breath. 1 Inhaler 2  . amLODipine (NORVASC) 10 MG tablet Take 1 tablet (10 mg total) by mouth  daily. 90 tablet 3  . carvedilol (COREG) 6.25 MG tablet Take 0.5 tablets (3.125 mg total) by mouth 2 (two) times daily with a meal. 90 tablet 5  . fluticasone (FLONASE) 50 MCG/ACT nasal spray Place 2 sprays into both nostrils at bedtime. 16 g 2  . loratadine (CLARITIN) 10 MG tablet Take 1 tablet (10 mg total) by mouth daily. 30 tablet 11  . losartan-hydrochlorothiazide (HYZAAR) 100-25 MG tablet Take 1 tablet by mouth daily. 30 tablet 6  . Na Sulfate-K Sulfate-Mg Sulf 17.5-3.13-1.6 GM/177ML SOLN Take 1 kit by mouth once for 1 dose. 354 mL 0   No current facility-administered medications for this visit.       ___________________________________________________________________ Objective   Exam:  BP (!) 144/90 (BP Location: Left Arm, Patient Position: Sitting, Cuff Size: Normal)   Pulse 72   Ht 5' 0.25" (1.53 m) Comment: height measured without shoes  Wt 103 lb (46.7 kg)   BMI 19.95 kg/m    General: this is a(n) she is alert, conversational, pleasant with normal vocal quality.  She has poor muscle mass with sunken temples.  Eyes: sclera anicteric, no redness  ENT: oral mucosa moist without lesions, no cervical or supraclavicular lymphadenopathy, poor dentition  CV: RRR without murmur, S1/S2, no JVD, no peripheral edema  Resp: clear to auscultation bilaterally, normal RR and effort noted  GI: soft, no tenderness, with active bowel sounds. No guarding or palpable organomegaly noted.  No distention   skin; warm and dry, no rash or jaundice noted  Neuro: awake, alert and oriented x 3. Normal gross motor function and fluent speech  Labs:  CMP Latest Ref Rng & Units 09/16/2017 12/23/2016 12/23/2016  Glucose 65 - 99 mg/dL 90 86 -  BUN 7 - 25 mg/dL 16 12 -  Creatinine 0.50 - 1.05 mg/dL 0.84 0.82 -  Sodium 135 - 146 mmol/L 138 141 -  Potassium 3.5 - 5.3 mmol/L 3.7 3.6 -  Chloride 98 - 110 mmol/L 106 107 -  CO2 20 - 32 mmol/L 25 25 -  Calcium 8.6 - 10.4 mg/dL 10.1 10.1 -  Total  Protein 6.1 - 8.1 g/dL 6.7 6.5 -  Total Bilirubin 0.2 - 1.2 mg/dL 0.6 0.5 -  Alkaline Phos 33 - 130 U/L - 70 -  AST 10 - 35 U/L 16 28 -  ALT 6 - 29 U/L 9 39(H) 39(H)   CBC Latest Ref Rng & Units 11/17/2017 12/23/2016 04/16/2014  WBC 3.4 - 10.8 x10E3/uL 9.3 7.9 6.2  Hemoglobin 11.1 - 15.9 g/dL 14.2 15.2 13.6  Hematocrit 34.0 - 46.6 % 42.4 44.5 39.6  Platelets 150 - 379 x10E3/uL 282 269 257   Normal TSH 11/17/17  Radiologic Studies:  EGD/colon [facility] at [facility] 03/2007 - normal colon Gastric erythema -  CLO test done, no result included with records.   [place] with elastography 01/2017 - gallstones and F2/F3 fibrosis  Assessment: Encounter Diagnoses  Name Primary?  . Early satiety Yes  . Abnormal loss of weight   . Smoker     Symptoms of unclear cause, presumably the cause of her weight loss but not certain.  Consider peptic ulcer, gastritis, malignancy.  She is also a smoker and has not had a chest x-ray in a long time.  Plan:  PA and lateral chest x-ray today EGD and colonoscopy soon.  Procedures were described in details and she is agreeable after discussion of risks and benefits. If those studies are unremarkable, we will plan CT scan chest abdomen pelvis  Thank you for the courtesy of this consult.  Please call me with any questions or concerns.  Nelida Meuse III  CC: Ladell Pier, MD

## 2018-01-21 MED FILL — LOSARTAN-HCTZ 100-25 MG TAB: 100-25 | 30 days supply | Qty: 30 | Fill #0

## 2018-02-04 ENCOUNTER — Encounter: Payer: Self-pay | Admitting: Gastroenterology

## 2018-02-04 ENCOUNTER — Ambulatory Visit (AMBULATORY_SURGERY_CENTER): Payer: Self-pay | Admitting: Gastroenterology

## 2018-02-04 ENCOUNTER — Telehealth: Payer: Self-pay | Admitting: Gastroenterology

## 2018-02-04 VITALS — BP 149/90 | HR 57 | Temp 98.0°F | Resp 18 | Ht 60.25 in | Wt 103.0 lb

## 2018-02-04 DIAGNOSIS — B9681 Helicobacter pylori [H. pylori] as the cause of diseases classified elsewhere: Secondary | ICD-10-CM

## 2018-02-04 DIAGNOSIS — R6881 Early satiety: Secondary | ICD-10-CM

## 2018-02-04 DIAGNOSIS — K3189 Other diseases of stomach and duodenum: Secondary | ICD-10-CM

## 2018-02-04 DIAGNOSIS — R634 Abnormal weight loss: Secondary | ICD-10-CM

## 2018-02-04 DIAGNOSIS — K2970 Gastritis, unspecified, without bleeding: Secondary | ICD-10-CM

## 2018-02-04 DIAGNOSIS — K2950 Unspecified chronic gastritis without bleeding: Secondary | ICD-10-CM

## 2018-02-04 MED ORDER — SODIUM CHLORIDE 0.9 % IV SOLN: 500.0000 mL | Freq: Once | INTRAVENOUS | Status: DC

## 2018-02-04 NOTE — Op Note (Signed)
[place] Patient Name: Lori Bush Procedure Date: 02/04/2018 10:08 AM MRN: [id number] Endoscopist: [place]. Loletha Carrow , MD Age: 57 Referring MD:  Date of Birth: [birth year] Gender: Female Account #: [id number] Procedure:                Upper GI endoscopy Indications:              Early satiety, Weight loss (smoker) Medicines:                Monitored Anesthesia Care Procedure:                Pre-Anesthesia Assessment:                           - Prior to the procedure, a History and Physical                            was performed, and patient medications and                            allergies were reviewed. The patient's tolerance of                            previous anesthesia was also reviewed. The risks                            and benefits of the procedure and the sedation                            options and risks were discussed with the patient.                            All questions were answered, and informed consent                            was obtained. Prior Anticoagulants: The patient has                            taken no previous anticoagulant or antiplatelet                            agents. ASA Grade Assessment: II - A patient with                            mild systemic disease. After reviewing the risks                            and benefits, the patient was deemed in                            satisfactory condition to undergo the procedure.                           After obtaining informed consent, the endoscope was  passed under direct vision. Throughout the                            procedure, the patient's blood pressure, pulse, and                            oxygen saturations were monitored continuously. The                            Endoscope was introduced through the mouth, and                            advanced to the second part of duodenum. The upper                            GI endoscopy  was accomplished without difficulty.                            The patient tolerated the procedure well. Scope In: Scope Out: Findings:                 The esophagus was normal.                           Diffuse inflammation characterized by congestion                            (edema) and erythema was found in the entire                            examined stomach. Biopsies were taken with a cold                            forceps for histology. (Sidney protocol).(Jar 3)                           Two 4 to 5 mm sessile polyps were found on the                            anterior wall of the gastric antrum and on the                            posterior wall of the gastric antrum. Biopsies were                            taken with a cold forceps for histology. (Anterior,                            Jar 1; Posterior Jar 2)                           The cardia and gastric fundus were normal on  retroflexion.                           The exam of the stomach was otherwise normal.                           The examined duodenum was normal. Complications:            No immediate complications. Estimated Blood Loss:     Estimated blood loss: none. Estimated blood loss                            was minimal. Impression:               - Normal esophagus.                           - Chronic gastritis. Biopsied.                           - Two gastric polyps. Biopsied.                           - Normal examined duodenum. Recommendation:           - Patient has a contact number available for                            emergencies. The signs and symptoms of potential                            delayed complications were discussed with the                            patient. Return to normal activities tomorrow.                            Written discharge instructions were provided to the                            patient.                           - Resume previous  diet.                           - Continue present medications.                           - Await pathology results.                           - See the other procedure note for documentation of                            additional recommendations.  L. Loletha Carrow, MD 02/04/2018 10:41:12 AM This report has been signed electronically.

## 2018-02-04 NOTE — Patient Instructions (Signed)
Handouts given on gastritis.  2 bottles of contrast dye with blank instructions given to care partner.  Dr Loletha Carrow' nurse will schedule CT and call pt.  YOU HAD AN ENDOSCOPIC PROCEDURE TODAY AT [facility]:   Refer to the procedure report that was given to you for any specific questions about what was found during the examination.  If the procedure report does not answer your questions, please call your gastroenterologist to clarify.  If you requested that your care partner not be given the details of your procedure findings, then the procedure report has been included in a sealed envelope for you to review at your convenience later.  YOU SHOULD EXPECT: Some feelings of bloating in the abdomen. Passage of more gas than usual.  Walking can help get rid of the air that was put into your GI tract during the procedure and reduce the bloating. If you had a lower endoscopy (such as a colonoscopy or flexible sigmoidoscopy) you may notice spotting of blood in your stool or on the toilet paper. If you underwent a bowel prep for your procedure, you may not have a normal bowel movement for a few days.  Please Note:  You might notice some irritation and congestion in your nose or some drainage.  This is from the oxygen used during your procedure.  There is no need for concern and it should clear up in a day or so.  SYMPTOMS TO REPORT IMMEDIATELY:   Following lower endoscopy (colonoscopy or flexible sigmoidoscopy):  Excessive amounts of blood in the stool  Significant tenderness or worsening of abdominal pains  Swelling of the abdomen that is new, acute  Fever of 100F or higher   Following upper endoscopy (EGD)  Vomiting of blood or coffee ground material  New chest pain or pain under the shoulder blades  Painful or persistently difficult swallowing  New shortness of breath  Fever of 100F or higher  Black, tarry-looking stools  For urgent or emergent issues, a gastroenterologist can be  reached at any hour by calling [phone].   DIET:  We do recommend a small meal at first, but then you may proceed to your regular diet.  Drink plenty of fluids but you should avoid alcoholic beverages for 24 hours.  ACTIVITY:  You should plan to take it easy for the rest of today and you should NOT DRIVE or use heavy machinery until tomorrow (because of the sedation medicines used during the test).    FOLLOW UP: Our staff will call the number listed on your records the next business day following your procedure to check on you and address any questions or concerns that you may have regarding the information given to you following your procedure. If we do not reach you, we will leave a message.  However, if you are feeling well and you are not experiencing any problems, there is no need to return our call.  We will assume that you have returned to your regular daily activities without incident.  If any biopsies were taken you will be contacted by phone or by letter within the next 1-3 weeks.  Please call us at [phone] if you have not heard about the biopsies in 3 weeks.    SIGNATURES/CONFIDENTIALITY: You and/or your care partner have signed paperwork which will be entered into your electronic medical record.  These signatures attest to the fact that that the information above on your After Visit Summary has been reviewed and is understood.  Full responsibility of the confidentiality of this discharge information lies with you and/or your care-partner.

## 2018-02-04 NOTE — Telephone Encounter (Signed)
Please schedule CT scan chest,abdomen and pelvis with oral and IV contrast for: weight loss, smoker, early satiety, no source found on EGD and colonoscopy.

## 2018-02-04 NOTE — Op Note (Signed)
[place] Patient Name: Lori Bush Procedure Date: 02/04/2018 10:07 AM MRN: [id number] Endoscopist: Mallie Mussel L. Loletha Carrow , MD Age: 57 Referring MD:  Date of Birth: 1961/02/17 Gender: Female Account #: [id number] Procedure:                Colonoscopy Indications:              Weight loss Medicines:                Monitored Anesthesia Care Procedure:                Pre-Anesthesia Assessment:                           - Prior to the procedure, a History and Physical                            was performed, and patient medications and                            allergies were reviewed. The patient's tolerance of                            previous anesthesia was also reviewed. The risks                            and benefits of the procedure and the sedation                            options and risks were discussed with the patient.                            All questions were answered, and informed consent                            was obtained. Prior Anticoagulants: The patient has                            taken no previous anticoagulant or antiplatelet                            agents. ASA Grade Assessment: II - A patient with                            mild systemic disease. After reviewing the risks                            and benefits, the patient was deemed in                            satisfactory condition to undergo the procedure.                           After obtaining informed consent, the colonoscope  was passed under direct vision. Throughout the                            procedure, the patient's blood pressure, pulse, and                            oxygen saturations were monitored continuously. The                            Colonoscope was introduced through the anus and                            advanced to the the cecum, identified by the                            ileocecal valve. The colonoscopy was performed                             without difficulty. The patient tolerated the                            procedure well. The quality of the bowel                            preparation was poor, with very little of the                            colonic mucosa visualized due to retained liquid                            and semi-formed stool. The ileocecal valve and the                            rectum were photographed. The appendiceal orifice                            and ceal base could not be visualized or                            photographed because they were obscured by poor                            preparation. Scope In: 10:26:29 AM Scope Out: 10:34:47 AM Scope Withdrawal Time: 0 hours 5 minutes 10 seconds  Total Procedure Duration: 0 hours 8 minutes 18 seconds  Findings:                 The digital rectal exam findings include decreased                            sphincter tone.                           Extensive amounts of stool was found in the entire  colon, precluding visualization.                           Retroflexion in the rectum was not performed due to                            anatomy. Complications:            No immediate complications. Estimated Blood Loss:     Estimated blood loss: none. Impression:               - Preparation of the colon was poor.                           - Decreased sphincter tone found on digital rectal                            exam.                           - Stool in the entire examined colon.                           - No specimens collected. Recommendation:           - Patient has a contact number available for                            emergencies. The signs and symptoms of potential                            delayed complications were discussed with the                            patient. Return to normal activities tomorrow.                            Written discharge instructions were provided to  the                            patient.                           - Resume previous diet.                           - Continue present medications.                           - Repeat colonoscopy after studies are complete.                            Timing to be determined.                           - Perform a CT scan (computed tomography) of chest  with contrast, abdomen with contrast and pelvis                            with contrast at appointment to be scheduled. Naquita Nappier L. Loletha Carrow, MD 02/04/2018 10:48:16 AM This report has been signed electronically.

## 2018-02-04 NOTE — Progress Notes (Signed)
A/ox3, pleased with MAC, report to RN 

## 2018-02-04 NOTE — Progress Notes (Signed)
Called to room to assist during endoscopic procedure.  Patient ID and intended procedure confirmed with present staff. Received instructions for my participation in the procedure from the performing physician.  

## 2018-02-04 NOTE — Progress Notes (Signed)
Pt's states no medical or surgical changes since previsit or office visit. 

## 2018-02-05 ENCOUNTER — Telehealth: Payer: Self-pay

## 2018-02-05 ENCOUNTER — Other Ambulatory Visit: Payer: Self-pay

## 2018-02-05 DIAGNOSIS — F17200 Nicotine dependence, unspecified, uncomplicated: Secondary | ICD-10-CM

## 2018-02-05 DIAGNOSIS — R634 Abnormal weight loss: Secondary | ICD-10-CM

## 2018-02-05 DIAGNOSIS — R6881 Early satiety: Secondary | ICD-10-CM

## 2018-02-05 NOTE — Telephone Encounter (Signed)
Called [facility] and they had an opening for tomorrow. Lori Bush will contact patient to see if the works for her schedule. Patient already has contrast.

## 2018-02-05 NOTE — Telephone Encounter (Signed)
  Follow up Call-  Call back number 02/04/2018  Post procedure Call Back phone  # [phone]  Permission to leave phone message Yes  Some recent data might be hidden     Patient questions:  Do you have a fever, pain , or abdominal swelling? No. Pain Score  0 *  Have you tolerated food without any problems? Yes.    Have you been able to return to your normal activities? Yes.    Do you have any questions about your discharge instructions: Diet   No. Medications  No. Follow up visit  No.  Do you have questions or concerns about your Care? No.  Actions: * If pain score is 4 or above: No action needed, pain <4.

## 2018-02-06 ENCOUNTER — Ambulatory Visit (INDEPENDENT_AMBULATORY_CARE_PROVIDER_SITE_OTHER)
Admission: RE | Admit: 2018-02-06 | Discharge: 2018-02-06 | Disposition: A | Discharge status: Home / Self Care | Payer: Self-pay | Source: Ambulatory Visit | Attending: Gastroenterology | Admitting: Gastroenterology

## 2018-02-06 DIAGNOSIS — R634 Abnormal weight loss: Secondary | ICD-10-CM

## 2018-02-06 DIAGNOSIS — R6881 Early satiety: Secondary | ICD-10-CM

## 2018-02-06 DIAGNOSIS — F17200 Nicotine dependence, unspecified, uncomplicated: Secondary | ICD-10-CM

## 2018-02-06 MED ORDER — IOPAMIDOL (ISOVUE-300) INJECTION 61%
80.0000 mL | Freq: Once | INTRAVENOUS | Status: AC | PRN
  Administered 2018-02-06: 80 mL via INTRAVENOUS

## 2018-02-06 MED ORDER — IOPAMIDOL (ISOVUE-300) INJECTION 61%
100.0000 mL | Freq: Once | INTRAVENOUS | Status: AC | PRN
  Administered 2018-02-06: 80 mL via INTRAVENOUS

## 2018-02-10 ENCOUNTER — Other Ambulatory Visit: Payer: Self-pay

## 2018-02-10 DIAGNOSIS — A048 Other specified bacterial intestinal infections: Secondary | ICD-10-CM

## 2018-02-10 MED ORDER — DOXYCYCLINE HYCLATE 100 MG PO CAPS: 100.0000 mg | ORAL_CAPSULE | Freq: Two times a day (BID) | ORAL | 0 refills | Status: DC

## 2018-02-10 MED ORDER — BISMUTH SUBSALICYLATE 262 MG PO CHEW: 524.0000 mg | CHEWABLE_TABLET | Freq: Four times a day (QID) | ORAL | 0 refills | Status: DC

## 2018-02-10 MED ORDER — METRONIDAZOLE 250 MG PO TABS: 250.0000 mg | ORAL_TABLET | Freq: Four times a day (QID) | ORAL | 0 refills | Status: DC

## 2018-02-10 MED FILL — metroNIDAZOLE 500 MG TABS: 500 | 10 days supply | Qty: 80 | Fill #0

## 2018-02-10 MED FILL — DOXYCYCLINE HYCLATE 100 MG: 100 | 10 days supply | Qty: 20 | Fill #0

## 2018-02-16 ENCOUNTER — Telehealth: Payer: Self-pay | Admitting: Internal Medicine

## 2018-02-16 NOTE — Telephone Encounter (Signed)
-----   Message from [place], MD sent at 02/10/2018  8:03 AM EDT ----- Almyra Free,  1) CT scan chest/abdomen/pelvis does not show an apparent cause for weight loss. 2 small lung nodules.  I will copy this to her PCP in medicine clinic so they can place a recall to repeat CT chest in 12 months (per radiologist recommendation) to follow up on these. 2) There is also a small growth on the left kidney.  These are commonly seen on CT scan.  Radiologist recommends abdominal MRI (pre-and post contrast-enhanced) to better evaluate this.  3) Stomach biopsy shows the h pylori bacteria, which may be causing some of her digestive symptoms.  I do not think it can be causing the night sweats or weight loss, and she must continue to follow up with primary care about those symptoms. The bacteria needs treatment as follows:  Metronidazole 250 mg four times daily x 10 days Bismuth subsalicylate 748 mg four times daily x 10 days Doxycycline 100 mg twice daily x 10 days Omeprazole 20 mg twice daily x 10 days (available over the counter)  4) Needs urea breath test 2 weeks after completing therapy.  5) She needs to be rescheduled for a colonoscopy due to poor bowel preparation.  Indication:  Weight loss.  Needs 2-DAY PREP  Dr. Wynetta Emery,    Please see above for items that require your attention.   - [facility]

## 2018-02-17 ENCOUNTER — Telehealth: Payer: Self-pay | Admitting: Gastroenterology

## 2018-02-17 NOTE — Telephone Encounter (Signed)
Left message that I was returning her call.

## 2018-02-17 NOTE — Telephone Encounter (Signed)
Pt returned your call and would like you to try again please.

## 2018-02-17 NOTE — Telephone Encounter (Signed)
Spoke to patient, she states her pharmacy gave her flagyl 500 mg QID. Went back over her orders and she had the letter to compare, made sure that she will only take flagyl 250 mg QID, she will cut tablet in half. If she has further questions understands to contact our office.

## 2018-02-17 NOTE — Telephone Encounter (Signed)
Contacted pt to go over Dr. Johnson response pt didn't answer and was unable to lvm  

## 2018-02-18 ENCOUNTER — Telehealth: Payer: Self-pay | Admitting: Gastroenterology

## 2018-02-18 NOTE — Telephone Encounter (Signed)
Contacted pt to inform her Dr. Wynetta Emery response. Pt is aware. Pt states when she came and picked up her rx's her Metronidazole was mislabel. Pt states she received the medication with a quantity of 80 of metronidazole 500mg . Pt stated she spoke with Thomasena Edis the pharmacist and he apologized for the mislabel. Thomasena Edis explained to pt that she can take 1/2 tab of the 500mg  qid for 10 days. Pt stated she didn't feel comfortable taking the rx and if she should go to the ER. I informed pt that I will go to the pharmacy and speak with Thomasena Edis to see what happened and I will give her a call back.  Went to the pharmacy to speak with Thomasena Edis and per Thomasena Edis pt picked up rx's on 02/13/2018 and he did speak with pt regarding the medication and gave her the right directions to take the medication. Thomasena Edis informed me that he offered pt to bring back medication and will put the right label on it and pt declined.   Dr. Wynetta Emery what would you like pt to do?

## 2018-02-18 NOTE — Telephone Encounter (Signed)
Routed to DOD, Dr. Loletha Bush patient, patient was given wrong dosage of flagyl from her pharmacy, given Flagyl 500 mg QID, took that 1 1/2 days ago. Went over instructions with her yesterday on how to take it. She reports that even today she is feeling "gittery, some shortness of breath" and does not want to take any more flagyl. She is taking the other medications prescribed to treat H pylori (doxycycline, bismuth, OTC omeprazole), please advise. I did let her know that if she is feeling worse then go to UC or ED for evaluation, but to not take any more Flagyl.

## 2018-02-19 NOTE — Telephone Encounter (Signed)
Contacted pt to go over Dr. Wynetta Emery response. Pt states took 4,000mg  on Monday. I went to talk with Thomasena Edis about pt taking 4,00mg . Per Thomasena Edis pt can take 1/2 tab qid for 9 days. I made pt aware of this information pt states she understands and she will start taking the medication the way Ethan explained. Pt doesn't have any questions or concerns

## 2018-02-19 NOTE — Telephone Encounter (Signed)
Routed to Dr. Danis. 

## 2018-02-19 NOTE — Telephone Encounter (Signed)
Not clear that it is a reaction to the flagyl.  H pylori is a difficult infection to treat, and an incomplete regimen may not clear the bacteria and could lead to resistant h pylori.    If she still has the flagyl, I would like her to take it at the dose I prescribed, which is 250 mg four times daily for the duration of the course.  She can take half of a 500 mg tablet to make that dose.

## 2018-02-19 NOTE — Telephone Encounter (Signed)
Left detailed message for patient to continue taking her flagyl 250 mg QID as prescribed. I will also send her a MyChart message.

## 2018-02-27 ENCOUNTER — Encounter: Payer: Self-pay | Admitting: Internal Medicine

## 2018-02-27 ENCOUNTER — Telehealth: Payer: Self-pay

## 2018-02-27 ENCOUNTER — Ambulatory Visit: Payer: Self-pay | Attending: Internal Medicine | Admitting: Internal Medicine

## 2018-02-27 VITALS — BP 177/120 | HR 69 | Temp 97.8°F | Resp 16 | Wt 102.6 lb

## 2018-02-27 DIAGNOSIS — A048 Other specified bacterial intestinal infections: Secondary | ICD-10-CM

## 2018-02-27 DIAGNOSIS — I10 Essential (primary) hypertension: Secondary | ICD-10-CM

## 2018-02-27 DIAGNOSIS — N2889 Other specified disorders of kidney and ureter: Secondary | ICD-10-CM

## 2018-02-27 DIAGNOSIS — B9681 Helicobacter pylori [H. pylori] as the cause of diseases classified elsewhere: Secondary | ICD-10-CM | POA: Insufficient documentation

## 2018-02-27 DIAGNOSIS — M1711 Unilateral primary osteoarthritis, right knee: Secondary | ICD-10-CM | POA: Insufficient documentation

## 2018-02-27 DIAGNOSIS — F17200 Nicotine dependence, unspecified, uncomplicated: Secondary | ICD-10-CM

## 2018-02-27 DIAGNOSIS — R918 Other nonspecific abnormal finding of lung field: Secondary | ICD-10-CM

## 2018-02-27 DIAGNOSIS — R002 Palpitations: Secondary | ICD-10-CM

## 2018-02-27 DIAGNOSIS — F17210 Nicotine dependence, cigarettes, uncomplicated: Secondary | ICD-10-CM | POA: Insufficient documentation

## 2018-02-27 DIAGNOSIS — D1779 Benign lipomatous neoplasm of other sites: Secondary | ICD-10-CM

## 2018-02-27 MED ORDER — CLARITHROMYCIN 500 MG PO TABS: 500.0000 mg | ORAL_TABLET | Freq: Two times a day (BID) | ORAL | 0 refills | Status: DC

## 2018-02-27 MED ORDER — AMOXICILLIN 500 MG PO CAPS: 1000.0000 mg | ORAL_CAPSULE | Freq: Two times a day (BID) | ORAL | 0 refills | Status: DC

## 2018-02-27 MED ORDER — HYDRALAZINE HCL 25 MG PO TABS: 25.0000 mg | ORAL_TABLET | Freq: Two times a day (BID) | ORAL | 3 refills | Status: DC

## 2018-02-27 MED ORDER — OMEPRAZOLE 20 MG PO CPDR: 20.0000 mg | DELAYED_RELEASE_CAPSULE | Freq: Two times a day (BID) | ORAL | 0 refills | Status: DC

## 2018-02-27 MED FILL — hydrALAZINE HCL 25 MG TABS: 25 | 30 days supply | Qty: 60 | Fill #0

## 2018-02-27 MED FILL — CLARITHROMYCIN 500 MG TAB: 500 | 10 days supply | Qty: 20 | Fill #0

## 2018-02-27 MED FILL — AMOXICILLIN 500 MG CAPSULE: 500 | 10 days supply | Qty: 40 | Fill #0

## 2018-02-27 MED FILL — OMEPRAZOLE 20 MG CAP: 20 | 10 days supply | Qty: 20 | Fill #0

## 2018-02-27 NOTE — Patient Instructions (Signed)
Give appointment with Lurena Joiner in 1 month for recheck blood pressure.  Stop the current meds you are taking for H.pylori.   Start Hydralazine 25 mg twice a day for your blood pressure.   You will need a repeat CAT scan of your chest in 1 year.  We will oder MRI of abdomen to evaluate the mass in your left kidney.

## 2018-02-27 NOTE — Telephone Encounter (Signed)
Contacted pt to go over MRI appointment information with pt  MRI is scheduled for March 07, 2018 @1pm  @[facility]. Pt is to arrive @1245pm . Pt is to be NPO 4 hours prior.  Pt is aware of appointment and doesn't have any questions or concerns

## 2018-02-27 NOTE — Progress Notes (Signed)
Patient ID: Lori Bush, female    DOB: 04/26/1961  MRN: [id number]  CC: Follow-up   Subjective: Lori Bush is a 57 y.o. female who presents for chronic ds management. Her concerns today include: Hx of HTN, hep C cured with Harvoni, tobacco dep, wgh loss.  Weight loss: Since last visit patient has been evaluated by GI, Dr. Loletha Carrow.  C- scope needs to be rescheduled due to poor prep.  EGD revealed inflammation of the gastric mucosa with pathology showing chronic active gastritis and H. pylori positive.  CT of the abdomen revealed left kidney nodule of 1.3 cm.  Abdominal MRI recommended for further evaluation.  CT of the chest revealed two 3 mm left upper lobe nodules.  Repeat CT scan in 1 year recommended to evaluate stability. -Patient states that she will reschedule for c-scope sometime within the next 1 to 2 months.  She is not ready to do it immediately. -She was prescribed a regimen that included Flagyl for H. pylori by the gastroenterologist.  She reports an area made by the pharmacy.  She was supposed to be on 250 mg QID but instead was given 500 mg tablets with instructions to take 2 tablets QID.  After taking for 24 hours, she did not feel right so she called the pharmacy and was then informed of the error.  Patient is upset stating that she did not get an apology from the pharmacist.  She states that he only acknowledged the error and then told her how to take it correctly.  I did receive information from the pharmacy that they did offer to have her come back and bring the 500 mg tablets but patient states that this was never offered to her. -She just started taking the correct regimen 3 days ago.  Reports palpitations when she takes the combination of Flagyl, doxycycline, bismuth and omeprazole.  She is wearing a soft removable back support stating that the medications have been causing her back to ache also.  HTN: took meds already for the a.m.  Did no check BP  lately. No LE edema.  HepC:  Completed Harvoni.  Cured.   Tob dep:  Down to 1 pk/wk  Patient Active Problem List   Diagnosis Date Noted  . Tobacco dependence 03/17/2017  . Cyst of skin 03/17/2017  . Primary osteoarthritis of right knee 10/28/2016  . Chronic hepatitis C without hepatic coma ([place]) 03/26/2016  . Chronic right shoulder pain 01/17/2015  . Hypertension 03/05/2013     Current Outpatient Medications on File Prior to Visit  Medication Sig Dispense Refill  . albuterol (PROVENTIL HFA;VENTOLIN HFA) 108 (90 Base) MCG/ACT inhaler Inhale 2 puffs into the lungs every 4 (four) hours as needed for wheezing or shortness of breath. 1 Inhaler 2  . amLODipine (NORVASC) 10 MG tablet Take 1 tablet (10 mg total) by mouth daily. 90 tablet 3  . carvedilol (COREG) 6.25 MG tablet Take 0.5 tablets (3.125 mg total) by mouth 2 (two) times daily with a meal. 90 tablet 5  . fluticasone (FLONASE) 50 MCG/ACT nasal spray Place 2 sprays into both nostrils at bedtime. 16 g 2  . loratadine (CLARITIN) 10 MG tablet Take 1 tablet (10 mg total) by mouth daily. 30 tablet 11  . losartan-hydrochlorothiazide (HYZAAR) 100-25 MG tablet Take 1 tablet by mouth daily. 30 tablet 6   No current facility-administered medications on file prior to visit.     No Known Allergies  Social History   Socioeconomic History  .  Marital status: Single    Spouse name: Not on file  . Number of children: 5  . Years of education: Not on file  . Highest education level: Not on file  Occupational History  . Occupation: Publishing copy  Social Needs  . Financial resource strain: Not on file  . Food insecurity:    Worry: Not on file    Inability: Not on file  . Transportation needs:    Medical: Not on file    Non-medical: Not on file  Tobacco Use  . Smoking status: Current Every Day Smoker    Packs/day: 0.50    Years: 31.00    Pack years: 15.50    Types: Cigarettes  . Smokeless tobacco: Never Used  . Tobacco comment:  states she cut back 3 cigarettes/day  Substance and Sexual Activity  . Alcohol use: No  . Drug use: No  . Sexual activity: Not on file  Lifestyle  . Physical activity:    Days per week: Not on file    Minutes per session: Not on file  . Stress: Not on file  Relationships  . Social connections:    Talks on phone: Not on file    Gets together: Not on file    Attends religious service: Not on file    Active member of club or organization: Not on file    Attends meetings of clubs or organizations: Not on file    Relationship status: Not on file  . Intimate partner violence:    Fear of current or ex partner: Not on file    Emotionally abused: Not on file    Physically abused: Not on file    Forced sexual activity: Not on file  Other Topics Concern  . Not on file  Social History Narrative  . Not on file    Family History  Problem Relation Age of Onset  . Hypertension Mother   . Heart disease Mother   . Hypertension Father   . Dementia Father   . Arthritis Sister   . Hypertension Sister   . Arthritis Brother   . Hypertension Brother   . Colon cancer Neg Hx   . Stomach cancer Neg Hx   . Rectal cancer Neg Hx   . Esophageal cancer Neg Hx     Past Surgical History:  Procedure Laterality Date  . CESAREAN SECTION    . COLONOSCOPY WITH ESOPHAGOGASTRODUODENOSCOPY (EGD)  03/2007  . [place]   as a child  . WISDOM TOOTH EXTRACTION      ROS: Review of Systems Skin: Reports that this small mass in the right inguinal area that she has had for several years is still there.  She thinks it has increased in size.  She had mentioned this to me on office visit 03/2017.  At that time it was measured to be about 2x2 cm in size. PHYSICAL EXAM: BP (!) 177/120 (BP Location: Right Arm, Cuff Size: Small)   Pulse 69   Temp 97.8 F (36.6 C) (Oral)   Resp 16   Wt 102 lb 9.6 oz (46.5 kg)   SpO2 98%   BMI 19.87 kg/m   Wt Readings from Last 3 Encounters:  02/27/18 102 lb 9.6 oz  (46.5 kg)  02/04/18 103 lb (46.7 kg)  01/14/18 103 lb (46.7 kg)    Physical Exam  General appearance - alert, well appearing, middle-aged female and in no distress Mental status -patient answers questions appropriately. Neck - supple, no significant  adenopathy Chest - clear to auscultation, no wheezes, rales or rhonchi, symmetric air entry Heart - normal rate, regular rhythm, normal S1, S2, no murmurs, rubs, clicks or gallops Abdomen - soft, nontender, nondistended, no masses or organomegaly Extremities - peripheral pulses normal, no pedal edema, no clubbing or cyanosis Skin -soft movable mass that still 2 x 2 cm in the lateral right inguinal area slightly below the inguinal ligament  ASSESSMENT AND PLAN: 1. Essential hypertension Not at goal.  She is on maximum dose of Cozaar/HCTZ, amlodipine.  I do not want to increase the carvedilol as I think it would drop her pulse rate too low.  We will add hydralazine.  Advised to limit salt as much as possible.  Follow-up in several weeks for repeat blood pressure check. - hydrALAZINE (APRESOLINE) 25 MG tablet; Take 1 tablet (25 mg total) by mouth 2 (two) times daily.  Dispense: 60 tablet; Refill: 3  2. Tobacco dependence Commended her on cutting back.  Advised to quit completely.  She is working on doing so.  3. Helicobacter pylori (H. pylori) infection I did apologize to the patient for the error made at the pharmacy in regards to the Flagyl dosing.  I informed her that I will speak to the head of the pharmacy about it today which I did. -She did have some questions about what H pylori infection was all about.  I went over this with her informing her that this bacteria has been associated with gastritis, peptic ulcer disease and certain types of GI cancers.  I think the regimen that she is on is good.  However because she is having side effects we will go ahead and change her to Biaxin, amoxicillin and omeprazole for 10 days.  When she is  completed the regimen we will plan to repeat H. pylori testing when I see her again in 4 to 5 weeks. - clarithromycin (BIAXIN) 500 MG tablet; Take 1 tablet (500 mg total) by mouth 2 (two) times daily.  Dispense: 20 tablet; Refill: 0 - amoxicillin (AMOXIL) 500 MG capsule; Take 2 capsules (1,000 mg total) by mouth 2 (two) times daily.  Dispense: 40 capsule; Refill: 0 - omeprazole (PRILOSEC) 20 MG capsule; Take 1 capsule (20 mg total) by mouth 2 (two) times daily before a meal.  Dispense: 20 capsule; Refill: 0  4. Palpitations See #3 above  5. Renal mass - MR Abdomen W Wo Contrast; Future  6. Lung nodule, multiple Strongly advised to quit smoking.  Informed her that she is at risk to develop lung cancer because of smoking.  We will have to repeat the CAT scan in 1 year to assess for any growth in these nodules.  7. Lipoma of other specified sites Versus cysts in the right inguinal area.  It seems stable in size compared to almost 1 year ago.  I advised that we continue to monitor.  However I did give her the option of referring her to dermatology in the future if she would like to have it removed  8. Other specified disorders of kidney and ureter - MR Abdomen W Wo Contrast; Future   Patient was given the opportunity to ask questions.  Patient verbalized understanding of the plan and was able to repeat key elements of the plan.   Orders Placed This Encounter  Procedures  . MR Abdomen W Wo Contrast     Requested Prescriptions   Signed Prescriptions Disp Refills  . hydrALAZINE (APRESOLINE) 25 MG tablet 60 tablet 3  Sig: Take 1 tablet (25 mg total) by mouth 2 (two) times daily.  . clarithromycin (BIAXIN) 500 MG tablet 20 tablet 0    Sig: Take 1 tablet (500 mg total) by mouth 2 (two) times daily.  Marland Kitchen amoxicillin (AMOXIL) 500 MG capsule 40 capsule 0    Sig: Take 2 capsules (1,000 mg total) by mouth 2 (two) times daily.  Marland Kitchen omeprazole (PRILOSEC) 20 MG capsule 20 capsule 0    Sig: Take 1  capsule (20 mg total) by mouth 2 (two) times daily before a meal.    Return in about 5 weeks (around 04/03/2018).  Karle Plumber, MD, FACP

## 2018-03-02 ENCOUNTER — Telehealth: Payer: Self-pay | Admitting: Internal Medicine

## 2018-03-02 NOTE — Telephone Encounter (Signed)
Phone call placed to patient this a.m. to follow-up with her on my conversation with the pharmacist at [facility].  I left a message on voicemail just letting her know that I was trying to reach her.

## 2018-03-05 ENCOUNTER — Other Ambulatory Visit: Payer: Self-pay | Admitting: Internal Medicine

## 2018-03-05 DIAGNOSIS — N2889 Other specified disorders of kidney and ureter: Secondary | ICD-10-CM

## 2018-03-07 ENCOUNTER — Ambulatory Visit (HOSPITAL_COMMUNITY): Payer: Self-pay

## 2018-03-23 MED FILL — CARVEDILOL 6.25 MG TABLET: 6.25 | 60 days supply | Qty: 60 | Fill #4

## 2018-03-23 MED FILL — LOSARTAN-HCTZ 100-25 MG TAB: 100-25 | 30 days supply | Qty: 30 | Fill #1

## 2018-03-30 ENCOUNTER — Ambulatory Visit: Payer: Self-pay | Admitting: Pharmacist

## 2018-04-09 ENCOUNTER — Encounter: Payer: Self-pay | Admitting: Internal Medicine

## 2018-04-09 ENCOUNTER — Ambulatory Visit: Payer: Self-pay | Attending: Internal Medicine | Admitting: Internal Medicine

## 2018-04-09 VITALS — BP 138/88 | HR 62 | Temp 98.1°F | Ht 60.0 in | Wt 101.0 lb

## 2018-04-09 DIAGNOSIS — R634 Abnormal weight loss: Secondary | ICD-10-CM | POA: Insufficient documentation

## 2018-04-09 DIAGNOSIS — Z681 Body mass index (BMI) 19 or less, adult: Secondary | ICD-10-CM | POA: Insufficient documentation

## 2018-04-09 DIAGNOSIS — Z8619 Personal history of other infectious and parasitic diseases: Secondary | ICD-10-CM | POA: Insufficient documentation

## 2018-04-09 DIAGNOSIS — Z2821 Immunization not carried out because of patient refusal: Secondary | ICD-10-CM

## 2018-04-09 DIAGNOSIS — Z8261 Family history of arthritis: Secondary | ICD-10-CM | POA: Insufficient documentation

## 2018-04-09 DIAGNOSIS — Z8249 Family history of ischemic heart disease and other diseases of the circulatory system: Secondary | ICD-10-CM | POA: Insufficient documentation

## 2018-04-09 DIAGNOSIS — I10 Essential (primary) hypertension: Secondary | ICD-10-CM | POA: Insufficient documentation

## 2018-04-09 DIAGNOSIS — N2889 Other specified disorders of kidney and ureter: Secondary | ICD-10-CM | POA: Insufficient documentation

## 2018-04-09 DIAGNOSIS — M1711 Unilateral primary osteoarthritis, right knee: Secondary | ICD-10-CM | POA: Insufficient documentation

## 2018-04-09 DIAGNOSIS — Z7951 Long term (current) use of inhaled steroids: Secondary | ICD-10-CM | POA: Insufficient documentation

## 2018-04-09 DIAGNOSIS — F17210 Nicotine dependence, cigarettes, uncomplicated: Secondary | ICD-10-CM | POA: Insufficient documentation

## 2018-04-09 DIAGNOSIS — Z79899 Other long term (current) drug therapy: Secondary | ICD-10-CM | POA: Insufficient documentation

## 2018-04-09 DIAGNOSIS — A048 Other specified bacterial intestinal infections: Secondary | ICD-10-CM | POA: Insufficient documentation

## 2018-04-09 DIAGNOSIS — F17200 Nicotine dependence, unspecified, uncomplicated: Secondary | ICD-10-CM

## 2018-04-09 NOTE — Progress Notes (Signed)
Patient is here to follow-up on blood pressure.  Pt. Stated she is only taking Losartan, Amlodipine, and Carvedilol for hypertension.

## 2018-04-09 NOTE — Patient Instructions (Signed)
Consider setting a quit date for smoking cessation. Continue to monitor your blood pressure 2-3 times a week.  Goal is 130/80 or lower.

## 2018-04-09 NOTE — Progress Notes (Signed)
Patient ID: Lori Bush, female    DOB: October 04, 1960  MRN: [id number]  CC: Follow-up   Subjective: Lori Bush is a 57 y.o. female who presents for chronic ds management. Her concerns today include:  Hx of HTN, hep C cured with Harvoni, tobacco dep, wgh loss.  H.pylori: Patient completed the regimen for H. pylori that was prescribed on last visit.  It has been about a month since she has completed it.  She is not on omeprazole.  Plan today is for H. pylori breath test to confirm eradication. -Informed patient that I did speak with the head of our pharmacy after seeing her on last visit.  She was aware of the area that we had spoken about and has taken steps to try to prevent it from happening again.  Patient thanked me for following up on that.  LT kidney nodule:  Missed MRI appt. she is agreeable to having this rescheduled by my CMA today.  HTN:  Hydralazine added on last visit.  Went to pharmacy to pick up but it was not ready and did not have time to come back so she never started it.   -BP checked at dental office 2 wks ago was 120/80.  -she checks BP 2-3 x a wk.  Gives range 128-140/80s -limiting salt  Tob dep:  She is plugged in with 1 800 quit Now.  She speaks with her sponsor every night and is found this helpful as she tries to quit.  Not using any nicotine replacement product.  Smoking 6 cig/day.  She has not set a quit date.   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 Renal mass 02/27/2018  . Helicobacter pylori (H. pylori) infection 02/27/2018  . Lung nodule, multiple 02/27/2018  . Tobacco dependence 03/17/2017  . Cyst of skin 03/17/2017  . Primary osteoarthritis of right knee 10/28/2016  . Chronic hepatitis C without hepatic coma ([place]) 03/26/2016  . Chronic right shoulder pain 01/17/2015  . Hypertension 03/05/2013     Current Outpatient  Medications on File Prior to Visit  Medication Sig Dispense Refill  . albuterol (PROVENTIL HFA;VENTOLIN HFA) 108 (90 Base) MCG/ACT inhaler Inhale 2 puffs into the lungs every 4 (four) hours as needed for wheezing or shortness of breath. 1 Inhaler 2  . amLODipine (NORVASC) 10 MG tablet Take 1 tablet (10 mg total) by mouth daily. 90 tablet 3  . carvedilol (COREG) 6.25 MG tablet Take 0.5 tablets (3.125 mg total) by mouth 2 (two) times daily with a meal. 90 tablet 5  . fluticasone (FLONASE) 50 MCG/ACT nasal spray Place 2 sprays into both nostrils at bedtime. 16 g 2  . loratadine (CLARITIN) 10 MG tablet Take 1 tablet (10 mg total) by mouth daily. 30 tablet 11  . losartan-hydrochlorothiazide (HYZAAR) 100-25 MG tablet Take 1 tablet by mouth daily. 30 tablet 6   No current facility-administered medications on file prior to visit.     No Known Allergies  Social History   Socioeconomic History  . Marital status: Single    Spouse name: Not on file  . Number of children: 5  . Years of education: Not on file  . Highest education level: Not on file  Occupational History  . Occupation: Publishing copy  Social Needs  . Financial resource strain: Not on file  . Food insecurity:  Worry: Not on file    Inability: Not on file  . Transportation needs:    Medical: Not on file    Non-medical: Not on file  Tobacco Use  . Smoking status: Current Every Day Smoker    Packs/day: 0.50    Years: 31.00    Pack years: 15.50    Types: Cigarettes  . Smokeless tobacco: Never Used  . Tobacco comment: states she cut back 3 cigarettes/day  Substance and Sexual Activity  . Alcohol use: No  . Drug use: No  . Sexual activity: Not on file  Lifestyle  . Physical activity:    Days per week: Not on file    Minutes per session: Not on file  . Stress: Not on file  Relationships  . Social connections:    Talks on phone: Not on file    Gets together: Not on file    Attends religious service: Not on file     Active member of club or organization: Not on file    Attends meetings of clubs or organizations: Not on file    Relationship status: Not on file  . Intimate partner violence:    Fear of current or ex partner: Not on file    Emotionally abused: Not on file    Physically abused: Not on file    Forced sexual activity: Not on file  Other Topics Concern  . Not on file  Social History Narrative  . Not on file    Family History  Problem Relation Age of Onset  . Hypertension Mother   . Heart disease Mother   . Hypertension Father   . Dementia Father   . Arthritis Sister   . Hypertension Sister   . Arthritis Brother   . Hypertension Brother   . Colon cancer Neg Hx   . Stomach cancer Neg Hx   . Rectal cancer Neg Hx   . Esophageal cancer Neg Hx     Past Surgical History:  Procedure Laterality Date  . CESAREAN SECTION    . COLONOSCOPY WITH ESOPHAGOGASTRODUODENOSCOPY (EGD)  03/2007  . [place]   as a child  . WISDOM TOOTH EXTRACTION      ROS: Review of Systems  Constitutional:       Denies fever, night sweats, depressed mood.  Has a 39 yr old autistic son who lives with her.  Things can get stressful at times.   PHYSICAL EXAM: BP 138/88 (BP Location: Left Arm, Patient Position: Sitting, Cuff Size: Small)   Pulse 62   Temp 98.1 F (36.7 C) (Oral)   Ht 5' (1.524 m)   Wt 101 lb (45.8 kg)   SpO2 98%   BMI 19.73 kg/m   Wt Readings from Last 3 Encounters:  04/09/18 101 lb (45.8 kg)  02/27/18 102 lb 9.6 oz (46.5 kg)  02/04/18 103 lb (46.7 kg)    Physical Exam General appearance - alert, well appearing, and in no distress Mental status - normal mood, behavior, speech, dress, motor activity, and thought processes Neck - supple, no significant adenopathy Lymphatics -no cervical or axillary lymphadenopathy Chest - clear to auscultation, no wheezes, rales or rhonchi, symmetric air entry Heart - normal rate, regular rhythm, normal S1, S2, no murmurs, rubs, clicks  or gallops Abdomen - soft, nontender, nondistended, no masses or organomegaly Extremities - peripheral pulses normal, no pedal edema, no clubbing or cyanosis  ASSESSMENT AND PLAN: 1. Essential hypertension Not at goal today but improved.  Ambulatory blood  pressure readings also close to goal.  I informed her that the goal is 130/80 or lower.  She will continue to limit salt in the foods.  I have taken hydralazine off the list since she never filled the prescription.  2. Tobacco dependence Continue to encourage her towards smoking cessation.  Advised her to set a quit date.  3. Helicobacter pylori (H. pylori) infection - H. pylori breath test  4. Renal mass My CMA to schedule the MRI  5. Weight loss Work-up so far unrevealing.  She denies depression. She is up-to-date with age-appropriate screenings.  6. Influenza vaccination declined  Patient was given the opportunity to ask questions.  Patient verbalized understanding of the plan and was able to repeat key elements of the plan.   Orders Placed This Encounter  Procedures  . H. pylori breath test     Requested Prescriptions    No prescriptions requested or ordered in this encounter    Return in about 3 months (around 07/10/2018).  Karle Plumber, MD, FACP

## 2018-04-10 LAB — H. PYLORI BREATH TEST: H pylori Breath Test: NEGATIVE

## 2018-04-10 LAB — CREATININE, SERUM
Creatinine, Ser: 0.9 mg/dL (ref 0.57–1.00)
GFR, EST AFRICAN AMERICAN: 82 mL/min/{1.73_m2} (ref >59)
GFR, EST NON AFRICAN AMERICAN: 71 mL/min/{1.73_m2} (ref >59)

## 2018-04-21 ENCOUNTER — Ambulatory Visit (HOSPITAL_COMMUNITY): Payer: Self-pay

## 2018-05-05 MED FILL — LOSARTAN-HCTZ 100-25 MG TAB: 100-25 | 90 days supply | Qty: 90 | Fill #2

## 2018-05-06 ENCOUNTER — Ambulatory Visit: Payer: Self-pay | Attending: Family Medicine | Admitting: Physician Assistant

## 2018-05-06 DIAGNOSIS — Z79899 Other long term (current) drug therapy: Secondary | ICD-10-CM | POA: Insufficient documentation

## 2018-05-06 DIAGNOSIS — R053 Chronic cough: Secondary | ICD-10-CM

## 2018-05-06 DIAGNOSIS — R05 Cough: Secondary | ICD-10-CM

## 2018-05-06 DIAGNOSIS — J069 Acute upper respiratory infection, unspecified: Secondary | ICD-10-CM | POA: Insufficient documentation

## 2018-05-06 MED ORDER — BENZONATATE 100 MG PO CAPS: 200.0000 mg | ORAL_CAPSULE | Freq: Three times a day (TID) | ORAL | 0 refills | Status: DC | PRN

## 2018-05-06 MED ORDER — AZITHROMYCIN 250 MG PO TABS: ORAL_TABLET | ORAL | 0 refills | Status: DC

## 2018-05-06 MED ORDER — ALBUTEROL SULFATE HFA 108 (90 BASE) MCG/ACT IN AERS: 2.0000 | INHALATION_SPRAY | RESPIRATORY_TRACT | 2 refills | Status: DC | PRN

## 2018-05-06 MED FILL — ALBUTEROL SULFATE HFA 108 (: 108 (90 BAS | 16 days supply | Qty: 18 | Fill #0

## 2018-05-06 MED FILL — AZITHROMYCIN 250 MG TABLET: 250 | 5 days supply | Qty: 6 | Fill #0

## 2018-05-06 MED FILL — BENZONATATE 100 MG CAP: 100 | 6 days supply | Qty: 40 | Fill #0

## 2018-05-06 NOTE — Progress Notes (Signed)
Patient ID: Lori Bush, female   DOB: 06-Aug-1960, 57 y.o.   MRN: [id number]   Lori Bush, is a 57 y.o. female  [id number]  [id number]  DOB - 1961-01-19  Subjective:  Chief Complaint and HPI: Lori Bush is a 57 y.o. female here today for 3 week h/o progressively worse cough.  Coughing up yellow mucus.  No fever.  +decreased appetite.  No hemoptysis.     ROS:   Constitutional:  No f/c, No night sweats, No unexplained weight loss. EENT:  No vision changes, No blurry vision, No hearing changes. No mouth, throat, or ear problems.  Respiratory: + cough, No SOB Cardiac: No CP, no palpitations GI:  No abd pain, No N/V/D. GU: No Urinary s/sx Musculoskeletal: No joint pain Neuro: No headache, no dizziness, no motor weakness.  Skin: No rash Endocrine:  No polydipsia. No polyuria.  Psych: Denies SI/HI  No problems updated.  ALLERGIES: No Known Allergies  PAST MEDICAL HISTORY: Past Medical History:  Diagnosis Date  . Arthritis Dx 2012  . Hypertension Dx 2005    MEDICATIONS AT HOME: Prior to Admission medications   Medication Sig Start Date End Date Taking? Authorizing Provider  albuterol (PROVENTIL HFA;VENTOLIN HFA) 108 (90 Base) MCG/ACT inhaler Inhale 2 puffs into the lungs every 4 (four) hours as needed for wheezing or shortness of breath. 05/06/18  Yes Freeman Caldron M, PA-C  amLODipine (NORVASC) 10 MG tablet Take 1 tablet (10 mg total) by mouth daily. 11/17/17  Yes Ladell Pier, MD  carvedilol (COREG) 6.25 MG tablet Take 0.5 tablets (3.125 mg total) by mouth 2 (two) times daily with a meal. 06/20/17  Yes Ladell Pier, MD  fluticasone (FLONASE) 50 MCG/ACT nasal spray Place 2 sprays into both nostrils at bedtime. 11/17/17  Yes Ladell Pier, MD  loratadine (CLARITIN) 10 MG tablet Take 1 tablet (10 mg total) by mouth daily. 11/17/17  Yes Ladell Pier, MD  losartan-hydrochlorothiazide (HYZAAR) 100-25 MG tablet Take 1 tablet by  mouth daily. 11/17/17  Yes Ladell Pier, MD  azithromycin [facility]) 250 MG tablet Take 2 today then 1 daily 05/06/18   Freeman Caldron M, PA-C  benzonatate (TESSALON) 100 MG capsule Take 2 capsules (200 mg total) by mouth 3 (three) times daily as needed for cough. 05/06/18   Argentina Donovan, PA-C     Objective:  EXAM:   Vitals:   05/06/18 1429  BP: (!) 143/75  Pulse: 73  Temp: 98 F (36.7 C)  TempSrc: Oral  SpO2: 99%  Weight: 99 lb 9.6 oz (45.2 kg)  Height: 5' (1.524 m)    General appearance : A&OX3. NAD. Non-toxic-appearing HEENT: Atraumatic and Normocephalic.  PERRLA. EOM intact.  TM clear B. Mouth-MMM, post pharynx WNL w/o erythema, No PND. Neck: supple, no JVD. No cervical lymphadenopathy. No thyromegaly Chest/Lungs:  Breathing-non-labored, Good air entry bilaterally, breath sounds normal without rales, rhonchi, or wheezing  CVS: S1 S2 regular, no murmurs, gallops, rubs  Extremities: Bilateral Lower Ext shows no edema, both legs are warm to touch with = pulse throughout Neurology:  CN II-XII grossly intact, Non focal.   Psych:  TP linear. J/I WNL. Normal speech. Appropriate eye contact and affect.  Skin:  No Rash  Data Review Lab Results  Component Value Date   HGBA1C 5.70 08/01/2015   HGBA1C 5.2 09/30/2013     Assessment & Plan   1. Upper respiratory tract infection, unspecified type Will cover for atypicals due to length of illness - azithromycin (ZITHROMAX)  250 MG tablet; Take 2 today then 1 daily  Dispense: 6 tablet; Refill: 0 - albuterol (PROVENTIL HFA;VENTOLIN HFA) 108 (90 Base) MCG/ACT inhaler; Inhale 2 puffs into the lungs every 4 (four) hours as needed for wheezing or shortness of breath.  Dispense: 1 Inhaler; Refill: 2 - benzonatate (TESSALON) 100 MG capsule; Take 2 capsules (200 mg total) by mouth 3 (three) times daily as needed for cough.  Dispense: 40 capsule; Refill: 0  2. Chronic cough - albuterol (PROVENTIL HFA;VENTOLIN HFA) 108 (90 Base)  MCG/ACT inhaler; Inhale 2 puffs into the lungs every 4 (four) hours as needed for wheezing or shortness of breath.  Dispense: 1 Inhaler; Refill: 2     Patient have been counseled extensively about nutrition and exercise  Return if symptoms worsen or fail to improve.  The patient was given clear instructions to go to ER or return to medical center if symptoms don't improve, worsen or new problems develop. The patient verbalized understanding. The patient was told to call to get lab results if they haven't heard anything in the next week.     Freeman Caldron, PA-C [facility] and [facility], [place]   05/06/2018, 2:36 PM

## 2018-06-24 ENCOUNTER — Other Ambulatory Visit: Payer: Self-pay | Admitting: Internal Medicine

## 2018-06-24 DIAGNOSIS — I10 Essential (primary) hypertension: Secondary | ICD-10-CM

## 2018-07-02 MED FILL — CARVEDILOL 6.25 MG TABLET: 6.25 | 30 days supply | Qty: 30 | Fill #0

## 2018-08-11 MED FILL — AMLODIPINE BESYLATE 10 MG T: 10 | 90 days supply | Qty: 90 | Fill #1

## 2018-08-11 MED FILL — CARVEDILOL 6.25 MG TABLET: 6.25 | 30 days supply | Qty: 30 | Fill #1

## 2018-09-16 ENCOUNTER — Emergency Department (HOSPITAL_COMMUNITY)
Admission: EM | Admit: 2018-09-16 | Discharge: 2018-09-17 | Disposition: A | Discharge status: Home / Self Care | Payer: Self-pay | Attending: Emergency Medicine | Admitting: Emergency Medicine

## 2018-09-16 ENCOUNTER — Emergency Department (HOSPITAL_COMMUNITY): Payer: Self-pay

## 2018-09-16 ENCOUNTER — Other Ambulatory Visit: Payer: Self-pay

## 2018-09-16 ENCOUNTER — Encounter (HOSPITAL_COMMUNITY): Payer: Self-pay

## 2018-09-16 DIAGNOSIS — M4802 Spinal stenosis, cervical region: Secondary | ICD-10-CM | POA: Insufficient documentation

## 2018-09-16 DIAGNOSIS — Z79899 Other long term (current) drug therapy: Secondary | ICD-10-CM | POA: Insufficient documentation

## 2018-09-16 DIAGNOSIS — F17210 Nicotine dependence, cigarettes, uncomplicated: Secondary | ICD-10-CM | POA: Insufficient documentation

## 2018-09-16 DIAGNOSIS — I10 Essential (primary) hypertension: Secondary | ICD-10-CM | POA: Insufficient documentation

## 2018-09-16 DIAGNOSIS — L821 Other seborrheic keratosis: Secondary | ICD-10-CM | POA: Insufficient documentation

## 2018-09-16 DIAGNOSIS — M779 Enthesopathy, unspecified: Secondary | ICD-10-CM | POA: Insufficient documentation

## 2018-09-16 MED ORDER — PREDNISONE 10 MG (21) PO TBPK: ORAL_TABLET | Freq: Every day | ORAL | 0 refills | Status: DC

## 2018-09-16 MED ORDER — CYCLOBENZAPRINE HCL 10 MG PO TABS: 10.0000 mg | ORAL_TABLET | Freq: Two times a day (BID) | ORAL | 0 refills | Status: DC | PRN

## 2018-09-16 MED ORDER — KETOROLAC TROMETHAMINE 60 MG/2ML IM SOLN
30.0000 mg | Freq: Once | INTRAMUSCULAR | Status: AC
  Administered 2018-09-16: 30 mg via INTRAMUSCULAR
  Filled 2018-09-16: qty 2

## 2018-09-16 NOTE — Discharge Instructions (Addendum)
Thank you for allowing me to care for you today in the Emergency Department.   Your symptoms are consistent with severe arthritis and bone spurs in your neck.  To treat your symptoms at home, you can take 650 mg of Tylenol or 6 mg of ibuprofen once every 6 hours for pain control or alternate between these 2 medications every 3 hours.  Starting tomorrow: take 6 tabs of prednisone by mouth daily  for 2 days, then 5 tabs for 2 days, then 4 tabs for 2 days, then 3 tabs for 2 days, 2 tabs for 2 days, then 1 tab by mouth daily for 2 days  You can take 1 tablet of Flexeril by mouth 2 times daily.  Do not work or drive until taking this medication because it may make you drowsy.  Do not take other substances or medications that may also make you drowsy.  Start to stretch the muscles of the neck as your pain allows.  Return to the emergency department if you develop new or worsening symptoms including high fever, if you become unable to move your neck, if you have any fall or injury, if you begin to have left-sided symptoms, if you develop severe redness, warmth, and swelling in the arms, or other new, concerning symptoms.

## 2018-09-16 NOTE — ED Triage Notes (Signed)
Pt report right shoulder, arm and back pain that started 2 weeks ago. Denies injury or recent falls. Pt trying advil and OTC creams without relief. Pt denies numbness in the arm

## 2018-09-16 NOTE — ED Provider Notes (Signed)
[facility] EMERGENCY DEPARTMENT Provider Note   CSN: [id number] Arrival date & time: 09/16/18  1802    History   Chief Complaint Chief Complaint  Patient presents with  . Arm Pain    HPI Lori Bush is a 58 y.o. female with a history of hypertension, chronic hepatitis C s/p Harvoni, H. pylori infection, and chronic right shoulder pain who presents to the emergency department with a chief complaint of right arm weakness.  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  She reports she has been having pain in her right neck that radiates down her right shoulder and arm for the last 2 to 3 weeks.  She characterizes the pain as "tension" and "tightness".  She reports that she has been unable to lift her arm more than at 90 degrees since earlier today.  No specific injury or fall.  She reports that last week she was carrying a plate of meat loaf when she lost her grip strength and dropped the plate.  She denies numbness, left sided neck or arm pain, chest pain, shortness of breath, fever, or chills.  She reports that she was treated with Harvoni for chronic hepatitis C approximately 1.5 years ago.  She reports a 30 pound weight loss since she was treated with the medication.  She also reports that she has not been having night sweats for the last year.  She reports that she has a lesion to her right shoulder that has been followed by her PCP, but she states she feels as if some of the pain in her right shoulder is localized near the lesion.  She states that it used to be bigger but "fell off" a couple of years ago.   States she has a history of rheumatoid arthritis?  But does not take any medications for it.  She has been treating her symptoms with Tiger balm and ibuprofen at home without relief.     The history is provided by the patient. No language interpreter was used.    Past Medical History:  Diagnosis  Date  . Arthritis Dx 2012  . Hypertension Dx 2005    Patient Active Problem List   Diagnosis Date Noted  . Renal mass 02/27/2018  . Helicobacter pylori (H. pylori) infection 02/27/2018  . Lung nodule, multiple 02/27/2018  . Tobacco dependence 03/17/2017  . Cyst of skin 03/17/2017  . Primary osteoarthritis of right knee 10/28/2016  . Chronic hepatitis C without hepatic coma ([place]) 03/26/2016  . Chronic right shoulder pain 01/17/2015  . Hypertension 03/05/2013    Past Surgical History:  Procedure Laterality Date  . CESAREAN SECTION    . COLONOSCOPY WITH ESOPHAGOGASTRODUODENOSCOPY (EGD)  03/2007  . [place]   as a child  . WISDOM TOOTH EXTRACTION       OB History   No obstetric history on file.      Home Medications    Prior to Admission medications   Medication Sig Start Date End Date Taking? Authorizing Provider  albuterol (PROVENTIL HFA;VENTOLIN HFA) 108 (90 Base) MCG/ACT inhaler Inhale 2 puffs into the lungs every 4 (four) hours as needed for wheezing or shortness of breath. 05/06/18   Argentina Donovan, PA-C  amLODipine (NORVASC) 10 MG tablet Take 1 tablet (10 mg total) by mouth daily. 11/17/17   Ladell Pier, MD  azithromycin (ZITHROMAX) 250 MG tablet Take 2 today then 1 daily 05/06/18  Freeman Caldron M, PA-C  benzonatate (TESSALON) 100 MG capsule Take 2 capsules (200 mg total) by mouth 3 (three) times daily as needed for cough. 05/06/18   Argentina Donovan, PA-C  carvedilol (COREG) 6.25 MG tablet TAKE 1/2 TABLET BY MOUTH 2 TIMES DAILY WITH A MEAL. 06/26/18   Ladell Pier, MD  cyclobenzaprine (FLEXERIL) 10 MG tablet Take 1 tablet (10 mg total) by mouth 2 (two) times daily as needed for muscle spasms. 09/16/18   Haru Anspaugh A, PA-C  fluticasone (FLONASE) 50 MCG/ACT nasal spray Place 2 sprays into both nostrils at bedtime. 11/17/17   Ladell Pier, MD  loratadine (CLARITIN) 10 MG tablet Take 1 tablet (10 mg total) by mouth daily. 11/17/17    Ladell Pier, MD  losartan-hydrochlorothiazide (HYZAAR) 100-25 MG tablet Take 1 tablet by mouth daily. 11/17/17   Ladell Pier, MD  predniSONE (STERAPRED UNI-PAK 21 TAB) 10 MG (21) TBPK tablet Take by mouth daily. Take 6 tabs by mouth dailyx2 days, then 5 x2 days, then 4 x2 days, then 3 x2 days, 2 tabsx 2 days, then 1 tab x2 days 09/16/18   Joanne Gavel, PA-C    Family History Family History  Problem Relation Age of Onset  . Hypertension Mother   . Heart disease Mother   . Hypertension Father   . Dementia Father   . Arthritis Sister   . Hypertension Sister   . Arthritis Brother   . Hypertension Brother   . Colon cancer Neg Hx   . Stomach cancer Neg Hx   . Rectal cancer Neg Hx   . Esophageal cancer Neg Hx     Social History Social History   Tobacco Use  . Smoking status: Current Every Day Smoker    Packs/day: 0.50    Years: 31.00    Pack years: 15.50    Types: Cigarettes  . Smokeless tobacco: Never Used  . Tobacco comment: states she cut back 3 cigarettes/day  Substance Use Topics  . Alcohol use: No  . Drug use: No     Allergies   Patient has no known allergies.   Review of Systems Review of Systems  Constitutional: Negative for activity change.  Respiratory: Negative for shortness of breath.   Cardiovascular: Negative for chest pain.  Gastrointestinal: Negative for abdominal pain.  Genitourinary: Negative for dysuria.  Musculoskeletal: Positive for arthralgias, myalgias and neck pain. Negative for back pain, joint swelling and neck stiffness.  Skin: Positive for wound. Negative for rash.  Allergic/Immunologic: Negative for immunocompromised state.  Neurological: Positive for weakness. Negative for numbness and headaches.  Psychiatric/Behavioral: Negative for confusion.     Physical Exam Updated Vital Signs BP (!) 169/96   Pulse 66   Temp 98.5 F (36.9 C) (Oral)   Resp 16   SpO2 98%   Physical Exam Vitals signs and nursing note reviewed.   Constitutional:      General: She is not in acute distress. HENT:     Head: Normocephalic.  Eyes:     Conjunctiva/sclera: Conjunctivae normal.  Neck:     Musculoskeletal: Neck supple.  Cardiovascular:     Rate and Rhythm: Normal rate and regular rhythm.     Heart sounds: No murmur. No friction rub. No gallop.   Pulmonary:     Effort: Pulmonary effort is normal. No respiratory distress.  Abdominal:     General: There is no distension.     Palpations: Abdomen is soft.  Musculoskeletal:  General: Tenderness present. No swelling.     Right lower leg: No edema.     Left lower leg: No edema.     Comments: +Spurling test  Tender to palpation to the spinous processes of the cervical spine.  No crepitus or step-offs.  She is diffusely tender to the right shoulder and elbow, but there is no overlying redness, warmth, or swelling to the joints.  Sensation is intact and equal to the bilateral upper extremities.  4-5 strength against resistance to the right upper extremity as compared to 5-5 strength against resistance of the left upper extremity.  She is unable to actively range of motion her shoulder with extension >90 degrees. With passive range of motion, I am able to extend her left shoulder to 180 degrees, but after extension raise at 90 degrees it is painful.  Full active and passive range of motion of the left shoulder, right elbow, and right wrist.   Skin:    General: Skin is warm.     Findings: No rash.     Comments: There is a circular, black nevi located on the skin of the right posterior shoulder.  Diameter is greater than 6 mm.  Color appears homogeneous.  Borders appear regular.  Lesion is symmetric.   Neurological:     Mental Status: She is alert.  Psychiatric:        Behavior: Behavior normal.          ED Treatments / Results  Labs (all labs ordered are listed, but only abnormal results are displayed) Labs Reviewed - No data to display  EKG None  Radiology  Dg Cervical Spine Complete  Result Date: 09/16/2018 CLINICAL DATA:  58 year old female with neck, right shoulder and arm pain x2 weeks. No known injury. EXAM: CERVICAL SPINE - COMPLETE 4+ VIEW COMPARISON:  Cervical spine radiographs 06/22/2012. FINDINGS: Mild reversal of cervical lordosis which is increased since 2013. Bilateral posterior element alignment is within normal limits. Preserved AP alignment. Cervicothoracic junction alignment is within normal limits. Normal prevertebral soft tissue contour. Chronic advanced disc and endplate degeneration at C5-C6. Mild disc and endplate degeneration at the adjacent segments. These appear only mildly progressed since 2013. Normal C1-C2 alignment. Mild right side C1-C2 joint space loss. Negative visible upper chest. IMPRESSION: 1. No acute osseous abnormality identified in the cervical spine. 2. Chronic disc and endplate degeneration, maximal at C5-C6. Electronically Signed   By: Genevie Ann M.D.   On: 09/16/2018 23:30    Procedures Procedures (including critical care time)  Medications Ordered in ED Medications  ketorolac (TORADOL) injection 30 mg (30 mg Intramuscular Given 09/16/18 2240)     Initial Impression / Assessment and Plan / ED Course  I have reviewed the triage vital signs and the nursing notes.  Pertinent labs & imaging results that were available during my care of the patient were reviewed by me and considered in my medical decision making (see chart for details).        58 year old female with a history of hypertension, chronic hepatitis C s/p Harvoni, H. pylori infection, and chronic right shoulder pain who presents to the emergency department presenting with weakness of the right arm.  She is also had pain in the right upper extremity for the last 2 to 3 weeks.  She has a history of similar.  Upon chart review, the patient had cervical spine imaging in 2013 and appeared to have diffuse bone spurs to the cervical spine.  We will repeat  cervical  spine today as I suspect spurs may have worsened it may be contributing to her symptoms.  She has a positive Spurling test on exam.  She has a 4 out of 5 strength against resistance on the right as compared to 5-5 on the left.  Sensation is intact and equal.  X-ray with chronic disc and degeneration, maximal at C5-C6.  Low suspicion for ACS, transverse myelitis, cholelithiasis, occult cervical fracture.  Will discharge with a prednisone Dosepak and muscle relaxer.  She will be given a referral to neurosurgery.  Recommended conservative management.  She is agreeable with this plan and all questions of answered.  She is given Toradol for pain control and reports improvement of her symptoms.  Strict return precautions given.  She is hemodynamically stable and in no acute distress.  Safe for discharge home with outpatient follow-up at this time.  Final Clinical Impressions(s) / ED Diagnoses   Final diagnoses:  Stenosis of cervical spine  Bone spur  Seborrheic keratoses    ED Discharge Orders         Ordered    predniSONE (STERAPRED UNI-PAK 21 TAB) 10 MG (21) TBPK tablet  Daily     09/16/18 2358    cyclobenzaprine (FLEXERIL) 10 MG tablet  2 times daily PRN     09/16/18 2358           Lerline Valdivia A, PA-C 09/17/18 0004    Charlesetta Shanks, MD 09/19/18 1314

## 2018-09-16 NOTE — ED Notes (Signed)
Patient transported to X-ray 

## 2018-09-17 MED FILL — CYCLOBENZAPRINE 10 MG TAB: 10 | 10 days supply | Qty: 20 | Fill #0

## 2018-09-17 MED FILL — predniSONE 10 MG TABS: 10 | 12 days supply | Qty: 42 | Fill #0

## 2018-09-17 NOTE — ED Notes (Signed)
Pt discharged from ED; instructions provided; Pt encouraged to return to ED if symptoms worsen and to f/u with PCP; Pt verbalized understanding of all instructions 

## 2018-09-18 MED FILL — CARVEDILOL 6.25 MG TABLET: 6.25 | 30 days supply | Qty: 30 | Fill #2

## 2018-09-29 ENCOUNTER — Other Ambulatory Visit: Payer: Self-pay

## 2018-09-29 ENCOUNTER — Ambulatory Visit: Payer: Self-pay | Attending: Family Medicine | Admitting: Family Medicine

## 2018-09-29 ENCOUNTER — Encounter: Payer: Self-pay | Admitting: Family Medicine

## 2018-09-29 VITALS — BP 143/82 | HR 70 | Temp 98.0°F | Ht 60.0 in | Wt 96.6 lb

## 2018-09-29 DIAGNOSIS — M542 Cervicalgia: Secondary | ICD-10-CM

## 2018-09-29 MED ORDER — GABAPENTIN 300 MG PO CAPS: 300.0000 mg | ORAL_CAPSULE | Freq: Two times a day (BID) | ORAL | 1 refills | Status: DC

## 2018-09-29 MED ORDER — CYCLOBENZAPRINE HCL 10 MG PO TABS: 10.0000 mg | ORAL_TABLET | Freq: Every day | ORAL | 0 refills | Status: DC

## 2018-09-29 NOTE — Patient Instructions (Signed)
Cervical Strain and Sprain Rehab Ask your health care provider which exercises are safe for you. Do exercises exactly as told by your health care provider and adjust them as directed. It is normal to feel mild stretching, pulling, tightness, or discomfort as you do these exercises, but you should stop right away if you feel sudden pain or your pain gets worse.Do not begin these exercises until told by your health care provider. Stretching and range of motion exercises These exercises warm up your muscles and joints and improve the movement and flexibility of your neck. These exercises also help to relieve pain, numbness, and tingling. Exercise A: Cervical side bend  1. Using good posture, sit on a stable chair or stand up. 2. Without moving your shoulders, slowly tilt your left / right ear to your shoulder until you feel a stretch in your neck muscles. You should be looking straight ahead. 3. Hold for __________ seconds. 4. Repeat with the other side of your neck. Repeat __________ times. Complete this exercise __________ times a day. Exercise B: Cervical rotation  1. Using good posture, sit on a stable chair or stand up. 2. Slowly turn your head to the side as if you are looking over your left / right shoulder. ? Keep your eyes level with the ground. ? Stop when you feel a stretch along the side and the back of your neck. 3. Hold for __________ seconds. 4. Repeat this by turning to your other side. Repeat __________ times. Complete this exercise __________ times a day. Exercise C: Thoracic extension and pectoral stretch 1. Roll a towel or a small blanket so it is about 4 inches (10 cm) in diameter. 2. Lie down on your back on a firm surface. 3. Put the towel lengthwise, under your spine in the middle of your back. It should not be not under your shoulder blades. The towel should line up with your spine from your middle back to your lower back. 4. Put your hands behind your head and let your  elbows fall out to your sides. 5. Hold for __________ seconds. Repeat __________ times. Complete this exercise __________ times a day. Strengthening exercises These exercises build strength and endurance in your neck. Endurance is the ability to use your muscles for a long time, even after your muscles get tired. Exercise D: Upper cervical flexion, isometric 1. Lie on your back with a thin pillow behind your head and a small rolled-up towel under your neck. 2. Gently tuck your chin toward your chest and nod your head down to look toward your feet. Do not lift your head off the pillow. 3. Hold for __________ seconds. 4. Release the tension slowly. Relax your neck muscles completely before you repeat this exercise. Repeat __________ times. Complete this exercise __________ times a day. Exercise E: Cervical extension, isometric  1. Stand about 6 inches (15 cm) away from a wall, with your back facing the wall. 2. Place a soft object, about 6-8 inches (15-20 cm) in diameter, between the back of your head and the wall. A soft object could be a small pillow, a ball, or a folded towel. 3. Gently tilt your head back and press into the soft object. Keep your jaw and forehead relaxed. 4. Hold for __________ seconds. 5. Release the tension slowly. Relax your neck muscles completely before you repeat this exercise. Repeat __________ times. Complete this exercise __________ times a day. Posture and body mechanics Body mechanics refers to the movements and positions of your   body while you do your daily activities. Posture is part of body mechanics. Good posture and healthy body mechanics can help to relieve stress in your body's tissues and joints. Good posture means that your spine is in its natural S-curve position (your spine is neutral), your shoulders are pulled back slightly, and your head is not tipped forward. The following are general guidelines for applying improved posture and body mechanics to your  everyday activities. Standing   When standing, keep your spine neutral and keep your feet about hip-width apart. Keep a slight bend in your knees. Your ears, shoulders, and hips should line up.  When you do a task in which you stand in one place for a long time, place one foot up on a stable object that is 2-4 inches (5-10 cm) high, such as a footstool. This helps keep your spine neutral. Sitting   When sitting, keep your spine neutral and your keep feet flat on the floor. Use a footrest, if necessary, and keep your thighs parallel to the floor. Avoid rounding your shoulders, and avoid tilting your head forward.  When working at a desk or a computer, keep your desk at a height where your hands are slightly lower than your elbows. Slide your chair under your desk so you are close enough to maintain good posture.  When working at a computer, place your monitor at a height where you are looking straight ahead and you do not have to tilt your head forward or downward to look at the screen. Resting When lying down and resting, avoid positions that are most painful for you. Try to support your neck in a neutral position. You can use a contour pillow or a small rolled-up towel. Your pillow should support your neck but not push on it. This information is not intended to replace advice given to you by your health care provider. Make sure you discuss any questions you have with your health care provider. Document Released: 06/24/2005 Document Revised: 02/29/2016 Document Reviewed: 05/31/2015 Elsevier Interactive Patient Education  2019 [employer]vier Inc[employer]  

## 2018-09-29 NOTE — Progress Notes (Signed)
Subjective:  Patient ID: Lori Bush, female    DOB: 1960-11-11  Age: 58 y.o. MRN: [id number]  CC: Hospitalization Follow-up   HPI Lori Bush is a 58 year old female with history of hypertension, chronic hepatitis C who presents today with complaints of neck pain and right shoulder pain which she has had for the last 3 weeks.  This is associated with reduced range of motion of her right upper extremity, pain that radiates from her neck down her right arm, numbness and tingling.  She works in [employer] and is constantly lifting dishes.  Seen at the ED 1.5 weeks ago and C-spine x-ray revealed: IMPRESSION: 1. No acute osseous abnormality identified in the cervical spine. 2. Chronic disc and endplate degeneration, maximal at C5-C6.  She has run out of her temporary supply of Flexeril which she received at her ED visit.  Past Medical History:  Diagnosis Date  . Arthritis Dx 2012  . Hypertension Dx 2005    Past Surgical History:  Procedure Laterality Date  . CESAREAN SECTION    . COLONOSCOPY WITH ESOPHAGOGASTRODUODENOSCOPY (EGD)  03/2007  . [place]   as a child  . WISDOM TOOTH EXTRACTION      Family History  Problem Relation Age of Onset  . Hypertension Mother   . Heart disease Mother   . Hypertension Father   . Dementia Father   . Arthritis Sister   . Hypertension Sister   . Arthritis Brother   . Hypertension Brother   . Colon cancer Neg Hx   . Stomach cancer Neg Hx   . Rectal cancer Neg Hx   . Esophageal cancer Neg Hx     No Known Allergies  Outpatient Medications Prior to Visit  Medication Sig Dispense Refill  . albuterol (PROVENTIL HFA;VENTOLIN HFA) 108 (90 Base) MCG/ACT inhaler Inhale 2 puffs into the lungs every 4 (four) hours as needed for wheezing or shortness of breath. 1 Inhaler 2  . amLODipine (NORVASC) 10 MG tablet Take 1 tablet (10 mg total) by mouth daily. 90 tablet 3  . azithromycin (ZITHROMAX) 250 MG tablet Take 2 today  then 1 daily 6 tablet 0  . benzonatate (TESSALON) 100 MG capsule Take 2 capsules (200 mg total) by mouth 3 (three) times daily as needed for cough. 40 capsule 0  . carvedilol (COREG) 6.25 MG tablet TAKE 1/2 TABLET BY MOUTH 2 TIMES DAILY WITH A MEAL. 30 tablet 2  . fluticasone (FLONASE) 50 MCG/ACT nasal spray Place 2 sprays into both nostrils at bedtime. 16 g 2  . loratadine (CLARITIN) 10 MG tablet Take 1 tablet (10 mg total) by mouth daily. 30 tablet 11  . losartan-hydrochlorothiazide (HYZAAR) 100-25 MG tablet Take 1 tablet by mouth daily. 30 tablet 6  . predniSONE (STERAPRED UNI-PAK 21 TAB) 10 MG (21) TBPK tablet Take by mouth daily. Take 6 tabs by mouth dailyx2 days, then 5 x2 days, then 4 x2 days, then 3 x2 days, 2 tabsx 2 days, then 1 tab x2 days 42 tablet 0  . cyclobenzaprine (FLEXERIL) 10 MG tablet Take 1 tablet (10 mg total) by mouth 2 (two) times daily as needed for muscle spasms. 20 tablet 0   No facility-administered medications prior to visit.      ROS Review of Systems  Constitutional: Negative for activity change, appetite change and fatigue.  HENT: Negative for congestion, sinus pressure and sore throat.   Eyes: Negative for visual disturbance.  Respiratory: Negative for cough, chest tightness, shortness of  breath and wheezing.   Cardiovascular: Negative for chest pain and palpitations.  Gastrointestinal: Negative for abdominal distention, abdominal pain and constipation.  Endocrine: Negative for polydipsia.  Genitourinary: Negative for dysuria and frequency.  Musculoskeletal: Positive for neck pain. Negative for arthralgias and back pain.  Skin: Negative for rash.  Neurological: Positive for numbness. Negative for tremors and light-headedness.  Hematological: Does not bruise/bleed easily.  Psychiatric/Behavioral: Negative for agitation and behavioral problems.    Objective:  BP (!) 143/82   Pulse 70   Temp 98 F (36.7 C) (Oral)   Ht 5' (1.524 m)   Wt 96 lb 9.6 oz  (43.8 kg)   SpO2 99%   BMI 18.87 kg/m   BP/Weight 09/29/2018 09/16/2018 40/34/7425  Systolic BP 956 387 564  Diastolic BP 82 96 75  Wt. (Lbs) 96.6 - 99.6  BMI 18.87 - 19.45      Physical Exam Constitutional:      Appearance: She is well-developed.  Cardiovascular:     Rate and Rhythm: Normal rate.     Heart sounds: Normal heart sounds. No murmur.  Pulmonary:     Effort: Pulmonary effort is normal.     Breath sounds: Normal breath sounds. No wheezing or rales.  Chest:     Chest wall: No tenderness.  Abdominal:     General: Bowel sounds are normal. There is no distension.     Palpations: Abdomen is soft. There is no mass.     Tenderness: There is no abdominal tenderness.  Musculoskeletal: Normal range of motion.     Comments: Tenderness on palpation of C7 Abduction of right upper extremity restricted to 70 degrees Positive impingement signs  Neurological:     Mental Status: She is alert and oriented to person, place, and time.     CMP Latest Ref Rng & Units 04/09/2018 09/16/2017 12/23/2016  Glucose 65 - 99 mg/dL - 90 86  BUN 7 - 25 mg/dL - 16 12  Creatinine 0.57 - 1.00 mg/dL 0.90 0.84 0.82  Sodium 135 - 146 mmol/L - 138 141  Potassium 3.5 - 5.3 mmol/L - 3.7 3.6  Chloride 98 - 110 mmol/L - 106 107  CO2 20 - 32 mmol/L - 25 25  Calcium 8.6 - 10.4 mg/dL - 10.1 10.1  Total Protein 6.1 - 8.1 g/dL - 6.7 6.5  Total Bilirubin 0.2 - 1.2 mg/dL - 0.6 0.5  Alkaline Phos 33 - 130 U/L - - 70  AST 10 - 35 U/L - 16 28  ALT 6 - 29 U/L - 9 39(H)    Lipid Panel     Component Value Date/Time   CHOL 159 09/30/2013 1509   TRIG 127 09/30/2013 1509   HDL 53 09/30/2013 1509   CHOLHDL 3.0 09/30/2013 1509   VLDL 25 09/30/2013 1509   LDLCALC 81 09/30/2013 1509    CBC    Component Value Date/Time   WBC 9.3 11/17/2017 1510   WBC 7.9 12/23/2016 1413   RBC 4.81 11/17/2017 1510   RBC 5.15 (H) 12/23/2016 1413   HGB 14.2 11/17/2017 1510   HCT 42.4 11/17/2017 1510   PLT 282 11/17/2017  1510   MCV 88 11/17/2017 1510   MCH 29.5 11/17/2017 1510   MCH 29.5 12/23/2016 1413   MCHC 33.5 11/17/2017 1510   MCHC 34.2 12/23/2016 1413   RDW 13.4 11/17/2017 1510   LYMPHSABS 2,844 12/23/2016 1413   MONOABS 474 12/23/2016 1413   EOSABS 632 (H) 12/23/2016 1413   BASOSABS 79 12/23/2016  1413    Lab Results  Component Value Date   HGBA1C 5.70 08/01/2015    Assessment & Plan:   1. Cervicalgia Uncontrolled Gabapentin added to regimen, Flexeril refilled Advised to use OTC NSAIDs in addition. - gabapentin (NEURONTIN) 300 MG capsule; Take 1 capsule (300 mg total) by mouth 2 (two) times daily.  Dispense: 60 capsule; Refill: 1 - cyclobenzaprine (FLEXERIL) 10 MG tablet; Take 1 tablet (10 mg total) by mouth at bedtime.  Dispense: 30 tablet; Refill: 0   Meds ordered this encounter  Medications  . gabapentin (NEURONTIN) 300 MG capsule    Sig: Take 1 capsule (300 mg total) by mouth 2 (two) times daily.    Dispense:  60 capsule    Refill:  1  . cyclobenzaprine (FLEXERIL) 10 MG tablet    Sig: Take 1 tablet (10 mg total) by mouth at bedtime.    Dispense:  30 tablet    Refill:  0    Follow-up: Return for follow up of chronic medical conditions, call to schedule appointment with PCP.       Charlott Rakes, MD, FAAFP. [facility] and [place], [place]   09/29/2018, 4:57 PM

## 2018-10-20 MED FILL — LOSARTAN-HCTZ 100-25 MG TAB: 100-25 | 30 days supply | Qty: 30 | Fill #3

## 2018-10-20 MED FILL — CYCLOBENZAPRINE 10 MG TAB: 10 | 30 days supply | Qty: 30 | Fill #0

## 2018-10-20 MED FILL — GABAPENTIN 300 MG CAPSULE: 300 | 30 days supply | Qty: 60 | Fill #0

## 2018-11-18 ENCOUNTER — Other Ambulatory Visit: Payer: Self-pay | Admitting: Internal Medicine

## 2018-11-18 DIAGNOSIS — I10 Essential (primary) hypertension: Secondary | ICD-10-CM

## 2018-11-18 MED FILL — LOSARTAN-HCTZ 100-25 MG TAB: 100-25 | 30 days supply | Qty: 30 | Fill #0

## 2018-11-18 MED FILL — ?CARVEDILOL 6.25 MG TABLET: 6.25 | 30 days supply | Qty: 60 | Fill #0

## 2018-11-19 ENCOUNTER — Other Ambulatory Visit: Payer: Self-pay | Admitting: Internal Medicine

## 2018-11-19 DIAGNOSIS — I10 Essential (primary) hypertension: Secondary | ICD-10-CM

## 2018-12-31 ENCOUNTER — Other Ambulatory Visit: Payer: Self-pay | Admitting: Internal Medicine

## 2018-12-31 DIAGNOSIS — I10 Essential (primary) hypertension: Secondary | ICD-10-CM

## 2019-01-06 ENCOUNTER — Other Ambulatory Visit: Payer: Self-pay | Admitting: Internal Medicine

## 2019-01-06 DIAGNOSIS — I10 Essential (primary) hypertension: Secondary | ICD-10-CM

## 2019-01-06 NOTE — Telephone Encounter (Signed)
Pt is out of bp medications. Pt has made a fu appt. Please call in to [facility].

## 2019-01-07 ENCOUNTER — Ambulatory Visit: Payer: Self-pay | Attending: Internal Medicine | Admitting: Physician Assistant

## 2019-01-07 ENCOUNTER — Other Ambulatory Visit: Payer: Self-pay

## 2019-01-07 ENCOUNTER — Other Ambulatory Visit: Payer: Self-pay | Admitting: Internal Medicine

## 2019-01-07 DIAGNOSIS — J301 Allergic rhinitis due to pollen: Secondary | ICD-10-CM

## 2019-01-07 DIAGNOSIS — I10 Essential (primary) hypertension: Secondary | ICD-10-CM

## 2019-01-07 DIAGNOSIS — R053 Chronic cough: Secondary | ICD-10-CM

## 2019-01-07 DIAGNOSIS — R05 Cough: Secondary | ICD-10-CM

## 2019-01-07 MED ORDER — CARVEDILOL 6.25 MG PO TABS: 6.2500 mg | ORAL_TABLET | Freq: Two times a day (BID) | ORAL | 1 refills | Status: DC

## 2019-01-07 MED ORDER — ALBUTEROL SULFATE HFA 108 (90 BASE) MCG/ACT IN AERS: 2.0000 | INHALATION_SPRAY | RESPIRATORY_TRACT | 1 refills | Status: DC | PRN

## 2019-01-07 MED ORDER — FLUTICASONE PROPIONATE 50 MCG/ACT NA SUSP: 2.0000 | Freq: Every day | NASAL | 2 refills | Status: DC

## 2019-01-07 MED ORDER — LOSARTAN POTASSIUM-HCTZ 100-25 MG PO TABS: 1.0000 | ORAL_TABLET | Freq: Every day | ORAL | 1 refills | Status: DC

## 2019-01-07 MED ORDER — AMLODIPINE BESYLATE 10 MG PO TABS: 10.0000 mg | ORAL_TABLET | Freq: Every day | ORAL | 1 refills | Status: DC

## 2019-01-07 MED FILL — FLUTICASONE PROP 50 MCG SPR: 50 | 30 days supply | Qty: 16 | Fill #0

## 2019-01-07 MED FILL — AMLODIPINE BESYLATE 10 MG T: 10 | 90 days supply | Qty: 90 | Fill #0

## 2019-01-07 MED FILL — AMLODIPINE BESYLATE 10 MG T: 10 | 30 days supply | Qty: 30 | Fill #0

## 2019-01-07 MED FILL — ALBUTEROL SULFATE HFA 108 (: 108 (90 BAS | 16 days supply | Qty: 18 | Fill #0

## 2019-01-07 MED FILL — CARVEDILOL 6.25 MG TABLET: 6.25 | 90 days supply | Qty: 180 | Fill #0

## 2019-01-07 MED FILL — LOSARTAN-HCTZ 100-25 MG TAB: 100-25 | 90 days supply | Qty: 90 | Fill #0

## 2019-01-07 NOTE — Progress Notes (Signed)
Virtual Visit via Telephone Note  I connected with Lori Bush on 01/07/19 at  9:30 AM EDT by telephone and verified that I am speaking with the correct person using two identifiers.   I discussed the limitations, risks, security and privacy concerns of performing an evaluation and management service by telephone and the availability of in person appointments. I also discussed with the patient that there may be a patient responsible charge related to this service. The patient expressed understanding and agreed to proceed.  Patient location:  home My Location:  [place] office Persons on the call:  Me and the patient   History of Present Illness:  Needs BP meds.  She has been out for 1 week.  BP this morning 169/71.  No CP/HA/dizziness.    Also needs RF flonase and albuterol    Observations/Objective:  A&Ox3   Assessment and Plan: 1. Essential hypertension Resume meds.  Check BP OOO 3x/week; make appt sonner than 3 months if does not reach goal once back on meds - amLODipine (NORVASC) 10 MG tablet; Take 1 tablet (10 mg total) by mouth daily.  Dispense: 90 tablet; Refill: 1 - carvedilol (COREG) 6.25 MG tablet; Take 1 tablet (6.25 mg total) by mouth 2 (two) times daily with a meal.  Dispense: 180 tablet; Refill: 1 - losartan-hydrochlorothiazide (HYZAAR) 100-25 MG tablet; Take 1 tablet by mouth daily.  Dispense: 90 tablet; Refill: 1  2. Seasonal allergic rhinitis due to pollen - fluticasone (FLONASE) 50 MCG/ACT nasal spray; Place 2 sprays into both nostrils at bedtime.  Dispense: 16 g; Refill: 2  3. Chronic cough - albuterol (VENTOLIN HFA) 108 (90 Base) MCG/ACT inhaler; Inhale 2 puffs into the lungs every 4 (four) hours as needed for wheezing or shortness of breath.  Dispense: 18 g; Refill: 1    Follow Up Instructions: 3 months with pcp   I discussed the assessment and treatment plan with the patient. The patient was provided an opportunity to ask questions and all were answered.  The patient agreed with the plan and demonstrated an understanding of the instructions.   The patient was advised to call back or seek an in-person evaluation if the symptoms worsen or if the condition fails to improve as anticipated.  I provided 9 minutes of non-face-to-face time during this encounter.   Freeman Caldron, PA-C  Patient ID: Lori Bush, female   DOB: 1960-08-06, 58 y.o.   MRN: [id number]

## 2019-01-07 NOTE — Progress Notes (Signed)
Pt. Is asking for medication refill for blood pressure.  Pt. Stated she ran out of Blood pressure medication since last week.  Pt. Stated her BP reading at home this morning 169/71.

## 2019-02-04 ENCOUNTER — Ambulatory Visit: Payer: Self-pay | Admitting: Internal Medicine

## 2019-04-13 MED FILL — AMLODIPINE BESYLATE 10 MG T: 10 | 90 days supply | Qty: 90 | Fill #1

## 2019-05-11 ENCOUNTER — Encounter: Payer: Self-pay | Admitting: Family Medicine

## 2019-05-11 ENCOUNTER — Ambulatory Visit: Payer: Self-pay | Attending: Family Medicine | Admitting: Family Medicine

## 2019-05-11 ENCOUNTER — Other Ambulatory Visit: Payer: Self-pay

## 2019-05-11 VITALS — BP 150/86 | HR 75 | Temp 98.2°F | Ht 60.0 in | Wt 94.0 lb

## 2019-05-11 DIAGNOSIS — Z720 Tobacco use: Secondary | ICD-10-CM

## 2019-05-11 DIAGNOSIS — R634 Abnormal weight loss: Secondary | ICD-10-CM

## 2019-05-11 DIAGNOSIS — Z1231 Encounter for screening mammogram for malignant neoplasm of breast: Secondary | ICD-10-CM

## 2019-05-11 DIAGNOSIS — I10 Essential (primary) hypertension: Secondary | ICD-10-CM

## 2019-05-11 DIAGNOSIS — N2889 Other specified disorders of kidney and ureter: Secondary | ICD-10-CM

## 2019-05-11 MED ORDER — LOSARTAN POTASSIUM 100 MG PO TABS: 100.0000 mg | ORAL_TABLET | Freq: Every day | ORAL | 1 refills | Status: DC

## 2019-05-11 MED ORDER — CARVEDILOL 12.5 MG PO TABS: 12.5000 mg | ORAL_TABLET | Freq: Two times a day (BID) | ORAL | 1 refills | Status: DC

## 2019-05-11 MED FILL — LOSARTAN POTASSIUM 100 MG T: 100 | 30 days supply | Qty: 30 | Fill #0

## 2019-05-11 MED FILL — CARVEDILOL 12.5 MG TABLET: 12.5 | 30 days supply | Qty: 60 | Fill #0

## 2019-05-11 NOTE — Patient Instructions (Signed)

## 2019-05-11 NOTE — Progress Notes (Signed)
Patient is concerned about her weight loss.

## 2019-05-11 NOTE — Progress Notes (Signed)
Subjective:  Patient ID: Lori Bush, female    DOB: [birth year]  Age: 58 y.o. MRN: [id number]  CC: Hypertension   HPI Lori Bush is a 59 year old female with a history of hypertension, tobacco abuse who presents today with concerns of weight loss and states she has lost about 30 pounds in the last 2 years. Review of her chart indicates a 24-week pound weight loss over 30 months.  She weighed 118 pounds in 10/2016 and weighs 94 pounds today.  Previous PCP notes had indicated she works as a Freight forwarder at [employer] and does not find time to eat.  Today she informs me she takes her food from home as she does not like the food at the restaurant but she  loves eating and has a good appetite. She has always been small and fever and her family are generally small but she would like to gain some weight. She had some dental work done about 6 months ago but states this did not affect her appetite significantly. Up-to-date on Pap smear, colonoscopy was in 01/2018; she is needing a mammogram. She smokes and is not ready to quit yet. CT abdomen and pelvis from 02/2018 revealed presence of left upper pole kidney lesion with recommendations for follow-up MRI however this never happened as the patient states some of her appointments were canceled due to the ongoing pandemic.  Past Medical History:  Diagnosis Date  . Arthritis Dx 2012  . Hypertension Dx 2005    Past Surgical History:  Procedure Laterality Date  . CESAREAN SECTION    . COLONOSCOPY WITH ESOPHAGOGASTRODUODENOSCOPY (EGD)  03/2007  . [place]   as a child  . WISDOM TOOTH EXTRACTION      Family History  Problem Relation Age of Onset  . Hypertension Mother   . Heart disease Mother   . Hypertension Father   . Dementia Father   . Arthritis Sister   . Hypertension Sister   . Arthritis Brother   . Hypertension Brother   . Colon cancer Neg Hx   . Stomach cancer Neg Hx   . Rectal cancer Neg Hx   . Esophageal  cancer Neg Hx     No Known Allergies  Outpatient Medications Prior to Visit  Medication Sig Dispense Refill  . albuterol (VENTOLIN HFA) 108 (90 Base) MCG/ACT inhaler Inhale 2 puffs into the lungs every 4 (four) hours as needed for wheezing or shortness of breath. 18 g 1  . amLODipine (NORVASC) 10 MG tablet Take 1 tablet (10 mg total) by mouth daily. 90 tablet 1  . fluticasone (FLONASE) 50 MCG/ACT nasal spray Place 2 sprays into both nostrils at bedtime. 16 g 2  . loratadine (CLARITIN) 10 MG tablet Take 1 tablet (10 mg total) by mouth daily. 30 tablet 11  . carvedilol (COREG) 6.25 MG tablet Take 1 tablet (6.25 mg total) by mouth 2 (two) times daily with a meal. 180 tablet 1  . losartan-hydrochlorothiazide (HYZAAR) 100-25 MG tablet Take 1 tablet by mouth daily. 90 tablet 1  . benzonatate (TESSALON) 100 MG capsule Take 2 capsules (200 mg total) by mouth 3 (three) times daily as needed for cough. (Patient not taking: Reported on 01/07/2019) 40 capsule 0  . cyclobenzaprine (FLEXERIL) 10 MG tablet Take 1 tablet (10 mg total) by mouth at bedtime. (Patient not taking: Reported on 01/07/2019) 30 tablet 0  . gabapentin (NEURONTIN) 300 MG capsule Take 1 capsule (300 mg total) by mouth 2 (two) times daily. (Patient  not taking: Reported on 01/07/2019) 60 capsule 1   No facility-administered medications prior to visit.      ROS Review of Systems  Constitutional: Negative for activity change, appetite change and fatigue.  HENT: Negative for congestion, sinus pressure and sore throat.   Eyes: Negative for visual disturbance.  Respiratory: Negative for cough, chest tightness, shortness of breath and wheezing.   Cardiovascular: Negative for chest pain and palpitations.  Gastrointestinal: Negative for abdominal distention, abdominal pain and constipation.  Endocrine: Negative for polydipsia.  Genitourinary: Negative for dysuria and frequency.  Musculoskeletal: Negative for arthralgias and back pain.  Skin:  Negative for rash.  Neurological: Negative for tremors, light-headedness and numbness.  Hematological: Does not bruise/bleed easily.  Psychiatric/Behavioral: Negative for agitation and behavioral problems.    Objective:  BP (!) 150/86   Pulse 75   Temp 98.2 F (36.8 C) (Oral)   Ht 5' (1.524 m)   Wt 94 lb (42.6 kg)   SpO2 98%   BMI 18.36 kg/m   BP/Weight 05/11/2019 09/29/2018 99991111  Systolic BP [id number]  Diastolic BP 86 82 96  Wt. (Lbs) 94 96.6 -  BMI 18.36 18.87 -      Physical Exam Constitutional:      Appearance: She is well-developed.     Comments: Thin  Neck:     Vascular: No JVD.  Cardiovascular:     Rate and Rhythm: Normal rate.     Heart sounds: Normal heart sounds. No murmur.  Pulmonary:     Effort: Pulmonary effort is normal.     Breath sounds: Normal breath sounds. No wheezing or rales.  Chest:     Chest wall: No tenderness.  Abdominal:     General: Bowel sounds are normal. There is no distension.     Palpations: Abdomen is soft. There is no mass.     Tenderness: There is no abdominal tenderness.  Musculoskeletal: Normal range of motion.     Right lower leg: No edema.     Left lower leg: No edema.  Neurological:     Mental Status: She is alert and oriented to person, place, and time.  Psychiatric:        Mood and Affect: Mood normal.     CMP Latest Ref Rng & Units 04/09/2018 09/16/2017 12/23/2016  Glucose 65 - 99 mg/dL - 90 86  BUN 7 - 25 mg/dL - 16 12  Creatinine 0.57 - 1.00 mg/dL 0.90 0.84 0.82  Sodium 135 - 146 mmol/L - 138 141  Potassium 3.5 - 5.3 mmol/L - 3.7 3.6  Chloride 98 - 110 mmol/L - 106 107  CO2 20 - 32 mmol/L - 25 25  Calcium 8.6 - 10.4 mg/dL - 10.1 10.1  Total Protein 6.1 - 8.1 g/dL - 6.7 6.5  Total Bilirubin 0.2 - 1.2 mg/dL - 0.6 0.5  Alkaline Phos 33 - 130 U/L - - 70  AST 10 - 35 U/L - 16 28  ALT 6 - 29 U/L - 9 39(H)    Lipid Panel     Component Value Date/Time   CHOL 159 09/30/2013 1509   TRIG 127 09/30/2013 1509    HDL 53 09/30/2013 1509   CHOLHDL 3.0 09/30/2013 1509   VLDL 25 09/30/2013 1509   LDLCALC 81 09/30/2013 1509    CBC    Component Value Date/Time   WBC 9.3 11/17/2017 1510   WBC 7.9 12/23/2016 1413   RBC 4.81 11/17/2017 1510   RBC 5.15 (H) 12/23/2016 1413  HGB 14.2 11/17/2017 1510   HCT 42.4 11/17/2017 1510   PLT 282 11/17/2017 1510   MCV 88 11/17/2017 1510   MCH 29.5 11/17/2017 1510   MCH 29.5 12/23/2016 1413   MCHC 33.5 11/17/2017 1510   MCHC 34.2 12/23/2016 1413   RDW 13.4 11/17/2017 1510   LYMPHSABS 2,844 12/23/2016 1413   MONOABS 474 12/23/2016 1413   EOSABS 632 (H) 12/23/2016 1413   BASOSABS 79 12/23/2016 1413    Lab Results  Component Value Date   HGBA1C 5.70 08/01/2015    Assessment & Plan:   1. Essential hypertension Uncontrolled Increase Coreg dose Switch from Losartan/HCTZ to losartan to eliminate weight loss effect of diuretic Counseled on blood pressure goal of less than 130/80, low-sodium, DASH diet, medication compliance, 150 minutes of moderate intensity exercise per week. Discussed medication compliance, adverse effects. - Basic Metabolic Panel - carvedilol (COREG) 12.5 MG tablet; Take 1 tablet (12.5 mg total) by mouth 2 (two) times daily with a meal.  Dispense: 180 tablet; Refill: 1 - losartan (COZAAR) 100 MG tablet; Take 1 tablet (100 mg total) by mouth daily.  Dispense: 90 tablet; Refill: 1  2. Weight loss Thyroid panel last year was negative We will proceed with malignancy work-up including mammogram Up-to-date on Pap smear and colonoscopy Advised to quit smoking to reduce risk of possible malignancy - T4, free - TSH  3. Tobacco abuse Spent 3 minutes counseling on smoking cessation and she is not ready to quit at this time  4. Other specified disorders of kidney and ureter Left kidney nodule from CT abdomen from last year, MRI recommended - MR Abdomen W Wo Contrast; Future  5. Encounter for screening mammogram for malignant neoplasm of  breast - MM DIGITAL SCREENING BILATERAL; Future    Meds ordered this encounter  Medications  . carvedilol (COREG) 12.5 MG tablet    Sig: Take 1 tablet (12.5 mg total) by mouth 2 (two) times daily with a meal.    Dispense:  180 tablet    Refill:  1    Dose increase  . losartan (COZAAR) 100 MG tablet    Sig: Take 1 tablet (100 mg total) by mouth daily.    Dispense:  90 tablet    Refill:  1    Discontinue losartan/HCTZ    Follow-up: Return in about 1 month (around 06/10/2019) for Follow-up for weight loss with PCP, Dr Wynetta Emery.       Charlott Rakes, MD, FAAFP. [facility], [place]   05/11/2019, 4:17 PM

## 2019-05-12 ENCOUNTER — Other Ambulatory Visit (HOSPITAL_COMMUNITY): Payer: Self-pay | Admitting: *Deleted

## 2019-05-12 DIAGNOSIS — Z1231 Encounter for screening mammogram for malignant neoplasm of breast: Secondary | ICD-10-CM

## 2019-05-12 LAB — BASIC METABOLIC PANEL
BUN/Creatinine Ratio: 19 (ref 9–23)
BUN: 14 mg/dL (ref 6–24)
CO2: 26 mmol/L (ref 20–29)
Calcium: 10.7 mg/dL — ABNORMAL HIGH (ref 8.7–10.2)
Chloride: 105 mmol/L (ref 96–106)
Creatinine, Ser: 0.75 mg/dL (ref 0.57–1.00)
GFR calc Af Amer: 102 mL/min/{1.73_m2} (ref >59)
GFR calc non Af Amer: 88 mL/min/{1.73_m2} (ref >59)
Glucose: 85 mg/dL (ref 65–99)
Potassium: 3.7 mmol/L (ref 3.5–5.2)
Sodium: 141 mmol/L (ref 134–144)

## 2019-05-12 LAB — T4, FREE: Free T4: 0.97 ng/dL (ref 0.82–1.77)

## 2019-05-12 LAB — TSH: TSH: 3.1 u[IU]/mL (ref 0.450–4.500)

## 2019-05-21 ENCOUNTER — Ambulatory Visit (HOSPITAL_COMMUNITY): Payer: Self-pay

## 2019-06-17 ENCOUNTER — Ambulatory Visit (HOSPITAL_COMMUNITY): Payer: Self-pay

## 2019-09-16 MED FILL — LOSARTAN-HCTZ 100-25 MG TAB: 100-25 | 30 days supply | Qty: 30 | Fill #1

## 2019-09-16 MED FILL — CARVEDILOL 12.5 MG TABLET: 12.5 | 30 days supply | Qty: 60 | Fill #1

## 2019-09-17 MED FILL — !VENTOLIN HFA INHALER: 108 (90 BAS | 16 days supply | Qty: 18 | Fill #1

## 2019-11-01 MED FILL — AMLODIPINE BESYLATE 10 MG T: 10 | 30 days supply | Qty: 30 | Fill #0

## 2019-11-01 MED FILL — ?CARVEDILOL 12.5 MG TABLET: 12.5 | 30 days supply | Qty: 60 | Fill #2

## 2019-11-01 MED FILL — ?CARVEDILOL 12.5 MG TABLET: 12.5 | 30 days supply | Qty: 60 | Fill #3

## 2019-11-01 MED FILL — LOSARTAN-HCTZ 100-25 MG TAB: 100-25 | 30 days supply | Qty: 30 | Fill #2

## 2019-11-01 MED FILL — ALBUTEROL SULFATE HFA 108 (: 108 (90 BAS | 16 days supply | Qty: 18 | Fill #1

## 2019-12-29 ENCOUNTER — Other Ambulatory Visit: Payer: Self-pay | Admitting: Internal Medicine

## 2019-12-29 DIAGNOSIS — I10 Essential (primary) hypertension: Secondary | ICD-10-CM

## 2019-12-29 MED FILL — LOSARTAN-HCTZ 100-25 MG TAB: 100-25 | 30 days supply | Qty: 30 | Fill #3

## 2019-12-30 MED FILL — ?AMLODIPINE BESYL 10MG TABL: 10 | 30 days supply | Qty: 30 | Fill #0

## 2020-02-14 ENCOUNTER — Ambulatory Visit: Payer: Self-pay | Admitting: Internal Medicine

## 2020-04-03 ENCOUNTER — Telehealth: Payer: Self-pay | Admitting: Internal Medicine

## 2020-04-03 NOTE — Telephone Encounter (Signed)
Pt was can an schedule a financial appt for next week

## 2020-04-03 NOTE — Telephone Encounter (Signed)
Copied from [place] [phone]. Topic: Appointment Scheduling - Scheduling Inquiry for Clinic >> Apr 03, 2020  8:31 AM Scherrie Gerlach wrote: Reason for CRM: pt would like appt with Clifton James for orange card.  Please call to set appt.

## 2020-04-05 NOTE — Progress Notes (Signed)
Patient ID: Lori Bush, female    DOB: 15-Aug-1960  MRN: [id number]  CC: Hypertension Follow-Up  Subjective: Lori Bush is a 59 y.o. female with history of hypertension, chronic hepatitis C without hepatic coma, primary osteoarthritis of right knee, cyst of skin, tobacco dependence, chronic right shoulder pain, renal mass, Helicobacter pylori, and lung nodules who presents for hypertension follow-up.  1. HYPERTENSION FOLLOW-UP: 05/11/2019: Visit with Dr. Margarita Rana. During that encounter blood pressure uncontrolled. Increased Coreg. Switched Losartan/HCTZ to Losartan to eliminate weight loss effect of diuretic. BMP obtained.   04/06/2020: Currently taking: see medication list Have you taken your blood pressure medication today: [x]  Yes []  No  Med Adherence: [x]  Yes    []  No Medication side effects: []  Yes    [x]  No Adherence with salt restriction: [x]  Yes    []  No Exercise: Yes []  No [x]  Home Monitoring?: []  Yes    []  No Monitoring Frequency: [x]  Yes, sometimes  Home BP results range: [x]  Yes, 140's/90's Smoking [x]  Yes smokes 0.5 pack cigarettes daily, reports she smoked prior to this appointment SOB? []  Yes    [x]  No Chest Pain?: []  Yes    [x]  No Leg swelling?: []  Yes    [x]  No Headaches?: []  Yes    [x]  No Dizziness? [x]  Yes, sometimes   2. WEIGHT LOSS FOLLOW-UP:  05/11/2019:  Visit with Dr. Margarita Rana. During that encounter previous thyroid panel negative. Proceed with malignancy work-up including mammogram. Up-to-date on PAP smear and colonoscopy. Advised to quit smoking to reduce risk of possible malignancy.   04/06/2020: Today reports she is still concerned about not being able to gain weight. Reports she is eating a lot of starchy foods such as pastas, collard greens, yams, seafood, and pork sometimes.  Reports she purchased over-the-counter weight gaining supplements which has been unsuccessful. Reports the issues with losing weight did not begin until around 2019  when she started taking a medication called Harvoni for hepatitis C management. Reports historically her weight has been about 110 pounds. Reports she does seem to think this may be somewhat related to the fact that she has a higher metabolism. Reports overall she feels normal but that she would just like to gain some healthy weight.  3. ALLERGIES FOLLOW-UP: Requests refills for Flonase and Albuterol inhaler. Reports she takes Zyrtec over-the-counter.  Patient Active Problem List   Diagnosis Date Noted  . Renal mass 02/27/2018  . Helicobacter pylori (H. pylori) infection 02/27/2018  . Lung nodule, multiple 02/27/2018  . Tobacco dependence 03/17/2017  . Cyst of skin 03/17/2017  . Primary osteoarthritis of right knee 10/28/2016  . Chronic hepatitis C without hepatic coma ([place]) 03/26/2016  . Chronic right shoulder pain 01/17/2015  . Hypertension 03/05/2013     Current Outpatient Medications on File Prior to Visit  Medication Sig Dispense Refill  . albuterol (VENTOLIN HFA) 108 (90 Base) MCG/ACT inhaler Inhale 2 puffs into the lungs every 4 (four) hours as needed for wheezing or shortness of breath. 18 g 1  . amLODipine (NORVASC) 10 MG tablet Take 1 tablet (10 mg total) by mouth daily. Must have office visit for refills 30 tablet 0  . benzonatate (TESSALON) 100 MG capsule Take 2 capsules (200 mg total) by mouth 3 (three) times daily as needed for cough. (Patient not taking: Reported on 01/07/2019) 40 capsule 0  . carvedilol (COREG) 12.5 MG tablet Take 1 tablet (12.5 mg total) by mouth 2 (two) times daily with a meal. 180 tablet 1  .  cyclobenzaprine (FLEXERIL) 10 MG tablet Take 1 tablet (10 mg total) by mouth at bedtime. (Patient not taking: Reported on 01/07/2019) 30 tablet 0  . fluticasone (FLONASE) 50 MCG/ACT nasal spray Place 2 sprays into both nostrils at bedtime. 16 g 2  . gabapentin (NEURONTIN) 300 MG capsule Take 1 capsule (300 mg total) by mouth 2 (two) times daily. (Patient not taking:  Reported on 01/07/2019) 60 capsule 1  . loratadine (CLARITIN) 10 MG tablet Take 1 tablet (10 mg total) by mouth daily. 30 tablet 11  . losartan (COZAAR) 100 MG tablet Take 1 tablet (100 mg total) by mouth daily. 90 tablet 1   No current facility-administered medications on file prior to visit.    No Known Allergies  Social History   Socioeconomic History  . Marital status: Single    Spouse name: Not on file  . Number of children: 5  . Years of education: Not on file  . Highest education level: Not on file  Occupational History  . Occupation: Publishing copy  Tobacco Use  . Smoking status: Current Every Day Smoker    Packs/day: 0.50    Years: 31.00    Pack years: 15.50    Types: Cigarettes  . Smokeless tobacco: Never Used  . Tobacco comment: states she cut back 3 cigarettes/day  Vaping Use  . Vaping Use: Never used  Substance and Sexual Activity  . Alcohol use: No  . Drug use: No  . Sexual activity: Not on file  Other Topics Concern  . Not on file  Social History Narrative  . Not on file   Social Determinants of Health   Financial Resource Strain:   . Difficulty of Paying Living Expenses: Not on file  Food Insecurity:   . Worried About Charity fundraiser in the Last Year: Not on file  . Ran Out of Food in the Last Year: Not on file  Transportation Needs:   . Lack of Transportation (Medical): Not on file  . Lack of Transportation (Non-Medical): Not on file  Physical Activity:   . Days of Exercise per Week: Not on file  . Minutes of Exercise per Session: Not on file  Stress:   . Feeling of Stress : Not on file  Social Connections:   . Frequency of Communication with Friends and Family: Not on file  . Frequency of Social Gatherings with Friends and Family: Not on file  . Attends Religious Services: Not on file  . Active Member of Clubs or Organizations: Not on file  . Attends Archivist Meetings: Not on file  . Marital Status: Not on file  Intimate  Partner Violence:   . Fear of Current or Ex-Partner: Not on file  . Emotionally Abused: Not on file  . Physically Abused: Not on file  . Sexually Abused: Not on file    Family History  Problem Relation Age of Onset  . Hypertension Mother   . Heart disease Mother   . Hypertension Father   . Dementia Father   . Arthritis Sister   . Hypertension Sister   . Arthritis Brother   . Hypertension Brother   . Colon cancer Neg Hx   . Stomach cancer Neg Hx   . Rectal cancer Neg Hx   . Esophageal cancer Neg Hx     Past Surgical History:  Procedure Laterality Date  . CESAREAN SECTION    . COLONOSCOPY WITH ESOPHAGOGASTRODUODENOSCOPY (EGD)  03/2007  . [place]  as a child  . WISDOM TOOTH EXTRACTION      ROS: Review of Systems Negative except as stated above   PHYSICAL EXAM: Wt Readings from Last 3 Encounters:  04/06/20 93 lb 12.8 oz (42.5 kg)  05/11/19 94 lb (42.6 kg)  09/29/18 96 lb 9.6 oz (43.8 kg)   Vitals with BMI 04/06/2020 04/06/2020 05/11/2019  Height - 5\' 0"  5\' 0"   Weight - 93 lbs 13 oz 94 lbs  BMI - 01.77 93.90  Systolic 300 923 300  Diastolic 762 263 86  Pulse - 68 75   General appearance - alert, well appearing, and in no distress and oriented to person, place, and time Mental status - alert, oriented to person, place, and time, normal mood, behavior, speech, dress, motor activity, and thought processes Neck - supple, no significant adenopathy Lymphatics - no palpable lymphadenopathy, no hepatosplenomegaly Chest - clear to auscultation, no wheezes, rales or rhonchi, symmetric air entry, no tachypnea, retractions or cyanosis Heart - normal rate, regular rhythm, normal S1, S2, no murmurs, rubs, clicks or gallops Neurological - alert, oriented, normal speech, no focal findings or movement disorder noted, neck supple without rigidity, cranial nerves II through XII intact, motor and sensory grossly normal bilaterally, normal muscle tone, no tremors, strength  5/5  ASSESSMENT AND PLAN: 1. Essential hypertension: - Blood pressure not at goal during today's visit. Patient asymptomatic without chest pressure, chest pain, palpitations, and shortness of breath.  - Clonidine 0.1 mg administered today in clinic and repeat blood pressure obtained. Counseled patient that Clonidine may cause some drowsiness. Patient verbalized understanding.  -Counseled patient on potential side effects of medication increase such as but not limited to lightheadedness or dizziness. Should the patient experience any of these symptoms patient should immediately notify provider or seek medical attention immediately if severe. - Patient endorses that home blood pressure reading usually are around 140's/90's. - Continue Losartan and Amlodipine as prescribed.  - Increase Carvedilol from 12.5 mg twice daily to 25 mg twice daily.  - Follow-up with in 4 weeks with clinical pharmacist for blood pressure check. Write down your blood pressure readings each day and bring those results along with your home blood pressure monitor to your appointment. Medications may be adjusted at that time if needed. - Counseled on blood pressure goal of less than 130/80, low-sodium, DASH diet, medication compliance, 150 minutes of moderate intensity exercise per week as tolerated. Discussed medication compliance, adverse effects. - BMP to check kidney function and electrolyte balance.  - Follow-up with primary physician in 3 months or sooner if needed. - Patient was given clear instructions to go to Emergency Department or return to medical center if symptoms don't improve, worsen, or new problems develop.The patient verbalized understanding. - Basic Metabolic Panel - cloNIDine (CATAPRES) tablet 0.1 mg - losartan (COZAAR) 100 MG tablet; Take 1 tablet (100 mg total) by mouth daily.  Dispense: 30 tablet; Refill: 2 - carvedilol (COREG) 25 MG tablet; Take 1 tablet (25 mg total) by mouth 2 (two) times daily with a  meal.  Dispense: 60 tablet; Refill: 1 - amLODipine (NORVASC) 10 MG tablet; Take 1 tablet (10 mg total) by mouth daily. Must have office visit for refills  Dispense: 30 tablet; Refill: 2 - Amb Referral to Clinical Pharmacist  2. Weight loss: - Visit in November 2020. During that encounter previous thyroid panel negative. Proceeded with malignancy work-up including mammogram. Up-to-date on PAP smear and colonoscopy. Advised to quit smoking to reduce risk of possible malignancy.  -  Today reports she is still concerned about not being able to gain weight. Reports she is eating high-fat and high-starch foods without success of gaining weight.  Reports she purchased over-the-counter weight gaining supplements which has been unsuccessful. Reports overall she feels normal but that she would just like to gain some healthy weight. - Patient reports she does feel that Harvoni, the medication which she takes for hepatitis C, is some of the reason for her weight loss as she reports she did not start losing weight until after she began taking the medication. - Upon review thyroid function, kidney function, and liver function were normal in November 2020.   - Overall patient's weight has remained relatively stable since March 2020.  - Counseled patient to try protein shakes and an occasional milkshake to see if this helps with some weight gain.  - Offered patient referral to Medical Nutrition Therapy and patient declined.  - Follow-up with primary physician as needed.  3. Chronic cough: - Continue Albuterol as prescribed. - Today patient reports she does not have a chronic cough anymore but that she does have an occasional cough and requested refill of Albuterol. Reports she has not used inhaler in quite some time but likes to have an inhaler available if needed. - albuterol (VENTOLIN HFA) 108 (90 Base) MCG/ACT inhaler; Inhale 2 puffs into the lungs every 4 (four) hours as needed for wheezing or shortness of  breath.  Dispense: 18 g; Refill: 0  4. Seasonal allergic rhinitis due to pollen: - Continue Fluticasone as prescribed. - Patient reports she does have seasonal allergies and also taking over-the-counter Zyrtec as needed. - fluticasone (FLONASE) 50 MCG/ACT nasal spray; Place 2 sprays into both nostrils at bedtime.  Dispense: 16 g; Refill: 1   Patient was given the opportunity to ask questions.  Patient verbalized understanding of the plan and was able to repeat key elements of the plan. Patient was given clear instructions to go to Emergency Department or return to medical center if symptoms don't improve, worsen, or new problems develop.The patient verbalized understanding.  Camillia Herter, NP

## 2020-04-06 ENCOUNTER — Other Ambulatory Visit: Payer: Self-pay

## 2020-04-06 ENCOUNTER — Other Ambulatory Visit: Payer: Self-pay | Admitting: Family

## 2020-04-06 ENCOUNTER — Encounter: Payer: Self-pay | Admitting: Family

## 2020-04-06 ENCOUNTER — Ambulatory Visit: Payer: Self-pay | Attending: Internal Medicine | Admitting: Family

## 2020-04-06 VITALS — BP 208/108 | HR 68 | Resp 16 | Ht 60.0 in | Wt 93.8 lb

## 2020-04-06 DIAGNOSIS — R05 Cough: Secondary | ICD-10-CM

## 2020-04-06 DIAGNOSIS — R053 Chronic cough: Secondary | ICD-10-CM

## 2020-04-06 DIAGNOSIS — R634 Abnormal weight loss: Secondary | ICD-10-CM

## 2020-04-06 DIAGNOSIS — I10 Essential (primary) hypertension: Secondary | ICD-10-CM

## 2020-04-06 DIAGNOSIS — J301 Allergic rhinitis due to pollen: Secondary | ICD-10-CM

## 2020-04-06 MED ORDER — LOSARTAN POTASSIUM 100 MG PO TABS: 100.0000 mg | ORAL_TABLET | Freq: Every day | ORAL | 2 refills | Status: DC

## 2020-04-06 MED ORDER — CARVEDILOL 25 MG PO TABS: 25.0000 mg | ORAL_TABLET | Freq: Two times a day (BID) | ORAL | 1 refills | Status: DC

## 2020-04-06 MED ORDER — CLONIDINE HCL 0.1 MG PO TABS
0.1000 mg | ORAL_TABLET | Freq: Once | ORAL | Status: AC
  Administered 2020-04-06: 0.1 mg via ORAL

## 2020-04-06 MED ORDER — FLUTICASONE PROPIONATE 50 MCG/ACT NA SUSP: 2.0000 | Freq: Every day | NASAL | 1 refills | Status: DC

## 2020-04-06 MED ORDER — AMLODIPINE BESYLATE 10 MG PO TABS: 10.0000 mg | ORAL_TABLET | Freq: Every day | ORAL | 2 refills | Status: DC

## 2020-04-06 MED ORDER — ALBUTEROL SULFATE HFA 108 (90 BASE) MCG/ACT IN AERS: 2.0000 | INHALATION_SPRAY | RESPIRATORY_TRACT | 0 refills | Status: DC | PRN

## 2020-04-06 MED FILL — PROVENTIL HFA 108 (90 BASE): 108 (90 BAS | 16 days supply | Qty: 7 | Fill #0

## 2020-04-06 MED FILL — FLUTICASONE PROP 50 MCG SPR: 50 | 30 days supply | Qty: 16 | Fill #0

## 2020-04-06 MED FILL — AMLODIPINE BESYLATE 10 MG T: 10 | 30 days supply | Qty: 30 | Fill #0

## 2020-04-06 MED FILL — LOSARTAN POTASSIUM 100 MG T: 100 | 30 days supply | Qty: 30 | Fill #0

## 2020-04-06 MED FILL — CARVEDILOL 25 MG TABLET: 25 | 30 days supply | Qty: 60 | Fill #0

## 2020-04-06 NOTE — Patient Instructions (Addendum)
Continue Amlodipine and Losartan for high blood pressure. Increase Carvedilol for high blood pressure. Return in 1 month for blood pressure check with clinical pharmacist.  Follow-up with primary physician in 3 months or sooner if needed for chronic management of blood pressure. Lab today.   Hypertension, Adult Hypertension is another name for high blood pressure. High blood pressure forces your heart to work harder to pump blood. This can cause problems over time. There are two numbers in a blood pressure reading. There is a top number (systolic) over a bottom number (diastolic). It is best to have a blood pressure that is below 120/80. Healthy choices can help lower your blood pressure, or you may need medicine to help lower it. What are the causes? The cause of this condition is not known. Some conditions may be related to high blood pressure. What increases the risk?  Smoking.  Having type 2 diabetes mellitus, high cholesterol, or both.  Not getting enough exercise or physical activity.  Being overweight.  Having too much fat, sugar, calories, or salt (sodium) in your diet.  Drinking too much alcohol.  Having long-term (chronic) kidney disease.  Having a family history of high blood pressure.  Age. Risk increases with age.  Race. You may be at higher risk if you are African American.  Gender. Men are at higher risk than women before age 80. After age 54, women are at higher risk than men.  Having obstructive sleep apnea.  Stress. What are the signs or symptoms?  High blood pressure may not cause symptoms. Very high blood pressure (hypertensive crisis) may cause: ? Headache. ? Feelings of worry or nervousness (anxiety). ? Shortness of breath. ? Nosebleed. ? A feeling of being sick to your stomach (nausea). ? Throwing up (vomiting). ? Changes in how you see. ? Very bad chest pain. ? Seizures. How is this treated?  This condition is treated by making healthy  lifestyle changes, such as: ? Eating healthy foods. ? Exercising more. ? Drinking less alcohol.  Your health care provider may prescribe medicine if lifestyle changes are not enough to get your blood pressure under control, and if: ? Your top number is above 130. ? Your bottom number is above 80.  Your personal target blood pressure may vary. Follow these instructions at home: Eating and drinking   If told, follow the DASH eating plan. To follow this plan: ? Fill one half of your plate at each meal with fruits and vegetables. ? Fill one fourth of your plate at each meal with whole grains. Whole grains include whole-wheat pasta, brown rice, and whole-grain bread. ? Eat or drink low-fat dairy products, such as skim milk or low-fat yogurt. ? Fill one fourth of your plate at each meal with low-fat (lean) proteins. Low-fat proteins include fish, chicken without skin, eggs, beans, and tofu. ? Avoid fatty meat, cured and processed meat, or chicken with skin. ? Avoid pre-made or processed food.  Eat less than 1,500 mg of salt each day.  Do not drink alcohol if: ? Your doctor tells you not to drink. ? You are pregnant, may be pregnant, or are planning to become pregnant.  If you drink alcohol: ? Limit how much you use to:  0-1 drink a day for women.  0-2 drinks a day for men. ? Be aware of how much alcohol is in your drink. In the [place] one drink equals one 12 oz bottle of beer (355 mL), one 5 oz glass of wine (148  mL), or one 1 oz glass of hard liquor (44 mL). Lifestyle   Work with your doctor to stay at a healthy weight or to lose weight. Ask your doctor what the best weight is for you.  Get at least 30 minutes of exercise most days of the week. This may include walking, swimming, or biking.  Get at least 30 minutes of exercise that strengthens your muscles (resistance exercise) at least 3 days a week. This may include lifting weights or doing Pilates.  Do not use any products  that contain nicotine or tobacco, such as cigarettes, e-cigarettes, and chewing tobacco. If you need help quitting, ask your doctor.  Check your blood pressure at home as told by your doctor.  Keep all follow-up visits as told by your doctor. This is important. Medicines  Take over-the-counter and prescription medicines only as told by your doctor. Follow directions carefully.  Do not skip doses of blood pressure medicine. The medicine does not work as well if you skip doses. Skipping doses also puts you at risk for problems.  Ask your doctor about side effects or reactions to medicines that you should watch for. Contact a doctor if you:  Think you are having a reaction to the medicine you are taking.  Have headaches that keep coming back (recurring).  Feel dizzy.  Have swelling in your ankles.  Have trouble with your vision. Get help right away if you:  Get a very bad headache.  Start to feel mixed up (confused).  Feel weak or numb.  Feel faint.  Have very bad pain in your: ? Chest. ? Belly (abdomen).  Throw up more than once.  Have trouble breathing. Summary  Hypertension is another name for high blood pressure.  High blood pressure forces your heart to work harder to pump blood.  For most people, a normal blood pressure is less than 120/80.  Making healthy choices can help lower blood pressure. If your blood pressure does not get lower with healthy choices, you may need to take medicine. This information is not intended to replace advice given to you by your health care provider. Make sure you discuss any questions you have with your health care provider. Document Revised: 03/04/2018 Document Reviewed: 03/04/2018 Elsevier Patient Education  2020 [employer].

## 2020-04-07 ENCOUNTER — Telehealth: Payer: Self-pay

## 2020-04-07 LAB — BASIC METABOLIC PANEL
BUN/Creatinine Ratio: 19 (ref 9–23)
BUN: 15 mg/dL (ref 6–24)
CO2: 25 mmol/L (ref 20–29)
Calcium: 10.4 mg/dL — ABNORMAL HIGH (ref 8.7–10.2)
Chloride: 101 mmol/L (ref 96–106)
Creatinine, Ser: 0.77 mg/dL (ref 0.57–1.00)
GFR calc Af Amer: 98 mL/min/{1.73_m2} (ref >59)
GFR calc non Af Amer: 85 mL/min/{1.73_m2} (ref >59)
Glucose: 82 mg/dL (ref 65–99)
Potassium: 3.7 mmol/L (ref 3.5–5.2)
Sodium: 138 mmol/L (ref 134–144)

## 2020-04-07 NOTE — Progress Notes (Signed)
Please call patient with update. Kidney function normal.   Calcium slightly elevated but improved since last visit 11 months ago. Will recheck at next visit with primary physician.

## 2020-04-07 NOTE — Telephone Encounter (Signed)
Contacted pt to go over lab results pt is aware and doesn't have any questions or concerns 

## 2020-04-10 ENCOUNTER — Other Ambulatory Visit: Payer: Self-pay

## 2020-04-10 ENCOUNTER — Ambulatory Visit: Payer: Self-pay | Attending: Internal Medicine

## 2020-04-25 ENCOUNTER — Telehealth: Payer: Self-pay | Admitting: Internal Medicine

## 2020-04-25 NOTE — Telephone Encounter (Signed)
Pt was sent a letter from financial dept. Inform them, that the application they submitted was incomplete, since they were missing some documentation at the time of the appointment, Pt need to reschedule and resubmit all new papers and application for CAFA and OC, P.S. old documents has been sent back by mail to the Pt and Pt. need to make a new appt. 

## 2020-05-08 ENCOUNTER — Ambulatory Visit: Payer: Self-pay | Admitting: Pharmacist

## 2020-05-22 MED FILL — CARVEDILOL 25 MG TABLET: 25 | 30 days supply | Qty: 60 | Fill #1

## 2020-05-22 MED FILL — AMLODIPINE BESYLATE 10 MG T: 10 | 30 days supply | Qty: 30 | Fill #1

## 2020-05-22 MED FILL — LOSARTAN POTASSIUM 100 MG T: 100 | 30 days supply | Qty: 30 | Fill #1

## 2020-07-11 ENCOUNTER — Ambulatory Visit: Payer: Self-pay | Admitting: Internal Medicine

## 2020-07-25 MED FILL — AMLODIPINE BESYLATE 10 MG T: 10 | 30 days supply | Qty: 30 | Fill #2

## 2020-07-25 MED FILL — LOSARTAN POTASSIUM 100 MG T: 100 | 30 days supply | Qty: 30 | Fill #2

## 2020-08-01 MED FILL — LOSARTAN POTASSIUM 100 MG T: 100 | 30 days supply | Qty: 30 | Fill #2

## 2020-08-01 MED FILL — AMLODIPINE BESYLATE 10 MG T: 10 | 30 days supply | Qty: 30 | Fill #2

## 2020-09-12 ENCOUNTER — Other Ambulatory Visit: Payer: Self-pay | Admitting: Family

## 2020-09-12 DIAGNOSIS — I10 Essential (primary) hypertension: Secondary | ICD-10-CM

## 2020-09-18 NOTE — Telephone Encounter (Signed)
Notes to clinic: Patient would like to have filled until appt on 11/13/2020 and if not she would like to be seen sooner Please advise    Requested Prescriptions  Pending Prescriptions Disp Refills   amLODipine (NORVASC) 10 MG tablet 30 tablet 2    Sig: Take 1 tablet (10 mg total) by mouth daily. Must have office visit for refills      Cardiovascular:  Calcium Channel Blockers Failed - 09/18/2020  9:56 AM      Failed - Last BP in normal range    BP Readings from Last 1 Encounters:  04/06/20 (!) 208/108          Passed - Valid encounter within last 6 months    Recent Outpatient Visits           5 months ago Essential hypertension   [place], [place], NP   1 year ago Weight loss   [place], MD   1 year ago Essential hypertension   [place], [place], [place]   1 year ago [place], [place], MD   2 years ago Upper respiratory tract infection, unspecified type   [place], [place], [place]       Future Appointments             In 1 month Ladell Pier, MD [place]               losartan (COZAAR) 100 MG tablet 30 tablet 2    Sig: Take 1 tablet (100 mg total) by mouth daily.      Cardiovascular:  Angiotensin Receptor Blockers Failed - 09/18/2020  9:56 AM      Failed - Last BP in normal range    BP Readings from Last 1 Encounters:  04/06/20 (!) 208/108          Passed - Cr in normal range and within 180 days    Creat  Date Value Ref Range Status  09/16/2017 0.84 0.50 - 1.05 mg/dL Final    Comment:    For patients >17 years of age, the reference limit for Creatinine is approximately 13% higher for people identified as African-American. .    Creatinine, Ser  Date Value Ref Range Status  04/06/2020 0.77 0.57 -  1.00 mg/dL Final          Passed - K in normal range and within 180 days    Potassium  Date Value Ref Range Status  04/06/2020 3.7 3.5 - 5.2 mmol/L Final          Passed - Patient is not pregnant      Passed - Valid encounter within last 6 months    Recent Outpatient Visits           5 months ago Essential hypertension   [place], [place], NP   1 year ago Weight loss   [place], [place], MD   1 year ago Essential hypertension   [place], [place]   1 year ago [place], [place], MD   2 years ago Upper respiratory tract infection, unspecified type   [place], [place],  PA-C       Future Appointments             In 1 month Ladell Pier, MD [place]              Refused Prescriptions Disp Refills   losartan (COZAAR) 100 MG tablet [Pharmacy Med Name: LOSARTAN POTASSIUM 100 MG T 100 Tablet] 30 tablet 2    Sig: TAKE 1 TABLET (100 MG TOTAL) BY MOUTH DAILY.      Cardiovascular:  Angiotensin Receptor Blockers Failed - 09/18/2020  9:56 AM      Failed - Last BP in normal range    BP Readings from Last 1 Encounters:  04/06/20 (!) 208/108          Passed - Cr in normal range and within 180 days    Creat  Date Value Ref Range Status  09/16/2017 0.84 0.50 - 1.05 mg/dL Final    Comment:    For patients >21 years of age, the reference limit for Creatinine is approximately 13% higher for people identified as African-American. .    Creatinine, Ser  Date Value Ref Range Status  04/06/2020 0.77 0.57 - 1.00 mg/dL Final          Passed - K in normal range and within 180 days    Potassium  Date Value Ref Range Status  04/06/2020 3.7 3.5 - 5.2 mmol/L Final          Passed - Patient is not pregnant      Passed  - Valid encounter within last 6 months    Recent Outpatient Visits           5 months ago Essential hypertension   [place], [place], NP   1 year ago Weight loss   [place], [place], MD   1 year ago Essential hypertension   [place], [place]   1 year ago [place], [place], MD   2 years ago Upper respiratory tract infection, unspecified type   [place], [place]       Future Appointments             In 1 month Ladell Pier, MD [place]               amLODipine (NORVASC) 10 MG tablet [Pharmacy Med Name: AMLODIPINE BESYLATE 10 MG T 10 Tablet] 30 tablet 2    Sig: TAKE 1 TABLET (10 MG TOTAL) BY MOUTH DAILY. MUST HAVE OFFICE VISIT FOR REFILLS      Cardiovascular:  Calcium Channel Blockers Failed - 09/18/2020  9:56 AM      Failed - Last BP in normal range    BP Readings from Last 1 Encounters:  04/06/20 (!) 208/108          Passed - Valid encounter within last 6 months    Recent Outpatient Visits           5 months ago Essential hypertension   [place], [place], NP   1 year ago Weight loss   [place], MD   1 year ago Essential hypertension   [place], [place], [place]   1  year ago [place], [place], MD   2 years ago Upper respiratory tract infection, unspecified type   [place], [place], [place]       Future Appointments             In 1 month Wynetta Emery, Dalbert [place], MD [place]

## 2020-09-18 NOTE — Telephone Encounter (Addendum)
Relation to pt: self  Call back number:  [phone]  Pharmacy:  [place], [place] [facility] Phone:  [phone]  Fax:  [phone]       Reason for call:   Patient requesting amLODipine (NORVASC) 10 MG tablet and losartan (COZAAR) 100 MG tablet to hold her over until 11/13/2020 follow up appointment. If unable to refill medication patient would like to be worked in sooner.

## 2020-09-18 NOTE — Addendum Note (Signed)
Addended by: Marijo Conception on: 09/18/2020 09:56 AM   Modules accepted: Orders

## 2020-09-19 ENCOUNTER — Other Ambulatory Visit: Payer: Self-pay | Admitting: Internal Medicine

## 2020-09-19 MED ORDER — LOSARTAN POTASSIUM 100 MG PO TABS: 100.0000 mg | ORAL_TABLET | Freq: Every day | ORAL | 2 refills | Status: DC

## 2020-09-19 MED ORDER — AMLODIPINE BESYLATE 10 MG PO TABS: 10.0000 mg | ORAL_TABLET | Freq: Every day | ORAL | 2 refills | Status: DC

## 2020-09-19 NOTE — Addendum Note (Signed)
Addended by: Daisy Blossom, Annie Main L on: 09/19/2020 05:24 PM   Modules accepted: Orders

## 2020-11-13 ENCOUNTER — Ambulatory Visit: Payer: 59 | Attending: Internal Medicine | Admitting: Internal Medicine

## 2020-11-13 ENCOUNTER — Other Ambulatory Visit: Payer: Self-pay

## 2020-11-13 ENCOUNTER — Encounter: Payer: Self-pay | Admitting: Internal Medicine

## 2020-11-13 VITALS — BP 157/86 | HR 75 | Ht 60.0 in | Wt 102.0 lb

## 2020-11-13 DIAGNOSIS — I10 Essential (primary) hypertension: Secondary | ICD-10-CM

## 2020-11-13 DIAGNOSIS — R918 Other nonspecific abnormal finding of lung field: Secondary | ICD-10-CM

## 2020-11-13 DIAGNOSIS — F17200 Nicotine dependence, unspecified, uncomplicated: Secondary | ICD-10-CM | POA: Diagnosis not present

## 2020-11-13 DIAGNOSIS — N289 Disorder of kidney and ureter, unspecified: Secondary | ICD-10-CM

## 2020-11-13 DIAGNOSIS — M546 Pain in thoracic spine: Secondary | ICD-10-CM | POA: Diagnosis not present

## 2020-11-13 DIAGNOSIS — J301 Allergic rhinitis due to pollen: Secondary | ICD-10-CM

## 2020-11-13 DIAGNOSIS — R053 Chronic cough: Secondary | ICD-10-CM

## 2020-11-13 DIAGNOSIS — R69 Illness, unspecified: Secondary | ICD-10-CM | POA: Diagnosis not present

## 2020-11-13 DIAGNOSIS — Z1231 Encounter for screening mammogram for malignant neoplasm of breast: Secondary | ICD-10-CM

## 2020-11-13 MED ORDER — AMLODIPINE BESYLATE 10 MG PO TABS
10.0000 mg | ORAL_TABLET | Freq: Every day | ORAL | 3 refills | Status: DC
  Filled 2020-11-13: qty 30, 30d supply, fill #0

## 2020-11-13 MED ORDER — CYCLOBENZAPRINE HCL 10 MG PO TABS
5.0000 mg | ORAL_TABLET | Freq: Two times a day (BID) | ORAL | 0 refills | Status: DC | PRN
Start: 2020-11-13 — End: 2021-05-22
  Filled 2020-11-13: qty 30, 30d supply, fill #0

## 2020-11-13 MED ORDER — LORATADINE 10 MG PO TABS
10.0000 mg | ORAL_TABLET | Freq: Every day | ORAL | 11 refills | Status: DC
  Filled 2020-11-13: qty 30, 30d supply, fill #0

## 2020-11-13 MED ORDER — LOSARTAN POTASSIUM 100 MG PO TABS
ORAL_TABLET | Freq: Every day | ORAL | 3 refills | Status: DC
  Filled 2020-11-13: qty 30, 30d supply, fill #0

## 2020-11-13 MED ORDER — HYDROCHLOROTHIAZIDE 12.5 MG PO CAPS
12.5000 mg | ORAL_CAPSULE | Freq: Every day | ORAL | 3 refills | Status: DC
  Filled 2020-11-13: qty 30, 30d supply, fill #0

## 2020-11-13 MED ORDER — ALBUTEROL SULFATE HFA 108 (90 BASE) MCG/ACT IN AERS
2.0000 | INHALATION_SPRAY | RESPIRATORY_TRACT | 0 refills | Status: DC | PRN
Start: 2020-11-13 — End: 2021-06-07
  Filled 2020-11-13: qty 18, 16d supply, fill #0

## 2020-11-13 MED ORDER — NICOTINE 21 MG/24HR TD PT24
21.0000 mg | MEDICATED_PATCH | Freq: Every day | TRANSDERMAL | 0 refills | Status: DC
Start: 2020-11-13 — End: 2021-11-29
  Filled 2020-11-13: qty 28, 28d supply, fill #0

## 2020-11-13 MED ORDER — NICOTINE POLACRILEX 2 MG MT GUM
2.0000 mg | CHEWING_GUM | OROMUCOSAL | 0 refills | Status: DC | PRN
  Filled 2020-11-13: qty 100, fill #0

## 2020-11-13 MED ORDER — CARVEDILOL 25 MG PO TABS
ORAL_TABLET | Freq: Two times a day (BID) | ORAL | 3 refills | Status: DC
  Filled 2020-11-13: qty 60, 30d supply, fill #0
  Filled 2021-03-02: qty 60, 30d supply, fill #1
  Filled 2021-06-04: qty 60, 30d supply, fill #2
  Filled 2021-09-17: qty 60, 30d supply, fill #0
  Filled 2021-09-17: qty 60, 30d supply, fill #3

## 2020-11-13 NOTE — Progress Notes (Signed)
Pain in left side. Needs medication refill.

## 2020-11-13 NOTE — Patient Instructions (Addendum)
Your blood pressure is not controlled.  We have added a low-dose of a medication called hydrochlorothiazide to try to get the blood pressure better.  After you have been on this medicine for 1 week, please return to the lab to have blood test done.  We will also have you follow-up with the clinical pharmacist in 1 week for repeat blood pressure check.  I have sent a prescription to the pharmacy for the nicotine patch and gum.  Please set a quit date.  You should not smoke while you have the patch on.  Purchase and use some Voltaren gel over-the-counter for the pain on the left side of your back.

## 2020-11-13 NOTE — Progress Notes (Signed)
Patient ID: Lori Bush, female    DOB: 1960/08/12  MRN: [id number]  CC: Hypertension   Subjective: Lori Bush is a 60 y.o. female who presents for chronic ds management Her concerns today include:  Hx of HTN, hep Ccured withHarvoni,tobacco dep, wgh loss.  Wgh:  Gained 9 lbs since last visit.  She is quite pleased about this as she has been wanting to gain weight.  HYPERTENSION Currently taking: see medication list.  On carvedilol, amlodipine and Cozaar.  She has taken medicines already for today. Med Adherence: [x]  Yes    []  No Medication side effects: []  Yes    [x]  No Adherence with salt restriction: [x]  Yes    []  No Home Monitoring?: [x]  Yes    []  No Monitoring Frequency: []  Yes    []  No Home BP results range: 140s/70-80s SOB? []  Yes    [x]  No Chest Pain?: []  Yes    [x]  No Leg swelling?: []  Yes    [x]  No Headaches?: []  Yes    []  No Dizziness? []  Yes    [x]  No Comments:   Seasonal allergies: bothers her about 2 x a wk where she has to use Albuterol for the cough.  Out of Claritin.   Tob dep: Smoking more over past 2 yrs.  At 1/2 a pk/day.  "Some days it just calm's me down."  . Loss both or her children's fathers in past 2 yrs and loss family members to [place]. -feels she is mentally ready to quit. Wants to be around longer for her grandkids. -Smoke since age 75.  Smoked on and off for at least 25 yrs.  Always under 1 pk a day  Complains of a soreness on her left side x1 week.  Constant but worse with certain movements.  She now works at [employer] and does a little lifting and wonders whether that may be playing a role.  She has been using heat and ice over the past week but it has not gotten better.  No shortness of breath or cough.  No dysuria or polyuria.  Had CAT scan of the abdomen done back in 2019.  Was noted to have lung nodules and a lesion on the left kidney.  Radiologist recommend follow-up CAT scan of the chest in about 1 year if patient at high  risk.  Also recommended MRI of the abdomen with focus on the left kidney to further define the lesion there.  Patient states she never followed up with getting the MRI because of the [place] pandemic.  HM:  Due for MMG and PAP  Patient Active Problem List   Diagnosis Date Noted  . Renal mass 02/27/2018  . Helicobacter pylori (H. pylori) infection 02/27/2018  . Lung nodule, multiple 02/27/2018  . Tobacco dependence 03/17/2017  . Cyst of skin 03/17/2017  . Primary osteoarthritis of right knee 10/28/2016  . Chronic hepatitis C without hepatic coma ([place]) 03/26/2016  . Chronic right shoulder pain 01/17/2015  . Hypertension 03/05/2013     Current Outpatient Medications on File Prior to Visit  Medication Sig Dispense Refill  . albuterol (VENTOLIN HFA) 108 (90 Base) MCG/ACT inhaler Inhale 2 puffs into the lungs every 4 (four) hours as needed for wheezing or shortness of breath. 18 g 0  . amLODipine (NORVASC) 10 MG tablet TAKE 1 TABLET (10 MG TOTAL) BY MOUTH DAILY. MUST HAVE OFFICE VISIT FOR REFILLS (Patient taking differently: Take by mouth daily. Mush have office visit for refills) 30 tablet  2  . carvedilol (COREG) 25 MG tablet TAKE 1 TABLET (25 MG TOTAL) BY MOUTH 2 (TWO) TIMES DAILY WITH A MEAL. 60 tablet 1  . fluticasone (FLONASE) 50 MCG/ACT nasal spray Place 2 sprays into both nostrils at bedtime. 16 g 1  . losartan (COZAAR) 100 MG tablet TAKE 1 TABLET (100 MG TOTAL) BY MOUTH DAILY. 30 tablet 2  . benzonatate (TESSALON) 100 MG capsule Take 2 capsules (200 mg total) by mouth 3 (three) times daily as needed for cough. (Patient not taking: No sig reported) 40 capsule 0  . cyclobenzaprine (FLEXERIL) 10 MG tablet Take 1 tablet (10 mg total) by mouth at bedtime. (Patient not taking: No sig reported) 30 tablet 0  . gabapentin (NEURONTIN) 300 MG capsule Take 1 capsule (300 mg total) by mouth 2 (two) times daily. (Patient not taking: No sig reported) 60 capsule 1  . loratadine (CLARITIN) 10 MG tablet  Take 1 tablet (10 mg total) by mouth daily. (Patient not taking: Reported on 11/13/2020) 30 tablet 11   No current facility-administered medications on file prior to visit.    No Known Allergies  Social History   Socioeconomic History  . Marital status: Single    Spouse name: Not on file  . Number of children: 5  . Years of education: Not on file  . Highest education level: Not on file  Occupational History  . Occupation: Publishing copy  Tobacco Use  . Smoking status: Current Every Day Smoker    Packs/day: 0.50    Years: 31.00    Pack years: 15.50    Types: Cigarettes  . Smokeless tobacco: Never Used  . Tobacco comment: states she cut back 3 cigarettes/day  Vaping Use  . Vaping Use: Never used  Substance and Sexual Activity  . Alcohol use: No  . Drug use: No  . Sexual activity: Not on file  Other Topics Concern  . Not on file  Social History Narrative  . Not on file   Social Determinants of Health   Financial Resource Strain: Not on file  Food Insecurity: Not on file  Transportation Needs: Not on file  Physical Activity: Not on file  Stress: Not on file  Social Connections: Not on file  Intimate Partner Violence: Not on file    Family History  Problem Relation Age of Onset  . Hypertension Mother   . Heart disease Mother   . Hypertension Father   . Dementia Father   . Arthritis Sister   . Hypertension Sister   . Arthritis Brother   . Hypertension Brother   . Colon cancer Neg Hx   . Stomach cancer Neg Hx   . Rectal cancer Neg Hx   . Esophageal cancer Neg Hx     Past Surgical History:  Procedure Laterality Date  . CESAREAN SECTION    . COLONOSCOPY WITH ESOPHAGOGASTRODUODENOSCOPY (EGD)  03/2007  . [place]   as a child  . WISDOM TOOTH EXTRACTION      ROS: Review of Systems Negative except as stated above  PHYSICAL EXAM: BP (!) 181/98   Pulse 75   Ht 5' (1.524 m)   Wt 102 lb (46.3 kg)   SpO2 97%   BMI 19.92 kg/m   Wt Readings  from Last 3 Encounters:  11/13/20 102 lb (46.3 kg)  04/06/20 93 lb 12.8 oz (42.5 kg)  05/11/19 94 lb (42.6 kg)    Physical Exam  General appearance - alert, well appearing, and in no  distress Mental status - normal mood, behavior, speech, dress, motor activity, and thought processes Neck - supple, no significant adenopathy Chest -breath sounds are slightly decreased but equal bilaterally.  No crackles or wheezes heard.  heart - normal rate, regular rhythm, normal S1, S2, no murmurs, rubs, clicks or gallops Extremities - peripheral pulses normal, no pedal edema, no clubbing or cyanosis MSK: Mild tenderness on palpation of the left posterior lateral rib cage below the scapula.  No tenderness on palpation of the thoracic spine or thoracic paraspinal muscles.    CMP Latest Ref Rng & Units 04/06/2020 05/11/2019 04/09/2018  Glucose 65 - 99 mg/dL 82 85 -  BUN 6 - 24 mg/dL 15 14 -  Creatinine 0.57 - 1.00 mg/dL 0.77 0.75 0.90  Sodium 134 - 144 mmol/L 138 141 -  Potassium 3.5 - 5.2 mmol/L 3.7 3.7 -  Chloride 96 - 106 mmol/L 101 105 -  CO2 20 - 29 mmol/L 25 26 -  Calcium 8.7 - 10.2 mg/dL 10.4(H) 10.7(H) -  Total Protein 6.1 - 8.1 g/dL - - -  Total Bilirubin 0.2 - 1.2 mg/dL - - -  Alkaline Phos 33 - 130 U/L - - -  AST 10 - 35 U/L - - -  ALT 6 - 29 U/L - - -   Lipid Panel     Component Value Date/Time   CHOL 159 09/30/2013 1509   TRIG 127 09/30/2013 1509   HDL 53 09/30/2013 1509   CHOLHDL 3.0 09/30/2013 1509   VLDL 25 09/30/2013 1509   LDLCALC 81 09/30/2013 1509    CBC    Component Value Date/Time   WBC 9.3 11/17/2017 1510   WBC 7.9 12/23/2016 1413   RBC 4.81 11/17/2017 1510   RBC 5.15 (H) 12/23/2016 1413   HGB 14.2 11/17/2017 1510   HCT 42.4 11/17/2017 1510   PLT 282 11/17/2017 1510   MCV 88 11/17/2017 1510   MCH 29.5 11/17/2017 1510   MCH 29.5 12/23/2016 1413   MCHC 33.5 11/17/2017 1510   MCHC 34.2 12/23/2016 1413   RDW 13.4 11/17/2017 1510   LYMPHSABS 2,844 12/23/2016 1413    MONOABS 474 12/23/2016 1413   EOSABS 632 (H) 12/23/2016 1413   BASOSABS 79 12/23/2016 1413    ASSESSMENT AND PLAN: 1. Essential hypertension Not at goal on highest dose of 3 antihypertensive.  She was on diuretic in the past but did not like it because she was trying to gain weight.  I advised at this time that our next move to improve the blood pressure would be to try low-dose of an antihypertensive that is a diuretic.  Patient willing to give it a try.  Recommend that she return to the lab after she has been on hydrochlorothiazide for 1 week for blood test including chemistry.  Follow-up with clinical pharmacist in 1 week for repeat blood pressure check. - CBC; Future - Comprehensive metabolic panel; Future - Lipid panel; Future - hydrochlorothiazide (MICROZIDE) 12.5 MG capsule; Take 1 capsule (12.5 mg total) by mouth daily.  Dispense: 30 capsule; Refill: 3 - carvedilol (COREG) 25 MG tablet; TAKE 1 TABLET (25 MG TOTAL) BY MOUTH 2 (TWO) TIMES DAILY WITH A MEAL.  Dispense: 180 tablet; Refill: 3 - amLODipine (NORVASC) 10 MG tablet; Take 1 tablet (10 mg total) by mouth daily. Mush have office visit for refills  Dispense: 90 tablet; Refill: 3 - losartan (COZAAR) 100 MG tablet; TAKE 1 TABLET (100 MG TOTAL) BY MOUTH DAILY.  Dispense: 90 tablet; Refill:  3  2. Tobacco dependence Pt is current smoker. Patient advised to quit smoking. Discussed health risks associated with smoking including lung and other types of cancers, chronic lung diseases and CV risks.. Pt ready to give trail of quitting.   Discussed methods to help quit including quitting cold [place], use of NRT, Chantix and Bupropion.  Pt wanting to try: Nicotine patches and nicotine gum.  I discussed the stepdown approach using the nicotine patches.  Patient willing to give this a try.  Advised that once she starts the patches she should try putting down the cigarettes. _4_ Minutes spent on counseling. F/U: We will evaluate her progress  on her subsequent visit in 4 months. - nicotine (NICOTINE STEP 1) 21 mg/24hr patch; Place 1 patch (21 mg total) onto the skin daily.  Dispense: 28 patch; Refill: 0 - nicotine polacrilex (NICORETTE) 2 MG gum; Take 1 each (2 mg total) by mouth as needed for smoking cessation.  Dispense: 100 tablet; Refill: 0  3. Acute left-sided thoracic back pain Suspect this is musculoskeletal in nature.  Recommend avoid heavy lifting.  Try her with a muscle relaxant to use as needed.  Advised that it can cause drowsiness. - cyclobenzaprine (FLEXERIL) 10 MG tablet; Take 0.5 tablets (5 mg total) by mouth 2 (two) times daily as needed for muscle spasms.  Dispense: 30 tablet; Refill: 0  4. Seasonal allergic rhinitis due to pollen - loratadine (CLARITIN) 10 MG tablet; Take 1 tablet (10 mg total) by mouth daily.  Dispense: 30 tablet; Refill: 11  5. Chronic cough - albuterol (VENTOLIN HFA) 108 (90 Base) MCG/ACT inhaler; Inhale 2 puffs into the lungs every 4 (four) hours as needed for wheezing or shortness of breath.  Dispense: 18 g; Refill: 0  6. Lung nodule, multiple Patient agreeable to having CAT scan to follow-up on this.  She is at high risk given that she is a smoker. - CT CHEST NODULE FOLLOW UP LOW DOSE W/O; Future  7. Kidney lesion, native, left Patient agreeable to moving forward with getting the MRI of the abdomen to evaluate - MR ABDOMEN WWO CONTRAST; Future  8. Encounter for screening mammogram for malignant neoplasm of breast - MM Digital Screening; Future    Patient was given the opportunity to ask questions.  Patient verbalized understanding of the plan and was able to repeat key elements of the plan.   No orders of the defined types were placed in this encounter.    Requested Prescriptions    No prescriptions requested or ordered in this encounter    No follow-ups on file.  Karle Plumber, MD, FACP

## 2020-11-19 NOTE — Progress Notes (Signed)
PCP: Dr. Wynetta Emery  S:    Patient arrives today in good spirits. Presents to the clinic for hypertension evaluation, counseling, and management.  Pt is also here for labs.   Patient was referred and last seen by Primary Care Provider on 11/13/20. At that visit, BP was above goal so Dr. Wynetta Emery added HCTZ 12.5 mg daily. Pt is a 60 YO AA female who is currently on all first line agents for HTN. Pt is a chronic smoker who has complaints about wanting to gain weight. Pt has been hypercalcemic in the past.  Pt endorses she takes meds around 8:30 am every morning and has taken them prior to today's visit. Pt also reports having a cigarette 30 minutes prior to today's visit. Does not report any ADEs but has been going to the bathroom more frequently.   Current BP Medications include: HCTZ 12.5 mg capsule - Take 1 cap PO daily  Carvedilol 25 mg - Take 1 tab PO BID w/ meals   Amlodipine 10 mg - Take 1 tab PO daily  Losartan 100 mg - Take 1 tab PO daily  Antihypertensives tried in the past include: Hyzaar 100- 25 mg - Take 1 tab PO daily  Dietary habits include: Pt endorses limiting salt intake but enjoys pasta and rice. States she has 1 cup of decaf coffee every morning and tries to stay away from red meat.   Exercise habits include: does not currently exercise  Family / Social history: reports she does not drink. Smokes cigarettes when stressed. 6 - 8 cigarettes/ day.   O:  Vitals:   11/20/20 1217  BP: (!) 161/109   Home BP readings: Does not check at home. Encouraged pt to check BP readings 1 - 2 hours after taking medications in the morning.   Last 3 Office BP readings: BP Readings from Last 3 Encounters:  11/20/20 (!) 161/109  11/13/20 (!) 157/86  04/06/20 (!) 208/108    BMET    Component Value Date/Time   NA 138 04/06/2020 1215   K 3.7 04/06/2020 1215   CL 101 04/06/2020 1215   CO2 25 04/06/2020 1215   GLUCOSE 82 04/06/2020 1215   GLUCOSE 90 09/16/2017 0913   BUN 15  04/06/2020 1215   CREATININE 0.77 04/06/2020 1215   CREATININE 0.84 09/16/2017 0913   CALCIUM 10.4 (H) 04/06/2020 1215   GFRNONAA 85 04/06/2020 1215   GFRNONAA 81 12/23/2016 1413   GFRAA 98 04/06/2020 1215   GFRAA >89 12/23/2016 1413    Renal function: CrCl cannot be calculated (Patient's most recent lab result is older than the maximum 21 days allowed.).  Clinical ASCVD: No  The ASCVD Risk score Mikey Bussing DC Jr., et al., 2013) failed to calculate for the following reasons:   Cannot find a previous HDL lab   Cannot find a previous total cholesterol lab  A/P: Longstanding uncontrolled hypertension on current medications. BP Goal = <130/57mmHg. Medication adherence reported. Counseled on lifestyle modifications for blood pressure control including reduced dietary sodium, increased exercise, adequate sleep. Pt was also counseled on smoking cessation and how this could help her gain weight. Spoke with pt about how diuretics help remove fluid (more likely water weight) she verbalized understanding. Discussed options with patient regarding her medications and she verbalized she is content with adding Hyzaar back because she "wants to be around longer for her grandchildren." If patient continues to be uncontrolled, consider Spironolactone 25 mg daily.  Continue Carvedilol 25 mg - Take  1 tablet PO BID with meals Continue  Amlodipine 10 mg - Take 1 tablet PO daily  Discontinue Losartan 100 mg - Take 1 tablet PO daily and HCTZ 12.5 cap - Take 1 cap PO daily  Initiate Hyzaar 100-25 mg tab - Take 1 tablet PO daily  Labs today per PCP; she was hypercalcemic on thiazides in the past.   Results reviewed and written information provided. Total time in face-to-face counseling 30 minutes.   F/U Clinic Visit in 1 week with [facility].  Patient seen with [place] Erric Machnik, [facility] PharmD Candidate 23'.  Benard Halsted, PharmD, [facility], [place] [phone]

## 2020-11-20 ENCOUNTER — Other Ambulatory Visit: Payer: Self-pay

## 2020-11-20 ENCOUNTER — Encounter: Payer: Self-pay | Admitting: Pharmacist

## 2020-11-20 ENCOUNTER — Ambulatory Visit: Payer: 59 | Attending: Internal Medicine | Admitting: Pharmacist

## 2020-11-20 DIAGNOSIS — I10 Essential (primary) hypertension: Secondary | ICD-10-CM | POA: Diagnosis not present

## 2020-11-20 MED ORDER — LOSARTAN POTASSIUM-HCTZ 100-25 MG PO TABS
1.0000 | ORAL_TABLET | Freq: Every day | ORAL | 2 refills | Status: DC
  Filled 2020-11-20: qty 30, 30d supply, fill #0

## 2020-11-21 LAB — CBC
Hematocrit: 42.2 % (ref 34.0–46.6)
Hemoglobin: 14.4 g/dL (ref 11.1–15.9)
MCH: 29.3 pg (ref 26.6–33.0)
MCHC: 34.1 g/dL (ref 31.5–35.7)
MCV: 86 fL (ref 79–97)
Platelets: 257 10*3/uL (ref 150–450)
RBC: 4.91 x10E6/uL (ref 3.77–5.28)
RDW: 12.5 % (ref 11.7–15.4)
WBC: 7.9 10*3/uL (ref 3.4–10.8)

## 2020-11-21 LAB — COMPREHENSIVE METABOLIC PANEL
ALT: 9 IU/L (ref 0–32)
AST: 12 IU/L (ref 0–40)
Albumin/Globulin Ratio: 1.7 (ref 1.2–2.2)
Albumin: 4.3 g/dL (ref 3.8–4.9)
Alkaline Phosphatase: 82 IU/L (ref 44–121)
BUN/Creatinine Ratio: 19 (ref 9–23)
BUN: 16 mg/dL (ref 6–24)
Bilirubin Total: 0.3 mg/dL (ref 0.0–1.2)
CO2: 22 mmol/L (ref 20–29)
Calcium: 10.7 mg/dL — ABNORMAL HIGH (ref 8.7–10.2)
Chloride: 102 mmol/L (ref 96–106)
Creatinine, Ser: 0.85 mg/dL (ref 0.57–1.00)
Globulin, Total: 2.6 g/dL (ref 1.5–4.5)
Glucose: 92 mg/dL (ref 65–99)
Potassium: 3.4 mmol/L — ABNORMAL LOW (ref 3.5–5.2)
Sodium: 139 mmol/L (ref 134–144)
Total Protein: 6.9 g/dL (ref 6.0–8.5)
eGFR: 79 mL/min/{1.73_m2} (ref >59)

## 2020-11-21 LAB — LIPID PANEL
Chol/HDL Ratio: 2.9 ratio (ref 0.0–4.4)
Cholesterol, Total: 191 mg/dL (ref 100–199)
HDL: 66 mg/dL (ref >39)
LDL Chol Calc (NIH): 114 mg/dL — ABNORMAL HIGH (ref 0–99)
Triglycerides: 58 mg/dL (ref 0–149)
VLDL Cholesterol Cal: 11 mg/dL (ref 5–40)

## 2020-11-22 ENCOUNTER — Other Ambulatory Visit: Payer: Self-pay | Admitting: Internal Medicine

## 2020-11-22 ENCOUNTER — Other Ambulatory Visit: Payer: Self-pay

## 2020-11-22 ENCOUNTER — Telehealth: Payer: Self-pay | Admitting: Internal Medicine

## 2020-11-22 DIAGNOSIS — R053 Chronic cough: Secondary | ICD-10-CM

## 2020-11-22 MED ORDER — ATORVASTATIN CALCIUM 10 MG PO TABS
10.0000 mg | ORAL_TABLET | Freq: Every day | ORAL | 3 refills | Status: DC
  Filled 2020-11-22: qty 30, 30d supply, fill #0
  Filled 2021-01-11: qty 30, 30d supply, fill #1
  Filled 2021-03-02: qty 30, 30d supply, fill #2
  Filled 2021-06-04: qty 30, 30d supply, fill #3
  Filled 2021-07-31: qty 30, 30d supply, fill #4
  Filled 2021-07-31: qty 30, 30d supply, fill #0
  Filled 2021-09-10: qty 30, 30d supply, fill #1
  Filled 2021-11-06 – 2021-11-20 (×2): qty 30, 30d supply, fill #2

## 2020-11-22 MED ORDER — LOSARTAN POTASSIUM 100 MG PO TABS
100.0000 mg | ORAL_TABLET | Freq: Every day | ORAL | 5 refills | Status: DC
  Filled 2020-11-22 – 2021-09-10 (×2): qty 30, 30d supply, fill #0
  Filled 2021-11-06 – 2021-11-20 (×2): qty 30, 30d supply, fill #1

## 2020-11-22 MED ORDER — HYDRALAZINE HCL 25 MG PO TABS
25.0000 mg | ORAL_TABLET | Freq: Three times a day (TID) | ORAL | 5 refills | Status: DC
  Filled 2020-11-22: qty 90, 30d supply, fill #0
  Filled 2021-03-02: qty 90, 30d supply, fill #1
  Filled 2021-06-04: qty 90, 30d supply, fill #2

## 2020-11-22 MED ORDER — POTASSIUM CHLORIDE ER 8 MEQ PO CPCR
16.0000 meq | ORAL_CAPSULE | Freq: Every day | ORAL | 0 refills | Status: DC
  Filled 2020-11-22: qty 60, 30d supply, fill #0

## 2020-11-22 NOTE — Telephone Encounter (Signed)
Phone call placed to patient this morning to go over lab results. Patient informed that her potassium level is slightly low and calcium level is elevated.  This is most likely due to the hydrochlorothiazide that was started on her recent visit.  I do see that she followed up with the clinical pharmacist recently and Cozaar 100 mg daily and hydrochlorothiazide 12.5 mg daily were Hyzaar 100 mg / 25 mg daily.  She already picked up a 30-day supply and has started taking.  She also reports increased co-pay cost of $19. -Because the HCTZ component is causing changes in her electrolytes, I recommend stopping the combination pill once she finishes the current bottle of 1 month supply.  At that point we will change her back to Cozaar 100 mg daily and add hydralazine.  Patient tells me she thinks we had prescribed hydralazine for her in the past.  In looking through her chart I see that it was prescribed in August 2019.  However patient never did pick up the prescription and blood pressure seem to have improved so we took it off her medication list. -I will send a prescription to the pharmacy for potassium supplement for her to take for just 1 month while she is still on the Hyzaar.  2.  LDL cholesterol is elevated with a high ASCVD score.  I recommend starting Lipitor to help lower the cholesterol and risks of cardiovascular events.  Patient agreeable to starting the medication.  Prescription will be sent to her pharmacy.

## 2020-11-23 ENCOUNTER — Telehealth: Payer: Self-pay | Admitting: Internal Medicine

## 2020-11-23 ENCOUNTER — Other Ambulatory Visit: Payer: Self-pay

## 2020-11-23 NOTE — Telephone Encounter (Signed)
Dani calling [facility] preservice center is calling because Dr. Wynetta Emery ordered an MRI w/o contrast and the procedure needs approval. Please advise CB- [phone]

## 2020-11-24 ENCOUNTER — Ambulatory Visit: Payer: 59 | Attending: Internal Medicine | Admitting: Pharmacist

## 2020-11-24 ENCOUNTER — Other Ambulatory Visit: Payer: Self-pay

## 2020-11-24 ENCOUNTER — Ambulatory Visit (HOSPITAL_COMMUNITY)
Admission: RE | Admit: 2020-11-24 | Discharge: 2020-11-24 | Disposition: A | Discharge status: Home / Self Care | Payer: 59 | Source: Ambulatory Visit | Attending: Internal Medicine | Admitting: Internal Medicine

## 2020-11-24 VITALS — BP 154/101

## 2020-11-24 DIAGNOSIS — R918 Other nonspecific abnormal finding of lung field: Secondary | ICD-10-CM | POA: Insufficient documentation

## 2020-11-24 DIAGNOSIS — I10 Essential (primary) hypertension: Secondary | ICD-10-CM

## 2020-11-24 NOTE — Addendum Note (Signed)
Addended by: Karle Plumber B on: 11/24/2020 03:21 PM   Modules accepted: Orders

## 2020-11-24 NOTE — Progress Notes (Signed)
PCP: Dr. Wynetta Emery  S:    Patient arrives today in good spirits. Presents to the clinic for hypertension evaluation, counseling, and management.  Pt is also here for labs.   Patient was referred and last seen by Primary Care Provider on 11/13/20. I saw him on 11/20/2020. We changed losartan and HCTZ to combination Hyzaar d/t above goal BP and got labs.   Unfortunately, pt's labs from 5/16 showed hypercalcemia and hypokalemia. Dr. Wynetta Emery contacted pt with the recommendation to stop Hyzaar, restart single agent losartan, and add hydralazine. The pt  Had just picked up a 30-day supply of Hyzaar. She wanted to finish this before going back to single agent losartan and adding the hydralazine. Dr. Wynetta Emery told her that this was okay.   Today, pt verbalizes understanding to restart losartan and start hydralazine once she finishes the Hyzaar. She knows that she is to continue amlodipine and carvedilol. This morning, she has taken the Hyzaar, AM dose of carvedilol, and amlodipine.   Current BP Medications include: Hyzaar 100-25 mg - Take 1 tablet PO daily Carvedilol 25 mg - Take 1 tab PO BID w/ meals   Amlodipine 10 mg - Take 1 tab PO daily  **Hydralazine and losartan listed on med profile, however, pt has not started these yet.   Dietary habits include: Pt endorses limiting salt intake but enjoys pasta and rice. States she has 1 cup of decaf coffee every morning and tries to stay away from red meat.   Exercise habits include: does not currently exercise  Family / Social history: reports she does not drink. Smokes cigarettes when stressed. 6 - 8 cigarettes/ day.   O:  Vitals:   11/24/20 0908  BP: (!) 154/101   Home BP readings:  -Reports 1 reading from yesterday of 140s/80s.   Last 3 Office BP readings: BP Readings from Last 3 Encounters:  11/24/20 (!) 154/101  11/20/20 (!) 161/109  11/13/20 (!) 157/86   BMET    Component Value Date/Time   NA 139 11/20/2020 1018   K 3.4 (L)  11/20/2020 1018   CL 102 11/20/2020 1018   CO2 22 11/20/2020 1018   GLUCOSE 92 11/20/2020 1018   GLUCOSE 90 09/16/2017 0913   BUN 16 11/20/2020 1018   CREATININE 0.85 11/20/2020 1018   CREATININE 0.84 09/16/2017 0913   CALCIUM 10.7 (H) 11/20/2020 1018   GFRNONAA 85 04/06/2020 1215   GFRNONAA 81 12/23/2016 1413   GFRAA 98 04/06/2020 1215   GFRAA >89 12/23/2016 1413   Renal function: Estimated Creatinine Clearance: 51.2 mL/min (by C-G formula based on SCr of 0.85 mg/dL).  Clinical ASCVD: No  The 10-year ASCVD risk score Mikey Bussing DC Jr., et al., 2013) is: 17.9%   Values used to calculate the score:     Age: 60 years     Sex: Female     Is Non-Hispanic African American: Yes     Diabetic: No     Tobacco smoker: Yes     Systolic Blood Pressure: 948 mmHg     Is BP treated: Yes     HDL Cholesterol: 66 mg/dL     Total Cholesterol: 191 mg/dL  A/P: Longstanding uncontrolled hypertension on current medications. BP Goal = <130/16mmHg. Medication adherence reported. Pt verbalizes instruction from Dr. Wynetta Emery and feels good about the plan. I will make no changes today. Reinforced the plan and for pt to follow-up with Dr. Wynetta Emery next month.  Continue Carvedilol 25 mg - Take 1 tablet  PO BID with meals Continue  Amlodipine 10 mg - Take 1 tablet PO daily  Continue Hyzaar 100-25 mg  Initiate Hyzaar 100-25 mg tab - Take 1 tablet PO daily  Labs today per PCP; she was hypercalcemic on thiazides in the past.   Results reviewed and written information provided. Total time in face-to-face counseling 30 minutes.   F/U Clinic Visit with PCP in 1 month. Patient seen with [place] Staley, [facility] PharmD Candidate 23'.  Benard Halsted, PharmD, [facility], [place] [phone]

## 2020-11-24 NOTE — Telephone Encounter (Signed)
Order has been changed

## 2020-11-27 ENCOUNTER — Ambulatory Visit (HOSPITAL_COMMUNITY): Payer: 59

## 2020-11-27 ENCOUNTER — Encounter: Payer: Self-pay | Admitting: Internal Medicine

## 2020-11-27 DIAGNOSIS — I700 Atherosclerosis of aorta: Secondary | ICD-10-CM | POA: Insufficient documentation

## 2020-11-27 NOTE — Progress Notes (Signed)
Lrt pt know that the nodule seen on previous CT scan in 2019 is no longer there. Incidental finding of cholesterol plaque build up in the aorta which is the main artery that bumps blood from the heart to the rest of the body.  The medication called Atorvastatin that I started her on to help lower cholesterol will help decrease this plaque build up.

## 2020-11-28 ENCOUNTER — Telehealth: Payer: Self-pay

## 2020-11-28 NOTE — Telephone Encounter (Signed)
Contacted pt to go over Ct results pt didn't answer lvm

## 2020-11-29 ENCOUNTER — Ambulatory Visit: Payer: Self-pay | Admitting: Family Medicine

## 2020-12-01 ENCOUNTER — Ambulatory Visit: Payer: Self-pay

## 2020-12-01 NOTE — Telephone Encounter (Signed)
Patient called to give results. She says that she is still having sinus and chest congestion. She says this has been ongoing since her last OV, but the past 3-4 days she has yellowish mucus and phlegm when coughed. She denies fever. She says she took a home COVID test 2 days ago and it was negative. She wanted to know if Dr. Wynetta Emery will call an antibiotic. I advised she will need OV. No available appointments. Advised e-visit, virtual visit on [employer], or UC. She says she had planned to go to the UC tomorrow. Care advice given, patient verbalized understanding.   Reason for Disposition . [1] Sinus congestion (pressure, fullness) AND [2] present > 10 days  Answer Assessment - Initial Assessment Questions 1. LOCATION: "Where does it hurt?"      No pain 2. ONSET: "When did the sinus pain start?"  (e.g., hours, days)      No pain 3. SEVERITY: "How bad is the pain?"   (Scale 1-10; mild, moderate or severe)   - MILD (1-3): doesn't interfere with normal activities    - MODERATE (4-7): interferes with normal activities (e.g., work or school) or awakens from sleep   - SEVERE (8-10): excruciating pain and patient unable to do any normal activities        N/A 4. RECURRENT SYMPTOM: "Have you ever had sinus problems before?" If Yes, ask: "When was the last time?" and "What happened that time?"      Yes, it's been a while 5. NASAL CONGESTION: "Is the nose blocked?" If Yes, ask: "Can you open it or must you breathe through your mouth?"     One side is blocked 6. NASAL DISCHARGE: "Do you have discharge from your nose?" If so ask, "What color?"     Yellowish color for the past 3-4 days 7. FEVER: "Do you have a fever?" If Yes, ask: "What is it, how was it measured, and when did it start?"      No 8. OTHER SYMPTOMS: "Do you have any other symptoms?" (e.g., sore throat, cough, earache, difficulty breathing)     Cough/chest congestion-thick yellow mucus 9. PREGNANCY: "Is there any chance you are  pregnant?" "When was your last menstrual period?"     No  Protocols used: SINUS PAIN OR CONGESTION-A-AH

## 2020-12-07 ENCOUNTER — Telehealth: Payer: Self-pay | Admitting: Internal Medicine

## 2020-12-07 NOTE — Telephone Encounter (Unsigned)
Copied from [place] [id number]. Topic: General - Other >> Dec 07, 2020  1:38 PM Lennox Solders wrote: Reason for CRM: Lori Bush with [place] preservice is calling and MRI abd with and without contrast needs prior authorization. Pt has an appt on Monday 12-11-2020

## 2020-12-11 ENCOUNTER — Ambulatory Visit (HOSPITAL_COMMUNITY): Payer: 59

## 2020-12-18 ENCOUNTER — Ambulatory Visit (HOSPITAL_COMMUNITY): Payer: 59

## 2020-12-27 ENCOUNTER — Other Ambulatory Visit: Payer: Self-pay

## 2020-12-27 ENCOUNTER — Telehealth: Payer: Self-pay

## 2020-12-27 MED ORDER — AMOXICILLIN 875 MG PO TABS
ORAL_TABLET | ORAL | 0 refills | Status: DC
  Filled 2020-12-27: qty 10, 5d supply, fill #0

## 2020-12-27 MED ORDER — IBUPROFEN 600 MG PO TABS
600.0000 mg | ORAL_TABLET | Freq: Four times a day (QID) | ORAL | 0 refills | Status: DC | PRN
  Filled 2020-12-27: qty 30, 7d supply, fill #0

## 2020-12-27 NOTE — Telephone Encounter (Signed)
MRI has been rescheduled to 01/03/21 at 8am at [facility]. Pt will need to arrive at 745am. Contacted pt to go over appt info pt didn't answer lvm

## 2020-12-27 NOTE — Telephone Encounter (Signed)
Previous appts for MRI  11/27/20 12/11/20 12/18/20   Did prior auth for MRI Abdomen w and w/o contrast Received notification on 12/13/20 that PER [place] DENIED  Contacted [employer] and spoke to Ms. Henderson with physician spport and per Ms. Henderson the only facility that pulls up is in [place]. Made Ms. Henderson aware that we always use the hospital. Per Ms. Koleen Nimrod I am not allowed to do the peer to peer it will need to be a MD, NP or PA.   Reached out to Dr. Wynetta Emery to see if she is able to do peer to peer today and if she what time. Per Dr. Wynetta Emery she would be able to do peer to peer  on 6/22 at 1pm.   Reached out to [employer] and schedule peer to peer.   Will wait back to see if facility was approved so MRI can be rescheduled

## 2020-12-27 NOTE — Telephone Encounter (Signed)
error 

## 2020-12-28 ENCOUNTER — Encounter: Payer: Self-pay | Admitting: Internal Medicine

## 2020-12-28 ENCOUNTER — Other Ambulatory Visit: Payer: Self-pay

## 2020-12-28 ENCOUNTER — Ambulatory Visit (HOSPITAL_BASED_OUTPATIENT_CLINIC_OR_DEPARTMENT_OTHER): Payer: 59 | Admitting: Internal Medicine

## 2020-12-28 VITALS — BP 128/90 | HR 66 | Resp 16 | Wt 99.4 lb

## 2020-12-28 DIAGNOSIS — Z5321 Procedure and treatment not carried out due to patient leaving prior to being seen by health care provider: Secondary | ICD-10-CM

## 2021-01-03 ENCOUNTER — Ambulatory Visit (HOSPITAL_COMMUNITY)
Admission: RE | Admit: 2021-01-03 | Discharge: 2021-01-03 | Disposition: A | Discharge status: Home / Self Care | Payer: 59 | Source: Ambulatory Visit | Attending: Internal Medicine | Admitting: Internal Medicine

## 2021-01-03 ENCOUNTER — Other Ambulatory Visit: Payer: Self-pay

## 2021-01-03 DIAGNOSIS — N289 Disorder of kidney and ureter, unspecified: Secondary | ICD-10-CM | POA: Insufficient documentation

## 2021-01-03 DIAGNOSIS — K7689 Other specified diseases of liver: Secondary | ICD-10-CM | POA: Diagnosis not present

## 2021-01-03 DIAGNOSIS — N281 Cyst of kidney, acquired: Secondary | ICD-10-CM | POA: Diagnosis not present

## 2021-01-03 MED ORDER — GADOBUTROL 1 MMOL/ML IV SOLN
4.0000 mL | Freq: Once | INTRAVENOUS | Status: AC | PRN
  Administered 2021-01-03: 4 mL via INTRAVENOUS

## 2021-01-05 ENCOUNTER — Telehealth: Payer: Self-pay

## 2021-01-05 NOTE — Telephone Encounter (Signed)
Contacted pt to go over lab results pt didn't answer lvm   Sent a CRM and forward labs to NT to give pt labs when they call back   

## 2021-01-11 ENCOUNTER — Encounter: Payer: Self-pay | Admitting: Internal Medicine

## 2021-01-11 ENCOUNTER — Other Ambulatory Visit: Payer: Self-pay

## 2021-01-11 ENCOUNTER — Other Ambulatory Visit (HOSPITAL_COMMUNITY)
Admission: RE | Admit: 2021-01-11 | Discharge: 2021-01-11 | Disposition: A | Discharge status: Home / Self Care | Payer: 59 | Source: Ambulatory Visit | Attending: Internal Medicine | Admitting: Internal Medicine

## 2021-01-11 ENCOUNTER — Ambulatory Visit: Payer: 59 | Attending: Internal Medicine | Admitting: Internal Medicine

## 2021-01-11 VITALS — BP 165/98 | HR 87 | Resp 16 | Wt 99.0 lb

## 2021-01-11 DIAGNOSIS — I10 Essential (primary) hypertension: Secondary | ICD-10-CM | POA: Diagnosis not present

## 2021-01-11 DIAGNOSIS — F17210 Nicotine dependence, cigarettes, uncomplicated: Secondary | ICD-10-CM

## 2021-01-11 DIAGNOSIS — N289 Disorder of kidney and ureter, unspecified: Secondary | ICD-10-CM

## 2021-01-11 DIAGNOSIS — I700 Atherosclerosis of aorta: Secondary | ICD-10-CM | POA: Diagnosis not present

## 2021-01-11 DIAGNOSIS — F17200 Nicotine dependence, unspecified, uncomplicated: Secondary | ICD-10-CM

## 2021-01-11 DIAGNOSIS — Z113 Encounter for screening for infections with a predominantly sexual mode of transmission: Secondary | ICD-10-CM | POA: Insufficient documentation

## 2021-01-11 DIAGNOSIS — Z124 Encounter for screening for malignant neoplasm of cervix: Secondary | ICD-10-CM | POA: Diagnosis not present

## 2021-01-11 DIAGNOSIS — R69 Illness, unspecified: Secondary | ICD-10-CM | POA: Diagnosis not present

## 2021-01-11 MED ORDER — AMLODIPINE BESYLATE 10 MG PO TABS
10.0000 mg | ORAL_TABLET | Freq: Every day | ORAL | 6 refills | Status: DC
Start: 2021-01-11 — End: 2022-09-11
  Filled 2021-01-11: qty 30, 30d supply, fill #0
  Filled 2021-03-02: qty 30, 30d supply, fill #1
  Filled 2021-05-03: qty 30, 30d supply, fill #2
  Filled 2021-06-04: qty 30, 30d supply, fill #3
  Filled 2021-07-31: qty 30, 30d supply, fill #0
  Filled 2021-07-31: qty 30, 30d supply, fill #4
  Filled 2021-09-10: qty 30, 30d supply, fill #1
  Filled 2021-12-13: qty 30, 30d supply, fill #2

## 2021-01-11 NOTE — Progress Notes (Signed)
Patient ID: Lori Bush, female    DOB: 1961-04-13  MRN: [id number]  CC: Gynecologic Exam and Medication Refill   Subjective: Lori Bush is a 60 y.o. female who presents for Pap Her concerns today include:   Hx of HTN, hep C cured with Harvoni, tobacco dep, wgh loss.    GYN History:  Pt is G4P5 Any hx of abn paps?: no Menses regular or irregular?:  She is menopausal How long does menses last?  N/A Menstrual flow light or heavy?:  N/A Method of birth control?:  N/A Any vaginal dischg at this time?:  A little but she states this is normal for her. Dysuria?:  No Any hx of STI?:  No Sexually active with how many partners: not active in the past 8 yrs Desires STI screen: yes Last MMG: Scheduled for later this month Family hx of uterine, cervical or breast cancer?: paternal aunt with cervical CA  Tob dep: did not use the patch.  Daughter was concerned about her using patches.  Pt feels she is doing good.  Has not smoked in past 4 days.,  Trying to wean herself off.    I went over the results of the MRI that was done to follow-up on the lesion that was seen in the left kidney.  This revealed 1.8 cm stable lesion that looked like hemorrhagic/proteinaceous cysts.  No solid enhancing renal lesions seen.  Incidental finding was large amount of stools in the bowel.  Patient reports that her bowel movements have been good.  She goes twice a day.  Sometimes she is a little constipated and has to strain. She also had CT of the chest for follow-up on lung nodules.  No nodules seen.  Incidental finding of aortic atherosclerosis.  I explained to her what aortic atherosclerosis is.  HTN: Blood pressure elevated today.  She states she has been out of Norvasc for about 2 days.  We had discontinued the Cozaar/HCTZ because she had low potassium level which is what happened when she was on HCTZ in the past.  So we decided to continue the Cozaar, Norvasc, carvedilol and added hydralazine  instead.  She has all of her bottles with her today.  She was a little confused about whether she was to continue the Cozaar/HCTZ.  Patient Active Problem List   Diagnosis Date Noted   Aortic atherosclerosis ([place]) 11/27/2020   Renal mass 85/63/1497   Helicobacter pylori (H. pylori) infection 02/27/2018   Lung nodule, multiple 02/27/2018   Tobacco dependence 03/17/2017   Cyst of skin 03/17/2017   Primary osteoarthritis of right knee 10/28/2016   Chronic hepatitis C without hepatic coma ([place]) 03/26/2016   Chronic right shoulder pain 01/17/2015   Hypertension 03/05/2013     Current Outpatient Medications on File Prior to Visit  Medication Sig Dispense Refill   albuterol (VENTOLIN HFA) 108 (90 Base) MCG/ACT inhaler Inhale 2 puffs into the lungs every 4 (four) hours as needed for wheezing or shortness of breath. 18 g 0   amLODipine (NORVASC) 10 MG tablet Take 1 tablet (10 mg total) by mouth daily. Mush have office visit for refills 90 tablet 3   atorvastatin (LIPITOR) 10 MG tablet Take 1 tablet (10 mg total) by mouth daily. 90 tablet 3   carvedilol (COREG) 25 MG tablet TAKE 1 TABLET (25 MG TOTAL) BY MOUTH 2 (TWO) TIMES DAILY WITH A MEAL. 180 tablet 3   cyclobenzaprine (FLEXERIL) 10 MG tablet Take 0.5 tablets (5 mg total) by mouth  2 (two) times daily as needed for muscle spasms. 30 tablet 0   fluticasone (FLONASE) 50 MCG/ACT nasal spray Place 2 sprays into both nostrils at bedtime. 16 g 1   hydrALAZINE (APRESOLINE) 25 MG tablet Take 1 tablet (25 mg total) by mouth 3 (three) times daily. Start after Losartan/Hydrochlorothiazide combination pill is finished. 90 tablet 5   ibuprofen (ADVIL) 600 MG tablet TAKE 1 TABLET EVERY 6 HOURS AS NEEDED. 30 tablet 0   loratadine (CLARITIN) 10 MG tablet Take 1 tablet (10 mg total) by mouth daily. 30 tablet 11   losartan (COZAAR) 100 MG tablet Take 1 tablet (100 mg total) by mouth daily. Start after you complete the bottle of Losartan/Hydrochlorothiazide 30  tablet 5   nicotine (NICOTINE STEP 1) 21 mg/24hr patch Place 1 patch (21 mg total) onto the skin daily. 28 patch 0   nicotine polacrilex (NICORETTE) 2 MG gum Take 1 each (2 mg total) by mouth as needed for smoking cessation. 100 tablet 0   Potassium Chloride CR (MICRO-K) 8 MEQ CPCR capsule CR Take 2 capsules (16 mEq total) by mouth daily. 60 capsule 0   No current facility-administered medications on file prior to visit.    Allergies  Allergen Reactions   Hydrochlorothiazide     Causes low Potassium and high calcium.    Social History   Socioeconomic History   Marital status: Single    Spouse name: Not on file   Number of children: 5   Years of education: Not on file   Highest education level: Not on file  Occupational History   Occupation: Publishing copy  Tobacco Use   Smoking status: Every Day    Packs/day: 0.50    Years: 31.00    Pack years: 15.50    Types: Cigarettes   Smokeless tobacco: Never   Tobacco comments:    states she cut back 3 cigarettes/day  Vaping Use   Vaping Use: Never used  Substance and Sexual Activity   Alcohol use: No   Drug use: No   Sexual activity: Not on file  Other Topics Concern   Not on file  Social History Narrative   Not on file   Social Determinants of Health   Financial Resource Strain: Not on file  Food Insecurity: Not on file  Transportation Needs: Not on file  Physical Activity: Not on file  Stress: Not on file  Social Connections: Not on file  Intimate Partner Violence: Not on file    Family History  Problem Relation Age of Onset   Hypertension Mother    Heart disease Mother    Hypertension Father    Dementia Father    Arthritis Sister    Hypertension Sister    Arthritis Brother    Hypertension Brother    Colon cancer Neg Hx    Stomach cancer Neg Hx    Rectal cancer Neg Hx    Esophageal cancer Neg Hx     Past Surgical History:  Procedure Laterality Date   CESAREAN SECTION     COLONOSCOPY WITH  ESOPHAGOGASTRODUODENOSCOPY (EGD)  03/2007   HERNIA REPAIR  1971   as a child   WISDOM TOOTH EXTRACTION      ROS: Review of Systems Negative except as stated above  PHYSICAL EXAM: BP (!) 165/98   Pulse 87   Resp 16   Wt 99 lb (44.9 kg)   SpO2 97%   BMI 19.33 kg/m   Physical Exam  General appearance - alert, well appearing,  and in no distress Mental status - normal mood, behavior, speech, dress, motor activity, and thought processes Breasts -CMA Ms. Sallyanne Havers present for breast and pelvic exam:  breasts appear normal, no suspicious masses, no skin or nipple changes or axillary nodes Pelvic -she does have atrophy at the opening to the vagina.  Otherwise normal external genitalia, vulva, vagina, cervix, uterus and adnexa.  Cervical os slightly stenosed.  Small amount of white discharge around the cervix.   CMP Latest Ref Rng & Units 11/20/2020 04/06/2020 05/11/2019  Glucose 65 - 99 mg/dL 92 82 85  BUN 6 - 24 mg/dL 16 15 14   Creatinine 0.57 - 1.00 mg/dL 0.85 0.77 0.75  Sodium 134 - 144 mmol/L 139 138 141  Potassium 3.5 - 5.2 mmol/L 3.4(L) 3.7 3.7  Chloride 96 - 106 mmol/L 102 101 105  CO2 20 - 29 mmol/L 22 25 26   Calcium 8.7 - 10.2 mg/dL 10.7(H) 10.4(H) 10.7(H)  Total Protein 6.0 - 8.5 g/dL 6.9 - -  Total Bilirubin 0.0 - 1.2 mg/dL 0.3 - -  Alkaline Phos 44 - 121 IU/L 82 - -  AST 0 - 40 IU/L 12 - -  ALT 0 - 32 IU/L 9 - -   Lipid Panel     Component Value Date/Time   CHOL 191 11/20/2020 1018   TRIG 58 11/20/2020 1018   HDL 66 11/20/2020 1018   CHOLHDL 2.9 11/20/2020 1018   CHOLHDL 3.0 09/30/2013 1509   VLDL 25 09/30/2013 1509   LDLCALC 114 (H) 11/20/2020 1018    CBC    Component Value Date/Time   WBC 7.9 11/20/2020 1018   WBC 7.9 12/23/2016 1413   RBC 4.91 11/20/2020 1018   RBC 5.15 (H) 12/23/2016 1413   HGB 14.4 11/20/2020 1018   HCT 42.2 11/20/2020 1018   PLT 257 11/20/2020 1018   MCV 86 11/20/2020 1018   MCH 29.3 11/20/2020 1018   MCH 29.5 12/23/2016 1413    MCHC 34.1 11/20/2020 1018   MCHC 34.2 12/23/2016 1413   RDW 12.5 11/20/2020 1018   LYMPHSABS 2,844 12/23/2016 1413   MONOABS 474 12/23/2016 1413   EOSABS 632 (H) 12/23/2016 1413   BASOSABS 79 12/23/2016 1413    ASSESSMENT AND PLAN: 1. Pap smear for cervical cancer screening - Cervicovaginal ancillary only - Cytology - PAP  2. Essential hypertension Not at goal.  Out of Norvasc.  Refill given. I did medication reconciliation with her.  I had her throw away which she had left of the combination pill Cozaar/HCTZ.  Confirm with her what she should be taking: Amlodipine, Cozaar, carvedilol and hydralazine. - amLODipine (NORVASC) 10 MG tablet; Take 1 tablet (10 mg total) by mouth daily. Mush have office visit for refills  Dispense: 30 tablet; Refill: 6  3. Tobacco dependence Commended her on her efforts to quit.  Encouraged her to set a quit date beyond which she will no longer purchase cigarettes.  4. Aortic atherosclerosis (HCC) Continue atorvastatin  5. Kidney lesion, native, left Based on the radiologic findings described by the radiologist, I think this is benign cysts.  However I will have her see the urologist just to be certain. - Ambulatory referral to Urology    Patient was given the opportunity to ask questions.  Patient verbalized understanding of the plan and was able to repeat key elements of the plan.   No orders of the defined types were placed in this encounter.    Requested Prescriptions    No prescriptions requested  or ordered in this encounter    No follow-ups on file.  Karle Plumber, MD, FACP

## 2021-01-12 ENCOUNTER — Telehealth: Payer: Self-pay

## 2021-01-12 LAB — CERVICOVAGINAL ANCILLARY ONLY
Bacterial Vaginitis (gardnerella): NEGATIVE
Candida Glabrata: NEGATIVE
Candida Vaginitis: NEGATIVE
Chlamydia: NEGATIVE
Comment: NEGATIVE
Comment: NEGATIVE
Comment: NEGATIVE
Comment: NEGATIVE
Comment: NEGATIVE
Comment: NORMAL
Neisseria Gonorrhea: NEGATIVE
Trichomonas: NEGATIVE

## 2021-01-12 NOTE — Telephone Encounter (Signed)
Contacted pt to go over lab results pt didn't answer lvm   Sent a CRM and forward labs to NT to give pt labs when they call back   

## 2021-01-17 LAB — CYTOLOGY - PAP
Comment: NEGATIVE
Diagnosis: NEGATIVE
High risk HPV: NEGATIVE

## 2021-01-24 ENCOUNTER — Ambulatory Visit
Admission: RE | Admit: 2021-01-24 | Discharge: 2021-01-24 | Disposition: A | Discharge status: Home / Self Care | Payer: 59 | Source: Ambulatory Visit | Attending: Internal Medicine | Admitting: Internal Medicine

## 2021-01-24 ENCOUNTER — Other Ambulatory Visit: Payer: Self-pay

## 2021-01-24 DIAGNOSIS — Z1231 Encounter for screening mammogram for malignant neoplasm of breast: Secondary | ICD-10-CM

## 2021-02-27 DIAGNOSIS — Z8249 Family history of ischemic heart disease and other diseases of the circulatory system: Secondary | ICD-10-CM | POA: Diagnosis not present

## 2021-02-27 DIAGNOSIS — I10 Essential (primary) hypertension: Secondary | ICD-10-CM | POA: Diagnosis not present

## 2021-02-27 DIAGNOSIS — I951 Orthostatic hypotension: Secondary | ICD-10-CM | POA: Diagnosis not present

## 2021-02-27 DIAGNOSIS — E785 Hyperlipidemia, unspecified: Secondary | ICD-10-CM | POA: Diagnosis not present

## 2021-02-27 DIAGNOSIS — Z720 Tobacco use: Secondary | ICD-10-CM | POA: Diagnosis not present

## 2021-03-02 ENCOUNTER — Other Ambulatory Visit: Payer: Self-pay

## 2021-03-06 ENCOUNTER — Other Ambulatory Visit: Payer: Self-pay

## 2021-03-16 ENCOUNTER — Ambulatory Visit: Payer: Self-pay

## 2021-03-16 NOTE — Telephone Encounter (Signed)
Patient called and she says her initial call was because she has sinus congestion, but she also feels like something is in her throat handing, like when you have a fish bone in the throat. She says she has eaten bread and she still feels that sensation. She says as far as her nasal congestion, it started a week ago. She says the drainage is clear when she blows out. She says there is no sinus pain, but she has pressure and fullness to the temple area and down the nose. No other symptoms reported. Advised no availability with PCP or anyone in the office next week. Advised the mobile bus location on Monday, patient says she will go there on Monday morning. Care advice given, patient verbalized understanding.   Reason for Disposition  [1] Sinus congestion (pressure, fullness) AND [2] present > 10 days  Answer Assessment - Initial Assessment Questions 1. LOCATION: "Where does it hurt?"      No pain 2. ONSET: "When did the sinus pain start?"  (e.g., hours, days)      No pain, just congestion 3. SEVERITY: "How bad is the pain?"   (Scale 1-10; mild, moderate or severe)   - MILD (1-3): doesn't interfere with normal activities    - MODERATE (4-7): interferes with normal activities (e.g., work or school) or awakens from sleep   - SEVERE (8-10): excruciating pain and patient unable to do any normal activities        N/A 4. RECURRENT SYMPTOM: "Have you ever had sinus problems before?" If Yes, ask: "When was the last time?" and "What happened that time?"      Yes 2 sinus infections in lifetime 5. NASAL CONGESTION: "Is the nose blocked?" If Yes, ask: "Can you open it or must you breathe through your mouth?"     Breathe through nose 6. NASAL DISCHARGE: "Do you have discharge from your nose?" If so ask, "What color?"     Yes, clear, no mucus 7. FEVER: "Do you have a fever?" If Yes, ask: "What is it, how was it measured, and when did it start?"      No 8. OTHER SYMPTOMS: "Do you have any other symptoms?"  (e.g., sore throat, cough, earache, difficulty breathing)     Feels like pill stuck in throat, negative covid test 9. PREGNANCY: "Is there any chance you are pregnant?" "When was your last menstrual period?"     No  Protocols used: Sinus Pain or Congestion-A-AH

## 2021-03-19 ENCOUNTER — Ambulatory Visit: Payer: 59 | Admitting: Physician Assistant

## 2021-03-19 ENCOUNTER — Other Ambulatory Visit: Payer: Self-pay

## 2021-03-19 VITALS — BP 151/94 | HR 70 | Temp 98.3°F | Resp 18 | Ht 60.0 in | Wt 98.0 lb

## 2021-03-19 DIAGNOSIS — I10 Essential (primary) hypertension: Secondary | ICD-10-CM

## 2021-03-19 DIAGNOSIS — J0110 Acute frontal sinusitis, unspecified: Secondary | ICD-10-CM

## 2021-03-19 LAB — POC COVID19 BINAXNOW: SARS Coronavirus 2 Ag: NEGATIVE

## 2021-03-19 MED ORDER — LORATADINE 10 MG PO TABS
10.0000 mg | ORAL_TABLET | Freq: Every day | ORAL | 11 refills | Status: DC
  Filled 2021-03-19 – 2021-05-03 (×2): qty 30, 30d supply, fill #0

## 2021-03-19 MED ORDER — FLUTICASONE PROPIONATE 50 MCG/ACT NA SUSP
2.0000 | Freq: Every day | NASAL | 1 refills | Status: DC
  Filled 2021-03-19: qty 16, 30d supply, fill #0

## 2021-03-19 MED ORDER — AZITHROMYCIN 250 MG PO TABS
ORAL_TABLET | ORAL | 0 refills | Status: DC
  Filled 2021-03-19: qty 6, 5d supply, fill #0

## 2021-03-19 NOTE — Progress Notes (Signed)
Established Patient Office Visit  Subjective:  Patient ID: Lori Bush, female    DOB: 10-May-1961  Age: 60 y.o. MRN: [id number]  CC:  Chief Complaint  Patient presents with   Sinus Problem    Post 1 week    HPI Marshella Tello reports that she has been having sinus pressure, clear nasal discharge, the feeling of something "stuck in her throat" for the past week.  Reports that she has been using Flonase without relief.  Denies sick contacts, denies fever, cough.  Reports that she does check her blood pressure at home, last blood pressure check was 136/86.  Reports that she had a home visit by a nurse with her in health insurance last week and it was within normal limits.      Past Medical History:  Diagnosis Date   Arthritis Dx 2012   Hypertension Dx 2005    Past Surgical History:  Procedure Laterality Date   CESAREAN SECTION     COLONOSCOPY WITH ESOPHAGOGASTRODUODENOSCOPY (EGD)  03/2007   HERNIA REPAIR  1971   as a child   WISDOM TOOTH EXTRACTION      Family History  Problem Relation Age of Onset   Hypertension Mother    Heart disease Mother    Hypertension Father    Dementia Father    Arthritis Sister    Hypertension Sister    Arthritis Brother    Hypertension Brother    Colon cancer Neg Hx    Stomach cancer Neg Hx    Rectal cancer Neg Hx    Esophageal cancer Neg Hx     Social History   Socioeconomic History   Marital status: Single    Spouse name: Not on file   Number of children: 5   Years of education: Not on file   Highest education level: Not on file  Occupational History   Occupation: Publishing copy  Tobacco Use   Smoking status: Every Day    Packs/day: 0.50    Years: 31.00    Pack years: 15.50    Types: Cigarettes   Smokeless tobacco: Never   Tobacco comments:    states she cut back 3 cigarettes/day  Vaping Use   Vaping Use: Never used  Substance and Sexual Activity   Alcohol use: No   Drug use: No   Sexual activity:  Not Currently  Other Topics Concern   Not on file  Social History Narrative   Not on file   Social Determinants of Health   Financial Resource Strain: Not on file  Food Insecurity: Not on file  Transportation Needs: Not on file  Physical Activity: Not on file  Stress: Not on file  Social Connections: Not on file  Intimate Partner Violence: Not on file    Outpatient Medications Prior to Visit  Medication Sig Dispense Refill   albuterol (VENTOLIN HFA) 108 (90 Base) MCG/ACT inhaler Inhale 2 puffs into the lungs every 4 (four) hours as needed for wheezing or shortness of breath. 18 g 0   amLODipine (NORVASC) 10 MG tablet Take 1 tablet (10 mg total) by mouth daily. Mush have office visit for refills 30 tablet 6   atorvastatin (LIPITOR) 10 MG tablet Take 1 tablet (10 mg total) by mouth daily. 90 tablet 3   carvedilol (COREG) 25 MG tablet TAKE 1 TABLET (25 MG TOTAL) BY MOUTH 2 (TWO) TIMES DAILY WITH A MEAL. 180 tablet 3   cyclobenzaprine (FLEXERIL) 10 MG tablet Take 0.5 tablets (5 mg total) by mouth  2 (two) times daily as needed for muscle spasms. 30 tablet 0   hydrALAZINE (APRESOLINE) 25 MG tablet Take 1 tablet (25 mg total) by mouth 3 (three) times daily. Start after Losartan/Hydrochlorothiazide combination pill is finished. 90 tablet 5   ibuprofen (ADVIL) 600 MG tablet TAKE 1 TABLET EVERY 6 HOURS AS NEEDED. 30 tablet 0   losartan (COZAAR) 100 MG tablet Take 1 tablet (100 mg total) by mouth daily. Start after you complete the bottle of Losartan/Hydrochlorothiazide 30 tablet 5   nicotine (NICOTINE STEP 1) 21 mg/24hr patch Place 1 patch (21 mg total) onto the skin daily. 28 patch 0   nicotine polacrilex (NICORETTE) 2 MG gum Take 1 each (2 mg total) by mouth as needed for smoking cessation. 100 tablet 0   fluticasone (FLONASE) 50 MCG/ACT nasal spray Place 2 sprays into both nostrils at bedtime. 16 g 1   loratadine (CLARITIN) 10 MG tablet Take 1 tablet (10 mg total) by mouth daily. 30 tablet 11    No facility-administered medications prior to visit.    Allergies  Allergen Reactions   Hydrochlorothiazide     Causes low Potassium and high calcium.    ROS Review of Systems  Constitutional:  Negative for chills and fever.  HENT:  Positive for congestion, sinus pressure and sore throat. Negative for ear pain and trouble swallowing.   Eyes: Negative.   Respiratory:  Negative for cough and shortness of breath.   Cardiovascular:  Negative for chest pain.  Gastrointestinal:  Negative for abdominal pain, diarrhea, nausea and vomiting.  Endocrine: Negative.   Genitourinary: Negative.   Musculoskeletal: Negative.   Skin: Negative.   Allergic/Immunologic: Negative.   Neurological:  Positive for headaches. Negative for dizziness and speech difficulty.  Hematological: Negative.   Psychiatric/Behavioral: Negative.       Objective:    Physical Exam Vitals and nursing note reviewed.  Constitutional:      Appearance: Normal appearance.  HENT:     Head: Normocephalic and atraumatic.     Salivary Glands: Right salivary gland is diffusely enlarged and tender. Left salivary gland is diffusely enlarged and tender.     Right Ear: Tympanic membrane, ear canal and external ear normal.     Left Ear: Tympanic membrane, ear canal and external ear normal.     Nose:     Right Turbinates: Enlarged and swollen.     Left Turbinates: Enlarged and swollen.     Right Sinus: Maxillary sinus tenderness and frontal sinus tenderness present.     Left Sinus: Maxillary sinus tenderness and frontal sinus tenderness present.     Mouth/Throat:     Lips: Pink.     Mouth: Mucous membranes are moist.     Pharynx: Posterior oropharyngeal erythema present.  Eyes:     Extraocular Movements: Extraocular movements intact.     Conjunctiva/sclera: Conjunctivae normal.     Pupils: Pupils are equal, round, and reactive to light.  Cardiovascular:     Rate and Rhythm: Normal rate and regular rhythm.     Pulses:  Normal pulses.     Heart sounds: Normal heart sounds.  Pulmonary:     Effort: Pulmonary effort is normal.     Breath sounds: Normal breath sounds.  Musculoskeletal:        General: Normal range of motion.     Cervical back: Normal range of motion and neck supple.  Lymphadenopathy:     Cervical: Cervical adenopathy present.  Skin:    General: Skin is warm  and dry.  Neurological:     General: No focal deficit present.     Mental Status: She is alert and oriented to person, place, and time.  Psychiatric:        Mood and Affect: Mood normal.        Behavior: Behavior normal.        Thought Content: Thought content normal.        Judgment: Judgment normal.    BP (!) 151/94 (BP Location: Left Arm, Patient Position: Sitting, Cuff Size: Normal)   Pulse 70   Temp 98.3 F (36.8 C) (Oral)   Resp 18   Ht 5' (1.524 m)   Wt 98 lb (44.5 kg)   SpO2 100%   BMI 19.14 kg/m  Wt Readings from Last 3 Encounters:  03/19/21 98 lb (44.5 kg)  01/11/21 99 lb (44.9 kg)  12/28/20 99 lb 6.4 oz (45.1 kg)     Health Maintenance Due  Topic Date Due   COVID-19 Vaccine (1) Never done   Zoster Vaccines- Shingrix (1 of 2) Never done   Pneumococcal Vaccine 45-77 Years old (2 - PCV) 12/23/2017   INFLUENZA VACCINE  02/05/2021    There are no preventive care reminders to display for this patient.  Lab Results  Component Value Date   TSH 3.100 05/11/2019   Lab Results  Component Value Date   WBC 7.9 11/20/2020   HGB 14.4 11/20/2020   HCT 42.2 11/20/2020   MCV 86 11/20/2020   PLT 257 11/20/2020   Lab Results  Component Value Date   NA 139 11/20/2020   K 3.4 (L) 11/20/2020   CO2 22 11/20/2020   GLUCOSE 92 11/20/2020   BUN 16 11/20/2020   CREATININE 0.85 11/20/2020   BILITOT 0.3 11/20/2020   ALKPHOS 82 11/20/2020   AST 12 11/20/2020   ALT 9 11/20/2020   PROT 6.9 11/20/2020   ALBUMIN 4.3 11/20/2020   CALCIUM 10.7 (H) 11/20/2020   ANIONGAP 11 04/16/2014   EGFR 79 11/20/2020   Lab  Results  Component Value Date   CHOL 191 11/20/2020   Lab Results  Component Value Date   HDL 66 11/20/2020   Lab Results  Component Value Date   LDLCALC 114 (H) 11/20/2020   Lab Results  Component Value Date   TRIG 58 11/20/2020   Lab Results  Component Value Date   CHOLHDL 2.9 11/20/2020   Lab Results  Component Value Date   HGBA1C 5.70 08/01/2015      Assessment & Plan:   Problem List Items Addressed This Visit       Cardiovascular and Mediastinum   Elevated blood pressure reading with diagnosis of hypertension   Other Visit Diagnoses     Acute frontal sinusitis, recurrence not specified    -  Primary   Relevant Medications   loratadine (CLARITIN) 10 MG tablet   fluticasone (FLONASE) 50 MCG/ACT nasal spray   azithromycin (ZITHROMAX) 250 MG tablet   Other Relevant Orders   POC COVID-19 (Completed)       Meds ordered this encounter  Medications   loratadine (CLARITIN) 10 MG tablet    Sig: Take 1 tablet (10 mg total) by mouth daily.    Dispense:  30 tablet    Refill:  11    Order Specific Question:   Supervising Provider    Answer:   Asencion Noble E [[id number]]   fluticasone (FLONASE) 50 MCG/ACT nasal spray    Sig: Place 2 sprays into both nostrils  at bedtime.    Dispense:  16 g    Refill:  1    Order Specific Question:   Supervising Provider    Answer:   Asencion Noble E [[id number]]   azithromycin (ZITHROMAX) 250 MG tablet    Sig: Take 2 tabs by moutth day 1, then take 1 tab by mouth once daily    Dispense:  6 tablet    Refill:  0    Order Specific Question:   Supervising Provider    Answer:   Joya Gaskins, PATRICK E [[id number]]   1. Acute frontal sinusitis, recurrence not specified Rapid COVID test negative, trial azithromycin.  Patient encouraged to take Claritin on a daily basis, use Flonase until symptoms resolve.  Patient education given on supportive care, red flags for prompt reevaluation. - POC COVID-19 - loratadine (CLARITIN) 10 MG tablet; Take 1 tablet  (10 mg total) by mouth daily.  Dispense: 30 tablet; Refill: 11 - fluticasone (FLONASE) 50 MCG/ACT nasal spray; Place 2 sprays into both nostrils at bedtime.  Dispense: 16 g; Refill: 1 - azithromycin (ZITHROMAX) 250 MG tablet; Take 2 tabs by moutth day 1, then take 1 tab by mouth once daily  Dispense: 6 tablet; Refill: 0  2. Elevated blood pressure reading with diagnosis of hypertension Patient encouraged to continue checking blood pressure at home, keeping a written log and having available for all office visits.  Patient encouraged to return to mobile unit if blood pressure readings remain elevated.   I have reviewed the patient's medical history (PMH, PSH, Social History, Family History, Medications, and allergies) , and have been updated if relevant. I spent 30 minutes reviewing chart and  face to face time with patient.     Follow-up: Return if symptoms worsen or fail to improve.    Loraine Grip Mayers, PA-C

## 2021-03-19 NOTE — Progress Notes (Signed)
Patient reports sinus concerns beginning 1 week ago with no N/V/D. Patient denies any exposure or other persons around her experiencing similar symptoms. Patient reports having seasonal allergies. Patient has had orange juice. Patient has taken BP around 7:40  Patient reports intermittent temporal headaches. Patient has not used Claritin but has used nasal spray.

## 2021-03-19 NOTE — Patient Instructions (Signed)
You will take azithromycin as directed for the next 5 days, I do encourage you to use your Flonase twice a day until your symptoms resolve, and I do encourage you to use Claritin on an every day basis to help prevent future sinus infections.  Make sure that you are drinking plenty of water and get lots of rest.  I hope that you feel better soon.  Please let us know if there is anything else we can do for you  Lori Rad, PA-C Physician Assistant [facility] [email or web address]  Sinusitis, Adult Sinusitis is inflammation of your sinuses. Sinuses are hollow spaces in the bones around your face. Your sinuses are located: Around your eyes. In the middle of your forehead. Behind your nose. In your cheekbones. Mucus normally drains out of your sinuses. When your nasal tissues become inflamed or swollen, mucus can become trapped or blocked. This allows bacteria, viruses, and fungi to grow, which leads to infection. Most infections of the sinuses are caused by a virus. Sinusitis can develop quickly. It can last for up to 4 weeks (acute) or for more than 12 weeks (chronic). Sinusitis often develops after a cold. What are the causes? This condition is caused by anything that creates swelling in the sinuses or stops mucus from draining. This includes: Allergies. Asthma. Infection from bacteria or viruses. Deformities or blockages in your nose or sinuses. Abnormal growths in the nose (nasal polyps). Pollutants, such as chemicals or irritants in the air. Infection from fungi (rare). What increases the risk? You are more likely to develop this condition if you: Have a weak body defense system (immune system). Do a lot of swimming or diving. Overuse nasal sprays. Smoke. What are the signs or symptoms? The main symptoms of this condition are pain and a feeling of pressure around the affected sinuses. Other symptoms include: Stuffy nose or  congestion. Thick drainage from your nose. Swelling and warmth over the affected sinuses. Headache. Upper toothache. A cough that may get worse at night. Extra mucus that collects in the throat or the back of the nose (postnasal drip). Decreased sense of smell and taste. Fatigue. A fever. Sore throat. Bad breath. How is this diagnosed? This condition is diagnosed based on: Your symptoms. Your medical history. A physical exam. Tests to find out if your condition is acute or chronic. This may include: Checking your nose for nasal polyps. Viewing your sinuses using a device that has a light (endoscope). Testing for allergies or bacteria. Imaging tests, such as an MRI or CT scan. In rare cases, a bone biopsy may be done to rule out more serious types of fungal sinus disease. How is this treated? Treatment for sinusitis depends on the cause and whether your condition is chronic or acute. If caused by a virus, your symptoms should go away on their own within 10 days. You may be given medicines to relieve symptoms. They include: Medicines that shrink swollen nasal passages (topical intranasal decongestants). Medicines that treat allergies (antihistamines). A spray that eases inflammation of the nostrils (topical intranasal corticosteroids). Rinses that help get rid of thick mucus in your nose (nasal saline washes). If caused by bacteria, your health care provider may recommend waiting to see if your symptoms improve. Most bacterial infections will get better without antibiotic medicine. You may be given antibiotics if you have: A severe infection. A weak immune system. If caused by narrow nasal passages or nasal polyps, you may need to have surgery. Follow these instructions  at home: Medicines Take, use, or apply over-the-counter and prescription medicines only as told by your health care provider. These may include nasal sprays. If you were prescribed an antibiotic medicine, take it as  told by your health care provider. Do not stop taking the antibiotic even if you start to feel better. Hydrate and humidify  Drink enough fluid to keep your urine pale yellow. Staying hydrated will help to thin your mucus. Use a cool mist humidifier to keep the humidity level in your home above 50%. Inhale steam for 10-15 minutes, 3-4 times a day, or as told by your health care provider. You can do this in the bathroom while a hot shower is running. Limit your exposure to cool or dry air. Rest Rest as much as possible. Sleep with your head raised (elevated). Make sure you get enough sleep each night. General instructions  Apply a warm, moist washcloth to your face 3-4 times a day or as told by your health care provider. This will help with discomfort. Wash your hands often with soap and water to reduce your exposure to germs. If soap and water are not available, use hand sanitizer. Do not smoke. Avoid being around people who are smoking (secondhand smoke). Keep all follow-up visits as told by your health care provider. This is important. Contact a health care provider if: You have a fever. Your symptoms get worse. Your symptoms do not improve within 10 days. Get help right away if: You have a severe headache. You have persistent vomiting. You have severe pain or swelling around your face or eyes. You have vision problems. You develop confusion. Your neck is stiff. You have trouble breathing. Summary Sinusitis is soreness and inflammation of your sinuses. Sinuses are hollow spaces in the bones around your face. This condition is caused by nasal tissues that become inflamed or swollen. The swelling traps or blocks the flow of mucus. This allows bacteria, viruses, and fungi to grow, which leads to infection. If you were prescribed an antibiotic medicine, take it as told by your health care provider. Do not stop taking the antibiotic even if you start to feel better. Keep all follow-up  visits as told by your health care provider. This is important. This information is not intended to replace advice given to you by your health care provider. Make sure you discuss any questions you have with your health care provider. Document Revised: 11/24/2017 Document Reviewed: 11/24/2017 Elsevier Patient Education  2022 [employer].

## 2021-04-12 ENCOUNTER — Other Ambulatory Visit: Payer: Self-pay

## 2021-04-12 ENCOUNTER — Ambulatory Visit: Payer: 59 | Attending: Internal Medicine | Admitting: Internal Medicine

## 2021-04-12 ENCOUNTER — Ambulatory Visit: Payer: Self-pay

## 2021-04-12 DIAGNOSIS — R0989 Other specified symptoms and signs involving the circulatory and respiratory systems: Secondary | ICD-10-CM

## 2021-04-12 NOTE — Telephone Encounter (Signed)
Pt has been contacted and scheduled a virtual for 10/6

## 2021-04-12 NOTE — Progress Notes (Signed)
Virtual Visit via Telephone Note  I connected with Lori Bush on 04/12/21 at 3:42 p.m by telephone and verified that I am speaking with the correct person using two identifiers.  Location: Patient: Work Provider:  office   I discussed the limitations, risks, security and privacy concerns of performing an evaluation and management service by telephone and the availability of in person appointments. I also discussed with the patient that there may be a patient responsible charge related to this service. The patient expressed understanding and agreed to proceed.   History of Present Illness: Hx of HTN, hep C cured with Harvoni, tobacco dep, wgh loss.  Last saw me 01/2021.  This is an urgent care visit.  Patient was evaluated by our physician assistant 03/19/2021 with complaints of sinus issues and feeling of something stuck in her throat for the past week.  She was noted to have some posterior oropharyngeal erythema on exam and 2 minutes were enlarged.  She had tenderness on palpation of the maxillary sinuses.  Diagnosed with acute sinusitis and was placed on Zithromax, Flonase nasal spray and Claritin.  Patient completed the treatment. However, she continues to have the feeling of something in the throat.  No pain with swallowing.  However she has modified her eating habits to eating smaller pieces at a time for fear of food getting stuck.  After she swallows her food she gets to feeling that she may regurgitate the food but she does not.  Today she felt like her pills were not going down well when she took them.  Denies any coughing with swallowing, no hoarseness.  She reports that her weight has stayed stable.  She does not feel any swelling around the anterior neck or any masses in the soft tissue around the neck.  Denies any feeling of acid reflux or bitter/acid taste at the back of the throat after she eats. Outpatient Encounter Medications as of 04/12/2021  Medication Sig   albuterol  (VENTOLIN HFA) 108 (90 Base) MCG/ACT inhaler Inhale 2 puffs into the lungs every 4 (four) hours as needed for wheezing or shortness of breath.   amLODipine (NORVASC) 10 MG tablet Take 1 tablet (10 mg total) by mouth daily. Mush have office visit for refills   atorvastatin (LIPITOR) 10 MG tablet Take 1 tablet (10 mg total) by mouth daily.   azithromycin (ZITHROMAX) 250 MG tablet Take 2 tabs by moutth day 1, then take 1 tab by mouth once daily   carvedilol (COREG) 25 MG tablet TAKE 1 TABLET (25 MG TOTAL) BY MOUTH 2 (TWO) TIMES DAILY WITH A MEAL.   cyclobenzaprine (FLEXERIL) 10 MG tablet Take 0.5 tablets (5 mg total) by mouth 2 (two) times daily as needed for muscle spasms.   fluticasone (FLONASE) 50 MCG/ACT nasal spray Place 2 sprays into both nostrils at bedtime.   hydrALAZINE (APRESOLINE) 25 MG tablet Take 1 tablet (25 mg total) by mouth 3 (three) times daily. Start after Losartan/Hydrochlorothiazide combination pill is finished.   ibuprofen (ADVIL) 600 MG tablet TAKE 1 TABLET EVERY 6 HOURS AS NEEDED.   loratadine (CLARITIN) 10 MG tablet Take 1 tablet (10 mg total) by mouth daily.   losartan (COZAAR) 100 MG tablet Take 1 tablet (100 mg total) by mouth daily. Start after you complete the bottle of Losartan/Hydrochlorothiazide   nicotine (NICOTINE STEP 1) 21 mg/24hr patch Place 1 patch (21 mg total) onto the skin daily.   nicotine polacrilex (NICORETTE) 2 MG gum Take 1 each (2 mg total) by  mouth as needed for smoking cessation.   No facility-administered encounter medications on file as of 04/12/2021.      Observations/Objective: Older African-American female sitting in chair in NAD  Assessment and Plan: 1. Globus sensation Patient denies painful swallowing but has globus sensation in the throat and feeling of need to regurgitate when she swallows.  I we will refer her to the gastroenterologist for endoscopy.  Advised that if she does not get a call within a few weeks for the appointment, she  should let me know so that I can have our referral person follow-up on it. - Ambulatory referral to Gastroenterology   Follow Up Instructions: Post GI visit if nothing is found   I discussed the assessment and treatment plan with the patient. The patient was provided an opportunity to ask questions and all were answered. The patient agreed with the plan and demonstrated an understanding of the instructions.   The patient was advised to call back or seek an in-person evaluation if the symptoms worsen or if the condition fails to improve as anticipated.  I spent 13 minutes dedicated to the care of this patient on the date of this encounter to include previsit review of visit with PA last month, face-to-face time with patient discussing diagnosis and management and postvisit ordering of tests.    Karle Plumber, MD

## 2021-04-12 NOTE — Telephone Encounter (Signed)
Pt. Reports she was treated for sinus infection 03/19/21. States she was having difficulty with her throat at that time. Having difficulty swallowing. No sore throat. No availability in the practice. Pt. Would like to be seen. Please advise.   Reason for Disposition  [1] Swallowing difficulty AND [2] cause unknown (Exception: difficulty swallowing is a chronic symptom)  Answer Assessment - Initial Assessment Questions 1. SYMPTOM: "Are you having difficulty swallowing liquids, solids, or both?"     Yes 2. ONSET: "When did the swallowing problems begin?"      September 3. CAUSE: "What do you think is causing the problem?"      Sick in September 4. CHRONIC/RECURRENT: "Is this a new problem for you?"  If no, ask: "How long have you had this problem?" (e.g., days, weeks, months)      No 5. OTHER SYMPTOMS: "Do you have any other symptoms?" (e.g., difficulty breathing, sore throat, swollen tongue, chest pain)     Nasal congestion 6. PREGNANCY: "Is there any chance you are pregnant?" "When was your last menstrual period?"     No  Protocols used: Swallowing Difficulty-A-AH

## 2021-04-19 ENCOUNTER — Other Ambulatory Visit: Payer: Self-pay

## 2021-04-19 MED ORDER — AZELASTINE HCL 0.1 % NA SOLN
NASAL | 4 refills | Status: DC
  Filled 2021-04-19: qty 30, 30d supply, fill #0

## 2021-05-03 ENCOUNTER — Other Ambulatory Visit: Payer: Self-pay

## 2021-05-22 ENCOUNTER — Encounter: Payer: Self-pay | Admitting: Nurse Practitioner

## 2021-05-22 ENCOUNTER — Ambulatory Visit (INDEPENDENT_AMBULATORY_CARE_PROVIDER_SITE_OTHER): Payer: 59 | Admitting: Nurse Practitioner

## 2021-05-22 VITALS — BP 140/82 | HR 67 | Ht 60.0 in | Wt 98.0 lb

## 2021-05-22 DIAGNOSIS — R1310 Dysphagia, unspecified: Secondary | ICD-10-CM

## 2021-05-22 NOTE — Progress Notes (Signed)
ASSESSMENT AND PLAN    # 60 yo female with two months of globus sensation and dysphagia to certain solid foods such as pasta and rice. No history of GERD. Sinus drainage can cause globus sensation but her sinus issues are chronic. She is a longtime tobacco smoker. Rule out esophageal stricture, esophageal neoplasm, esophageal dysmotility.  Weight down 4 pounds ( ~ 5% of TBW) since May.  --Scheduled for EGD with possible dilation. The risks and benefits of EGD with possible biopsies were discussed with the patient who agrees to proceed.  Dr. Loletha Carrow doesn't haven't any available endoscopy time until late December. Procedure will be done by Dr. Carlean Purl.   # Colon cancer screening. Colonoscopy in July 2019 but prep was poor with very limited visualization of the mucosa.  I will ask Dr. Loletha Carrow to decide on when he would like to repeat the colonoscopy.  In an effort to expedite dysphagia weight loss I did not add it on her upcoming EGD.  -- When colonoscopy does get scheduled patient will need a 2-day bowel prep  # History of HCV, s/p Harvoni ` 5 years ago. Sustained response --[place] with elastography 01/2017 - gallstones and F2/F3 fibrosis --MRI June 2022 - Some unusual liver findings ( ? benign intrahepatic shunts) but no reported cirrhosis..  # Chronic bloating / belching . She has occasional constipation. Large volume stool on MRI in June. Since then taking Miralax daily and bowels moving 2-3 times a day.   HISTORY OF PRESENT ILLNESS     Chief Complaint : Feels like something is always stuck in her throat and having swallowing problems  Lori Bush is a 60 y.o. female with a past medical history significant for tobacco abuse, treated, hypertension, hyperlipidemia, H. pylori related gastritis . See PMH below for any additional history.   Patient is referred by her PCP for evaluation of globus. She is also having problems swallowing over the last two months. She was evaluated by Dr. Loletha Carrow  in 2019 for early satiety and weight loss.  EGD remarkable for H. pylori related gastritis.  Following that she had a CT chest abdomen pelvis for evaluation of weight loss.  CT chest showed 2 small lung nodules, report sent to her PCP.  H. pylori was treated with quadruple therapy.  Follow-up H. pylori breath test was negative.  She also had a colonoscopy on the same day for EGD.  The prep was so poor that very little of the mucosa could be visualized   INTERVAL HISTORY:  Here with sensation that something is stuck in her throat over the last two months. Additionally, she describes solid food dysphagia  Certain foods such as rice and pasta "come back up" within a few minutes of swallowing. Softer food or more easily tolerated. She had oral surgery in June, teeth extracted for dentures. She got dentures immediately and they fit well so there are no issues with her chewing .   A month ago she took a a course of antibiotics for presumed sinus infection. Patient says she always has some type of sinus problem The antibiotics didn't change sinus drainage in back of throat nor the globus sensation. She hasn't been having any GERD    Past Medical History:  Diagnosis Date   Arthritis Dx 2012   Hyperlipidemia    Hypertension Dx 2005   Seasonal allergies      Past Surgical History:  Procedure Laterality Date   CESAREAN SECTION     COLONOSCOPY  WITH ESOPHAGOGASTRODUODENOSCOPY (EGD)  03/2007   HERNIA REPAIR  1971   as a child, umbilical   WISDOM TOOTH EXTRACTION     Family History  Problem Relation Age of Onset   Hypertension Mother    Heart disease Mother    Hypertension Father    Dementia Father    Arthritis Sister    Hypertension Sister    Arthritis Brother    Hypertension Brother    Colon cancer Neg Hx    Stomach cancer Neg Hx    Rectal cancer Neg Hx    Esophageal cancer Neg Hx    Social History   Tobacco Use   Smoking status: Every Day    Packs/day: 0.50    Types: Cigarettes    Smokeless tobacco: Never   Tobacco comments:    states she cut back 3 cigarettes/day  Vaping Use   Vaping Use: Never used  Substance Use Topics   Alcohol use: No   Drug use: No   Current Outpatient Medications  Medication Sig Dispense Refill   albuterol (VENTOLIN HFA) 108 (90 Base) MCG/ACT inhaler Inhale 2 puffs into the lungs every 4 (four) hours as needed for wheezing or shortness of breath. 18 g 0   amLODipine (NORVASC) 10 MG tablet Take 1 tablet (10 mg total) by mouth daily. Mush have office visit for refills 30 tablet 6   atorvastatin (LIPITOR) 10 MG tablet Take 1 tablet (10 mg total) by mouth daily. 90 tablet 3   azelastine (ASTELIN) 0.1 % nasal spray Spray 2 sprays twice a day by intranasal route. 30 mL 4   carvedilol (COREG) 25 MG tablet TAKE 1 TABLET (25 MG TOTAL) BY MOUTH 2 (TWO) TIMES DAILY WITH A MEAL. 180 tablet 3   hydrALAZINE (APRESOLINE) 25 MG tablet Take 1 tablet (25 mg total) by mouth 3 (three) times daily. Start after Losartan/Hydrochlorothiazide combination pill is finished. 90 tablet 5   ibuprofen (ADVIL) 600 MG tablet TAKE 1 TABLET EVERY 6 HOURS AS NEEDED. 30 tablet 0   loratadine (CLARITIN) 10 MG tablet Take 1 tablet (10 mg total) by mouth daily. 30 tablet 11   losartan (COZAAR) 100 MG tablet Take 1 tablet (100 mg total) by mouth daily. Start after you complete the bottle of Losartan/Hydrochlorothiazide 30 tablet 5   nicotine (NICOTINE STEP 1) 21 mg/24hr patch Place 1 patch (21 mg total) onto the skin daily. 28 patch 0   nicotine polacrilex (NICORETTE) 2 MG gum Take 1 each (2 mg total) by mouth as needed for smoking cessation. 100 tablet 0   No current facility-administered medications for this visit.   Allergies  Allergen Reactions   Hydrochlorothiazide     Causes low Potassium and high calcium.     Review of Systems: All systems reviewed and negative except where noted in HPI.    PHYSICAL EXAM :    Wt Readings from Last 3 Encounters:  05/22/21 98 lb  (44.5 kg)  03/19/21 98 lb (44.5 kg)  01/11/21 99 lb (44.9 kg)    BP 140/82   Pulse 67   Ht 5' (1.524 m)   Wt 98 lb (44.5 kg)   BMI 19.14 kg/m  Constitutional:  Generally well appearing but thin female in no acute distress. Psychiatric: Pleasant. Normal mood and affect. Behavior is normal. EENT: Pupils normal.  Conjunctivae are normal. No scleral icterus. No oral lesions seen Neck supple.  Cardiovascular: Normal rate, regular rhythm. No edema Pulmonary/chest: Effort normal and breath sounds normal. No wheezing, rales  or rhonchi. Abdominal: Soft, nondistended, nontender. Bowel sounds active throughout. There are no masses palpable. No hepatomegaly. Neurological: Alert and oriented to person place and time. Skin: Skin is warm and dry. No rashes noted.  Tye Savoy, NP  05/22/2021, 2:19 PM  Cc:  Referring Provider Ladell Pier, MD

## 2021-05-22 NOTE — Patient Instructions (Signed)
If you are age 60 or younger, your body mass index should be between 19-25. Your Body mass index is 19.14 kg/m. If this is out of the aformentioned range listed, please consider follow up with your Primary Care Provider.  ________________________________________________________  The [facility] providers would like to encourage you to use [facility] to communicate with providers for non-urgent requests or questions.  Due to long hold times on the telephone, sending your provider a message by [facility] may be a faster and more efficient way to get a response.  Please allow 48 business hours for a response.  Please remember that this is for non-urgent requests.  _______________________________________________________  Dennis Bast have been scheduled for an endoscopy. Please follow written instructions given to you at your visit today. If you use inhalers (even only as needed), please bring them with you on the day of your procedure.  Follow up pending the result of you Endoscopy.  Thank you for entrusting me with your care and choosing [facility].  Tye Savoy, NP-C

## 2021-05-24 ENCOUNTER — Encounter: Payer: Self-pay | Admitting: Internal Medicine

## 2021-05-28 ENCOUNTER — Ambulatory Visit (AMBULATORY_SURGERY_CENTER): Payer: Self-pay | Admitting: Internal Medicine

## 2021-05-28 ENCOUNTER — Encounter: Payer: Self-pay | Admitting: Internal Medicine

## 2021-05-28 VITALS — BP 142/94 | HR 73 | Temp 98.6°F | Resp 11 | Ht 60.0 in | Wt 98.0 lb

## 2021-05-28 DIAGNOSIS — R1310 Dysphagia, unspecified: Secondary | ICD-10-CM

## 2021-05-28 MED ORDER — SODIUM CHLORIDE 0.9 % IV SOLN: 500.0000 mL | Freq: Once | INTRAVENOUS | Status: DC

## 2021-05-28 NOTE — Progress Notes (Signed)
A and O x3. Report to RN. Tolerated MAC anesthesia well.Teeth unchanged after procedure. 

## 2021-05-28 NOTE — Progress Notes (Signed)
History and Physical Interval Note:  05/28/2021 2:51 PM  Lori Bush  has presented today for endoscopic procedure(s), with the diagnosis of  Encounter Diagnosis  Name Primary?   Dysphagia, unspecified type Yes  .  The various methods of evaluation and treatment have been discussed with the patient and/or family. After consideration of risks, benefits and other options for treatment, the patient has consented to  the endoscopic procedure(s).   The patient's history has been reviewed, patient examined, no change in status, stable for endoscopic procedure(s).  I have reviewed the patient's chart and labs.  Questions were answered to the patient's satisfaction.     Gatha Mayer, MD, [facility]

## 2021-05-28 NOTE — Patient Instructions (Addendum)
I did not see any problems but I did stretch or dilate the esophagus to see if that will help you.  We will arrange for you to have a colonoscopy.  I appreciate the opportunity to care for you. Gatha Mayer, MD, [facility]   Dysphagia diet - clear liquids X 1 hour then soft the remainder of the day. Previsit appointment scheduled for December 1st @ 11 am Screening colonoscopy scheduled for December 14th @ 8 am - you will arrive by 7 am     YOU HAD AN ENDOSCOPIC PROCEDURE TODAY AT [facility]:   Refer to the procedure report that was given to you for any specific questions about what was found during the examination.  If the procedure report does not answer your questions, please call your gastroenterologist to clarify.  If you requested that your care partner not be given the details of your procedure findings, then the procedure report has been included in a sealed envelope for you to review at your convenience later.  YOU SHOULD EXPECT: Some feelings of bloating in the abdomen. Passage of more gas than usual.  Walking can help get rid of the air that was put into your GI tract during the procedure and reduce the bloating. If you had a lower endoscopy (such as a colonoscopy or flexible sigmoidoscopy) you may notice spotting of blood in your stool or on the toilet paper. If you underwent a bowel prep for your procedure, you may not have a normal bowel movement for a few days.  Please Note:  You might notice some irritation and congestion in your nose or some drainage.  This is from the oxygen used during your procedure.  There is no need for concern and it should clear up in a day or so.  SYMPTOMS TO REPORT IMMEDIATELY:  Following upper endoscopy (EGD)  Vomiting of blood or coffee ground material  New chest pain or pain under the shoulder blades  Painful or persistently difficult swallowing  New shortness of breath  Fever of 100F or higher  Black, tarry-looking  stools  For urgent or emergent issues, a gastroenterologist can be reached at any hour by calling [phone]. Do not use MyChart messaging for urgent concerns.    DIET:  We do recommend a small meal at first, but then you may proceed to your regular diet.  Drink plenty of fluids but you should avoid alcoholic beverages for 24 hours.  ACTIVITY:  You should plan to take it easy for the rest of today and you should NOT DRIVE or use heavy machinery until tomorrow (because of the sedation medicines used during the test).    FOLLOW UP: Our staff will call the number listed on your records 48-72 hours following your procedure to check on you and address any questions or concerns that you may have regarding the information given to you following your procedure. If we do not reach you, we will leave a message.  We will attempt to reach you two times.  During this call, we will ask if you have developed any symptoms of COVID 19. If you develop any symptoms (ie: fever, flu-like symptoms, shortness of breath, cough etc.) before then, please call [phone].  If you test positive for Covid 19 in the 2 weeks post procedure, please call and report this information to [place].    If any biopsies were taken you will be contacted by phone or by letter within the next 1-3 weeks.  Please  call us at [phone] if you have not heard about the biopsies in 3 weeks.    SIGNATURES/CONFIDENTIALITY: You and/or your care partner have signed paperwork which will be entered into your electronic medical record.  These signatures attest to the fact that that the information above on your After Visit Summary has been reviewed and is understood.  Full responsibility of the confidentiality of this discharge information lies with you and/or your care-partner.

## 2021-05-28 NOTE — Progress Notes (Signed)
Called to room to assist during endoscopic procedure.  Patient ID and intended procedure confirmed with present staff. Received instructions for my participation in the procedure from the performing physician.  

## 2021-05-28 NOTE — Op Note (Signed)
[place] Patient Name: Lori Bush Procedure Date: 05/28/2021 2:53 PM MRN: [id number] Endoscopist: Gatha Mayer , MD Age: 60 Referring MD:  Date of Birth: 10/14/1960 Gender: Female Account #: [id number] Procedure:                Upper GI endoscopy Indications:              Dysphagia, Globus sensation Medicines:                Propofol per Anesthesia, Monitored Anesthesia Care Procedure:                Pre-Anesthesia Assessment:                           - Prior to the procedure, a History and Physical                            was performed, and patient medications and                            allergies were reviewed. The patient's tolerance of                            previous anesthesia was also reviewed. The risks                            and benefits of the procedure and the sedation                            options and risks were discussed with the patient.                            All questions were answered, and informed consent                            was obtained. Prior Anticoagulants: The patient has                            taken no previous anticoagulant or antiplatelet                            agents. ASA Grade Assessment: II - A patient with                            mild systemic disease. After reviewing the risks                            and benefits, the patient was deemed in                            satisfactory condition to undergo the procedure.                           After obtaining informed consent, the endoscope was  passed under direct vision. Throughout the                            procedure, the patient's blood pressure, pulse, and                            oxygen saturations were monitored continuously. The                            GIF [id number] #[id number] was introduced through the                            mouth, and advanced to the second part of duodenum.                             The upper GI endoscopy was accomplished without                            difficulty. The patient tolerated the procedure                            well. Scope In: Scope Out: Findings:                 No endoscopic abnormality was evident in the                            esophagus to explain the patient's complaint of                            dysphagia. It was decided, however, to proceed with                            dilation of the entire esophagus. The scope was                            withdrawn. Dilation was performed with a Maloney                            dilator with mild resistance at 16 Fr. The dilation                            site was examined following endoscope reinsertion                            and showed no change. Estimated blood loss: none.                           The exam was otherwise without abnormality.                           The cardia and gastric fundus were normal on  retroflexion. Photo omitted. Complications:            No immediate complications. Estimated Blood Loss:     Estimated blood loss: none. Impression:               - No endoscopic esophageal abnormality to explain                            patient's dysphagia. Esophagus dilated. Dilated.                           - The examination was otherwise normal.                           - No specimens collected. Recommendation:           - Patient has a contact number available for                            emergencies. The signs and symptoms of potential                            delayed complications were discussed with the                            patient. Return to normal activities tomorrow.                            Written discharge instructions were provided to the                            patient.                           - Clear liquids x 1 hour then soft foods rest of                            day. Start prior diet tomorrow.                            - Continue present medications.                           -                           Schedule screening colonoscopy with Dr. Loletha Carrow -                            needs double prep as 2019 exam inadequate due to                            prep issues Gatha Mayer, MD 05/28/2021 3:08:45 PM This report has been signed electronically.

## 2021-05-29 NOTE — Progress Notes (Signed)
____________________________________________________________  Attending physician addendum:  Thank you for sending this case to me. I have reviewed the entire note and agree with the plan.  Dr Celesta Aver EGD normal yesterday, empiric 12 French dilation performed. Globus sensation may at least in part be due to cricopharyngeal dysfunction and a smoker. Will discuss it further with her at her upcoming colonoscopy in order to determine if esophageal manometry required.  Wilfrid Lund, MD  ____________________________________________________________

## 2021-05-30 ENCOUNTER — Telehealth: Payer: Self-pay

## 2021-05-30 NOTE — Telephone Encounter (Signed)
  Follow up Call-  Call back number 05/28/2021  Post procedure Call Back phone  # [phone]  Permission to leave phone message Yes  Some recent data might be hidden     Patient questions:  Do you have a fever, pain , or abdominal swelling? No. Pain Score  0 *  Have you tolerated food without any problems? Yes.    Have you been able to return to your normal activities? Yes.    Do you have any questions about your discharge instructions: Diet   No. Medications  No. Follow up visit  No.  Do you have questions or concerns about your Care? No.  Actions: * If pain score is 4 or above: No action needed, pain <4.

## 2021-06-04 ENCOUNTER — Other Ambulatory Visit: Payer: Self-pay

## 2021-06-05 ENCOUNTER — Other Ambulatory Visit: Payer: Self-pay

## 2021-06-07 ENCOUNTER — Other Ambulatory Visit: Payer: Self-pay | Admitting: Pharmacist

## 2021-06-07 ENCOUNTER — Other Ambulatory Visit: Payer: Self-pay

## 2021-06-07 ENCOUNTER — Ambulatory Visit (AMBULATORY_SURGERY_CENTER): Payer: Self-pay

## 2021-06-07 ENCOUNTER — Other Ambulatory Visit: Payer: Self-pay | Admitting: Internal Medicine

## 2021-06-07 VITALS — Ht 60.0 in | Wt 98.0 lb

## 2021-06-07 DIAGNOSIS — Z1211 Encounter for screening for malignant neoplasm of colon: Secondary | ICD-10-CM

## 2021-06-07 DIAGNOSIS — R053 Chronic cough: Secondary | ICD-10-CM

## 2021-06-07 MED ORDER — ALBUTEROL SULFATE HFA 108 (90 BASE) MCG/ACT IN AERS
2.0000 | INHALATION_SPRAY | RESPIRATORY_TRACT | 2 refills | Status: DC | PRN
  Filled 2021-06-07 – 2021-12-13 (×3): qty 18, 16d supply, fill #0
  Filled 2022-05-14: qty 6.7, 17d supply, fill #0

## 2021-06-07 MED ORDER — PLENVU 140 G PO SOLR
1.0000 | ORAL | 0 refills | Status: DC
Start: 2021-06-07 — End: 2021-12-20
  Filled 2021-06-07: qty 3, 2d supply, fill #0

## 2021-06-07 NOTE — Progress Notes (Signed)

## 2021-06-08 ENCOUNTER — Other Ambulatory Visit: Payer: Self-pay

## 2021-06-13 NOTE — Telephone Encounter (Signed)
Attempted f/u call, will try again this afternoon.

## 2021-06-14 ENCOUNTER — Other Ambulatory Visit: Payer: Self-pay

## 2021-06-20 ENCOUNTER — Encounter: Payer: Self-pay | Admitting: Gastroenterology

## 2021-06-21 ENCOUNTER — Other Ambulatory Visit: Payer: Self-pay

## 2021-06-21 ENCOUNTER — Emergency Department (HOSPITAL_COMMUNITY)
Admission: EM | Admit: 2021-06-21 | Discharge: 2021-06-21 | Disposition: A | Discharge status: Home / Self Care | Payer: 59 | Attending: Emergency Medicine | Admitting: Emergency Medicine

## 2021-06-21 DIAGNOSIS — R0989 Other specified symptoms and signs involving the circulatory and respiratory systems: Secondary | ICD-10-CM

## 2021-06-21 DIAGNOSIS — R052 Subacute cough: Secondary | ICD-10-CM

## 2021-06-21 DIAGNOSIS — F17210 Nicotine dependence, cigarettes, uncomplicated: Secondary | ICD-10-CM | POA: Insufficient documentation

## 2021-06-21 DIAGNOSIS — F458 Other somatoform disorders: Secondary | ICD-10-CM | POA: Insufficient documentation

## 2021-06-21 DIAGNOSIS — I10 Essential (primary) hypertension: Secondary | ICD-10-CM | POA: Insufficient documentation

## 2021-06-21 DIAGNOSIS — Z79899 Other long term (current) drug therapy: Secondary | ICD-10-CM | POA: Insufficient documentation

## 2021-06-21 MED ORDER — FLUTICASONE PROPIONATE 50 MCG/ACT NA SUSP
1.0000 | Freq: Every day | NASAL | 0 refills | Status: DC
  Filled 2021-06-21: qty 16, 30d supply, fill #0

## 2021-06-21 MED ORDER — PANTOPRAZOLE SODIUM 20 MG PO TBEC
40.0000 mg | DELAYED_RELEASE_TABLET | Freq: Every day | ORAL | 0 refills | Status: DC
  Filled 2021-06-21: qty 60, 30d supply, fill #0

## 2021-06-21 NOTE — ED Notes (Signed)
Pt denies having trouble swallowing food or drink at this time.

## 2021-06-21 NOTE — ED Triage Notes (Signed)
Pt. Stated, I have felt like Ive hads something in my throat for the last 2 months, 2 weeks ago I had an Endoscopy and showed nothing. It still feels like something is there.

## 2021-06-21 NOTE — Discharge Instructions (Addendum)
Please follow up with ENT and your PCP as scheduled. Pick up your prescriptions for pantoprazole and fluticasone to help with gastric reflux and postnasal drip respectfully. Advise smoking cessation. Return to the ED with worsening chest pain, shortness of breath, fevers, inability to tolerate secretions.

## 2021-06-21 NOTE — ED Provider Notes (Signed)
[facility] EMERGENCY DEPARTMENT Provider Note   CSN: [id number] Arrival date & time: 06/21/21  1002     History Chief Complaint  Patient presents with   Dysphagia    Lori Bush is a 60 y.o. female.  The history is provided by the patient and medical records.  Cough Cough characteristics:  Non-productive Sputum characteristics:  Nondescript Severity:  Mild Onset quality:  Gradual Duration: 2 months. Timing:  Constant Progression:  Unchanged Chronicity:  New Associated symptoms: no chest pain, no chills, no ear pain, no fever, no rash, no shortness of breath and no sore throat   Illness Location:  Throat Quality:  Foreign body sensation Severity:  Mild Onset quality:  Gradual Duration: 2 months. Progression:  Unchanged Context:  Recent EGD w/ GI Relieved by:  None Worsened by:  None Associated symptoms: congestion and cough   Associated symptoms: no abdominal pain, no chest pain, no ear pain, no fever, no rash, no shortness of breath, no sore throat and no vomiting       Past Medical History:  Diagnosis Date   Arthritis Dx 2012   Hyperlipidemia    Hypertension Dx 2005   Seasonal allergies     Patient Active Problem List   Diagnosis Date Noted   Elevated blood pressure reading with diagnosis of hypertension 03/19/2021   Aortic atherosclerosis ([place]) 11/27/2020   Renal mass 10/93/2355   Helicobacter pylori (H. pylori) infection 02/27/2018   Lung nodule, multiple 02/27/2018   Tobacco dependence 03/17/2017   Cyst of skin 03/17/2017   Primary osteoarthritis of right knee 10/28/2016   Chronic hepatitis C without hepatic coma ([place]) 03/26/2016   Chronic right shoulder pain 01/17/2015   Hypertension 03/05/2013    Past Surgical History:  Procedure Laterality Date   CESAREAN SECTION     COLONOSCOPY     COLONOSCOPY WITH ESOPHAGOGASTRODUODENOSCOPY (EGD)  03/2007   HERNIA REPAIR  1971   as a child, umbilical   WISDOM TOOTH EXTRACTION        OB History   No obstetric history on file.     Family History  Problem Relation Age of Onset   Hypertension Mother    Heart disease Mother    Hypertension Father    Dementia Father    Arthritis Sister    Hypertension Sister    Arthritis Brother    Hypertension Brother    Colon cancer Neg Hx    Stomach cancer Neg Hx    Rectal cancer Neg Hx    Esophageal cancer Neg Hx    Colon polyps Neg Hx     Social History   Tobacco Use   Smoking status: Every Day    Packs/day: 0.50    Types: Cigarettes   Smokeless tobacco: Never   Tobacco comments:    states she cut back 3 cigarettes/day  Vaping Use   Vaping Use: Never used  Substance Use Topics   Alcohol use: No   Drug use: No    Home Medications Prior to Admission medications   Medication Sig Start Date End Date Taking? Authorizing Provider  amLODipine (NORVASC) 10 MG tablet Take 1 tablet (10 mg total) by mouth daily. Mush have office visit for refills 01/11/21  Yes Ladell Pier, MD  atorvastatin (LIPITOR) 10 MG tablet Take 1 tablet (10 mg total) by mouth daily. 11/22/20  Yes Ladell Pier, MD  carvedilol (COREG) 25 MG tablet TAKE 1 TABLET (25 MG TOTAL) BY MOUTH 2 (TWO) TIMES DAILY WITH A  MEAL. Patient taking differently: Take 25 mg by mouth 2 (two) times daily with a meal. 11/13/20 11/13/21 Yes Ladell Pier, MD  fluticasone (FLONASE) 50 MCG/ACT nasal spray Place 2 sprays into both nostrils daily.   Yes [provider]  fluticasone (FLONASE) 50 MCG/ACT nasal spray Place 1 spray into both nostrils daily. 06/21/21 07/21/21 Yes Daneshia Tavano, Burnadette Peter, MD  hydrALAZINE (APRESOLINE) 25 MG tablet Take 1 tablet (25 mg total) by mouth 3 (three) times daily. Start after Losartan/Hydrochlorothiazide combination pill is finished. 11/22/20  Yes Ladell Pier, MD  ibuprofen (ADVIL) 600 MG tablet TAKE 1 TABLET EVERY 6 HOURS AS NEEDED. Patient taking differently: Take 600 mg by mouth every 6 (six) hours as needed for  headache or mild pain. 12/27/20  Yes   loratadine (CLARITIN) 10 MG tablet Take 1 tablet (10 mg total) by mouth daily. Patient taking differently: Take 10 mg by mouth daily as needed for allergies or rhinitis. 03/19/21  Yes Mayers, Cari S, PA-C  losartan (COZAAR) 100 MG tablet Take 1 tablet (100 mg total) by mouth daily. Start after you complete the bottle of Losartan/Hydrochlorothiazide 11/22/20  Yes Ladell Pier, MD  pantoprazole (PROTONIX) 20 MG tablet Take 2 tablets (40 mg total) by mouth daily. 06/21/21 07/21/21 Yes Nioma Mccubbins, Burnadette Peter, MD  albuterol (VENTOLIN HFA) 108 (90 Base) MCG/ACT inhaler Inhale 2 puffs into the lungs every 4 (four) hours as needed for wheezing or shortness of breath. 06/07/21   Ladell Pier, MD  azelastine (ASTELIN) 0.1 % nasal spray Spray 2 sprays twice a day by intranasal route. Patient not taking: Reported on 06/21/2021 04/19/21     nicotine (NICOTINE STEP 1) 21 mg/24hr patch Place 1 patch (21 mg total) onto the skin daily. Patient not taking: Reported on 05/28/2021 11/13/20   Ladell Pier, MD  nicotine polacrilex (NICORETTE) 2 MG gum Take 1 each (2 mg total) by mouth as needed for smoking cessation. Patient not taking: Reported on 05/28/2021 11/13/20   Ladell Pier, MD  PEG-KCl-NaCl-NaSulf-Na Asc-C (PLENVU) 140 g SOLR Take 1 kit by mouth as directed. 06/07/21   Doran Stabler, MD    Allergies    Hydrochlorothiazide  Review of Systems   Review of Systems  Constitutional:  Negative for chills and fever.  HENT:  Positive for congestion and postnasal drip. Negative for ear pain and sore throat.   Eyes:  Negative for pain and visual disturbance.  Respiratory:  Positive for cough. Negative for shortness of breath.   Cardiovascular:  Negative for chest pain and palpitations.  Gastrointestinal:  Negative for abdominal pain and vomiting.  Genitourinary:  Negative for dysuria and hematuria.  Musculoskeletal:  Negative for arthralgias and back pain.   Skin:  Negative for color change and rash.  Neurological:  Negative for seizures and syncope.  All other systems reviewed and are negative.  Physical Exam Updated Vital Signs BP (!) 190/112    Pulse 68    Temp 98.3 F (36.8 C)    Resp 20    Ht 5' (1.524 m)    Wt 44.5 kg    SpO2 100%    BMI 19.16 kg/m   Physical Exam Vitals and nursing note reviewed.  Constitutional:      General: She is not in acute distress.    Appearance: Normal appearance. She is well-developed.  HENT:     Head: Normocephalic and atraumatic.     Right Ear: External ear normal.     Left Ear: External  ear normal.     Nose: Nose normal. No congestion or rhinorrhea.     Mouth/Throat:     Mouth: Mucous membranes are moist.     Pharynx: Oropharynx is clear. No posterior oropharyngeal erythema.  Eyes:     Extraocular Movements: Extraocular movements intact.     Conjunctiva/sclera: Conjunctivae normal.     Pupils: Pupils are equal, round, and reactive to light.  Cardiovascular:     Rate and Rhythm: Normal rate and regular rhythm.     Pulses: Normal pulses.     Heart sounds: No murmur heard. Pulmonary:     Effort: Pulmonary effort is normal. No respiratory distress.     Breath sounds: Normal breath sounds. No wheezing, rhonchi or rales.  Abdominal:     General: Abdomen is flat. Bowel sounds are normal.     Palpations: Abdomen is soft.     Tenderness: There is no abdominal tenderness. There is no guarding or rebound.  Musculoskeletal:        General: No swelling, tenderness or deformity.     Cervical back: Normal range of motion and neck supple. No rigidity.  Skin:    General: Skin is warm and dry.     Capillary Refill: Capillary refill takes less than 2 seconds.  Neurological:     General: No focal deficit present.     Mental Status: She is alert and oriented to person, place, and time.     Cranial Nerves: No cranial nerve deficit.     Sensory: No sensory deficit.     Motor: No weakness.  Psychiatric:         Mood and Affect: Mood normal.    ED Results / Procedures / Treatments   Labs (all labs ordered are listed, but only abnormal results are displayed) Labs Reviewed - No data to display  EKG None  Radiology No results found.  Procedures Procedures   Medications Ordered in ED Medications - No data to display  ED Course  I have reviewed the triage vital signs and the nursing notes.  Pertinent labs & imaging results that were available during my care of the patient were reviewed by me and considered in my medical decision making (see chart for details).    MDM Rules/Calculators/A&P                          60 y/o female w/ 2 months of globus sensation and cough. Recent EGD w/ GI reviewed. No obvious strictures or other acute findings noted. She has since been referred to ENT for management of sinus drainage. She is afebrile, HDS here. Airway intact. Tolerating secretions. Lungs CTAB.  Low concern for pneumonia, COPD exacerbation, or other infectious etiology.  Patient's chronic cough and globus sensation possibly related to reflux or PND. Will start on PPI and nasal steroids. Also encouraged smoking cessation.  Patient comfortable plan.  She is to follow-up with ENT and PCP as scheduled.  Strict return precautions provided.  Final Clinical Impression(s) / ED Diagnoses Final diagnoses:  Subacute cough  Globus sensation    Rx / DC Orders ED Discharge Orders          Ordered    pantoprazole (PROTONIX) 20 MG tablet  Daily        06/21/21 1553    fluticasone (FLONASE) 50 MCG/ACT nasal spray  Daily        06/21/21 1553  Idamae Lusher, MD 06/21/21 1717    Truddie Hidden, MD 06/21/21 2255

## 2021-06-22 ENCOUNTER — Other Ambulatory Visit: Payer: Self-pay

## 2021-06-27 ENCOUNTER — Encounter (HOSPITAL_BASED_OUTPATIENT_CLINIC_OR_DEPARTMENT_OTHER): Payer: Self-pay | Admitting: Emergency Medicine

## 2021-06-27 ENCOUNTER — Other Ambulatory Visit: Payer: Self-pay

## 2021-06-27 ENCOUNTER — Ambulatory Visit: Payer: Self-pay | Admitting: *Deleted

## 2021-06-27 ENCOUNTER — Emergency Department (HOSPITAL_BASED_OUTPATIENT_CLINIC_OR_DEPARTMENT_OTHER): Payer: Self-pay

## 2021-06-27 ENCOUNTER — Emergency Department (HOSPITAL_BASED_OUTPATIENT_CLINIC_OR_DEPARTMENT_OTHER)
Admission: EM | Admit: 2021-06-27 | Discharge: 2021-06-27 | Disposition: A | Discharge status: Home / Self Care | Payer: Self-pay | Attending: Emergency Medicine | Admitting: Emergency Medicine

## 2021-06-27 DIAGNOSIS — Z20822 Contact with and (suspected) exposure to covid-19: Secondary | ICD-10-CM | POA: Insufficient documentation

## 2021-06-27 DIAGNOSIS — Z87891 Personal history of nicotine dependence: Secondary | ICD-10-CM | POA: Insufficient documentation

## 2021-06-27 DIAGNOSIS — J40 Bronchitis, not specified as acute or chronic: Secondary | ICD-10-CM | POA: Insufficient documentation

## 2021-06-27 DIAGNOSIS — I10 Essential (primary) hypertension: Secondary | ICD-10-CM | POA: Insufficient documentation

## 2021-06-27 DIAGNOSIS — Z79899 Other long term (current) drug therapy: Secondary | ICD-10-CM | POA: Insufficient documentation

## 2021-06-27 DIAGNOSIS — R7989 Other specified abnormal findings of blood chemistry: Secondary | ICD-10-CM | POA: Insufficient documentation

## 2021-06-27 LAB — CBC WITH DIFFERENTIAL/PLATELET
Abs Immature Granulocytes: 0.01 10*3/uL (ref 0.00–0.07)
Basophils Absolute: 0.1 10*3/uL (ref 0.0–0.1)
Basophils Relative: 1 %
Eosinophils Absolute: 0.4 10*3/uL (ref 0.0–0.5)
Eosinophils Relative: 5 %
HCT: 40.6 % (ref 36.0–46.0)
Hemoglobin: 13.7 g/dL (ref 12.0–15.0)
Immature Granulocytes: 0 %
Lymphocytes Relative: 33 %
Lymphs Abs: 2.4 10*3/uL (ref 0.7–4.0)
MCH: 29.1 pg (ref 26.0–34.0)
MCHC: 33.7 g/dL (ref 30.0–36.0)
MCV: 86.4 fL (ref 80.0–100.0)
Monocytes Absolute: 0.6 10*3/uL (ref 0.1–1.0)
Monocytes Relative: 8 %
Neutro Abs: 3.7 10*3/uL (ref 1.7–7.7)
Neutrophils Relative %: 53 %
Platelets: 235 10*3/uL (ref 150–400)
RBC: 4.7 MIL/uL (ref 3.87–5.11)
RDW: 13.2 % (ref 11.5–15.5)
WBC: 7.2 10*3/uL (ref 4.0–10.5)
nRBC: 0 % (ref 0.0–0.2)

## 2021-06-27 LAB — COMPREHENSIVE METABOLIC PANEL
ALT: 12 U/L (ref 0–44)
AST: 18 U/L (ref 15–41)
Albumin: 4.4 g/dL (ref 3.5–5.0)
Alkaline Phosphatase: 63 U/L (ref 38–126)
Anion gap: 11 (ref 5–15)
BUN: 13 mg/dL (ref 6–20)
CO2: 25 mmol/L (ref 22–32)
Calcium: 10.4 mg/dL — ABNORMAL HIGH (ref 8.9–10.3)
Chloride: 106 mmol/L (ref 98–111)
Creatinine, Ser: 0.83 mg/dL (ref 0.44–1.00)
GFR, Estimated: >60 mL/min (ref >60)
Glucose, Bld: 88 mg/dL (ref 70–99)
Potassium: 3.5 mmol/L (ref 3.5–5.1)
Sodium: 142 mmol/L (ref 135–145)
Total Bilirubin: 0.6 mg/dL (ref 0.3–1.2)
Total Protein: 7.1 g/dL (ref 6.5–8.1)

## 2021-06-27 LAB — RESP PANEL BY RT-PCR (FLU A&B, COVID) ARPGX2
Influenza A by PCR: NEGATIVE
Influenza B by PCR: NEGATIVE
SARS Coronavirus 2 by RT PCR: NEGATIVE

## 2021-06-27 LAB — TROPONIN I (HIGH SENSITIVITY): Troponin I (High Sensitivity): 7 ng/L (ref <18)

## 2021-06-27 LAB — BRAIN NATRIURETIC PEPTIDE: B Natriuretic Peptide: 160.7 pg/mL — ABNORMAL HIGH (ref 0.0–100.0)

## 2021-06-27 MED ORDER — ALBUTEROL SULFATE HFA 108 (90 BASE) MCG/ACT IN AERS: 4.0000 | INHALATION_SPRAY | Freq: Once | RESPIRATORY_TRACT | Status: DC

## 2021-06-27 MED ORDER — PREDNISONE 10 MG PO TABS: 40.0000 mg | ORAL_TABLET | Freq: Every day | ORAL | 0 refills | Status: AC

## 2021-06-27 MED ORDER — AZITHROMYCIN 250 MG PO TABS
500.0000 mg | ORAL_TABLET | Freq: Every day | ORAL | 0 refills | Status: DC
  Filled 2021-06-27: qty 10, 5d supply, fill #0

## 2021-06-27 MED ORDER — PROMETHAZINE-DM 6.25-15 MG/5ML PO SYRP: 5.0000 mL | ORAL_SOLUTION | Freq: Four times a day (QID) | ORAL | 0 refills | Status: AC | PRN

## 2021-06-27 MED ORDER — METHYLPREDNISOLONE SODIUM SUCC 125 MG IJ SOLR
125.0000 mg | Freq: Once | INTRAMUSCULAR | Status: AC
  Administered 2021-06-27: 16:00:00 125 mg via INTRAVENOUS
  Filled 2021-06-27: qty 2

## 2021-06-27 MED ORDER — PREDNISONE 10 MG PO TABS
40.0000 mg | ORAL_TABLET | Freq: Every day | ORAL | 0 refills | Status: DC
  Filled 2021-06-27: qty 20, 5d supply, fill #0

## 2021-06-27 MED ORDER — AZITHROMYCIN 250 MG PO TABS: 500.0000 mg | ORAL_TABLET | Freq: Every day | ORAL | 0 refills | Status: AC

## 2021-06-27 MED ORDER — ALBUTEROL SULFATE (2.5 MG/3ML) 0.083% IN NEBU
5.0000 mg | INHALATION_SOLUTION | Freq: Once | RESPIRATORY_TRACT | Status: AC
  Administered 2021-06-27: 15:00:00 5 mg via RESPIRATORY_TRACT
  Filled 2021-06-27: qty 6

## 2021-06-27 MED ORDER — ALBUTEROL SULFATE (2.5 MG/3ML) 0.083% IN NEBU: 3.0000 mL | INHALATION_SOLUTION | RESPIRATORY_TRACT | Status: DC | PRN

## 2021-06-27 MED ORDER — PROMETHAZINE-DM 6.25-15 MG/5ML PO SYRP
5.0000 mL | ORAL_SOLUTION | Freq: Four times a day (QID) | ORAL | 0 refills | Status: DC | PRN
  Filled 2021-06-27: qty 118, 6d supply, fill #0

## 2021-06-27 NOTE — Discharge Instructions (Addendum)
Please pick up medications from the pharmacy. I have prescribed you 5 days of antibiotics, steroids, and cough medication.  Please follow up with your PCP.  Please return to the ED with worsening shortness of breath or chest pain

## 2021-06-27 NOTE — ED Provider Notes (Signed)
°[facility] EMERGENCY DEPT °Provider Note ° ° °CSN: [id number] °Arrival date & time: 06/27/21  1357 ° °  ° °History °Chief Complaint  °Patient presents with  ° Shortness of Breath  ° ° °Lori Bush is a 60 y.o. female. °Patient presents with cough, congestion, and shortness of breath.  She states that she was in the emergency room on December 15 with a globus sensation in her throat.  At that time it was attributed to GERD and she was given medications that have somewhat improved the globus symptoms, however over the past couple of days she has had worsening shortness of breath, productive cough, nasal congestion.  She has a home albuterol inhaler that she takes when she has the symptoms and has had some transient relief of symptoms.  She denies any fevers, chills, sore throat, ear pain, chest tightness, chest pain, abdominal pain, nausea, vomiting.  She denies any history of pulmonary disease.  She has a previous smoker, however, she recently quit on December 15 of this year. ° ° ° °Shortness of Breath °Associated symptoms: cough   °Associated symptoms: no abdominal pain, no chest pain, no ear pain, no fever, no headaches, no vomiting and no wheezing   ° °  ° °Past Medical History:  °Diagnosis Date  ° Arthritis Dx 2012  ° Hyperlipidemia   ° Hypertension Dx 2005  ° Seasonal allergies   ° ° °Patient Active Problem List  ° Diagnosis Date Noted  ° Elevated blood pressure reading with diagnosis of hypertension 03/19/2021  ° Aortic atherosclerosis ([facility]) 11/27/2020  ° Renal mass 02/27/2018  ° Helicobacter pylori (H. pylori) infection 02/27/2018  ° Lung nodule, multiple 02/27/2018  ° Tobacco dependence 03/17/2017  ° Cyst of skin 03/17/2017  ° Primary osteoarthritis of right knee 10/28/2016  ° Chronic hepatitis C without hepatic coma ([facility]) 03/26/2016  ° Chronic right shoulder pain 01/17/2015  ° Hypertension 03/05/2013  ° ° °Past Surgical History:  °Procedure Laterality Date  ° CESAREAN SECTION    °  COLONOSCOPY    ° COLONOSCOPY WITH ESOPHAGOGASTRODUODENOSCOPY (EGD)  03/2007  ° HERNIA REPAIR  1971  ° as a child, umbilical  ° WISDOM TOOTH EXTRACTION    °  ° °OB History   °No obstetric history on file. °  ° ° °Family History  °Problem Relation Age of Onset  ° Hypertension Mother   ° Heart disease Mother   ° Hypertension Father   ° Dementia Father   ° Arthritis Sister   ° Hypertension Sister   ° Arthritis Brother   ° Hypertension Brother   ° Colon cancer Neg Hx   ° Stomach cancer Neg Hx   ° Rectal cancer Neg Hx   ° Esophageal cancer Neg Hx   ° Colon polyps Neg Hx   ° ° °Social History  ° °Tobacco Use  ° Smoking status: Former  °  Packs/day: 0.50  °  Types: Cigarettes  ° Smokeless tobacco: Never  ° Tobacco comments:  °  states she cut back 3 cigarettes/day  °Vaping Use  ° Vaping Use: Never used  °Substance Use Topics  ° Alcohol use: No  ° Drug use: No  ° ° °Home Medications °Prior to Admission medications   °Medication Sig Start Date End Date Taking? Authorizing Provider  °albuterol (VENTOLIN HFA) 108 (90 Base) MCG/ACT inhaler Inhale 2 puffs into the lungs every 4 (four) hours as needed for wheezing or shortness of breath. 06/07/21   Johnson, Deborah B, MD  °amLODipine (NORVASC) 10 MG   tablet Take 1 tablet (10 mg total) by mouth daily. Mush have office visit for refills 01/11/21   Johnson, Deborah B, MD  °atorvastatin (LIPITOR) 10 MG tablet Take 1 tablet (10 mg total) by mouth daily. 11/22/20   Johnson, Deborah B, MD  °azelastine (ASTELIN) 0.1 % nasal spray Spray 2 sprays twice a day by intranasal route. °Patient not taking: Reported on 06/21/2021 04/19/21     °azithromycin (ZITHROMAX) 250 MG tablet Take 2 tablets (500 mg total) by mouth daily for 5 days. Take first 2 tablets together, then 1 every day until finished. 06/27/21 07/02/21  Loeffler, Grace C, PA-C  °carvedilol (COREG) 25 MG tablet TAKE 1 TABLET (25 MG TOTAL) BY MOUTH 2 (TWO) TIMES DAILY WITH A MEAL. °Patient taking differently: Take 25 mg by mouth 2 (two)  times daily with a meal. 11/13/20 11/13/21  Johnson, Deborah B, MD  °fluticasone (FLONASE) 50 MCG/ACT nasal spray Place 2 sprays into both nostrils daily.    [provider]  °fluticasone (FLONASE) 50 MCG/ACT nasal spray Place 1 spray into both nostrils daily. 06/21/21 07/21/21  MacPherson, Jett, MD  °hydrALAZINE (APRESOLINE) 25 MG tablet Take 1 tablet (25 mg total) by mouth 3 (three) times daily. Start after Losartan/Hydrochlorothiazide combination pill is finished. 11/22/20   Johnson, Deborah B, MD  °ibuprofen (ADVIL) 600 MG tablet TAKE 1 TABLET EVERY 6 HOURS AS NEEDED. °Patient taking differently: Take 600 mg by mouth every 6 (six) hours as needed for headache or mild pain. 12/27/20     °loratadine (CLARITIN) 10 MG tablet Take 1 tablet (10 mg total) by mouth daily. °Patient taking differently: Take 10 mg by mouth daily as needed for allergies or rhinitis. 03/19/21   Mayers, Cari S, PA-C  °losartan (COZAAR) 100 MG tablet Take 1 tablet (100 mg total) by mouth daily. Start after you complete the bottle of Losartan/Hydrochlorothiazide 11/22/20   Johnson, Deborah B, MD  °nicotine (NICOTINE STEP 1) 21 mg/24hr patch Place 1 patch (21 mg total) onto the skin daily. °Patient not taking: Reported on 05/28/2021 11/13/20   Johnson, Deborah B, MD  °nicotine polacrilex (NICORETTE) 2 MG gum Take 1 each (2 mg total) by mouth as needed for smoking cessation. °Patient not taking: Reported on 05/28/2021 11/13/20   Johnson, Deborah B, MD  °pantoprazole (PROTONIX) 20 MG tablet Take 2 tablets (40 mg total) by mouth daily. 06/21/21 07/21/21  MacPherson, Jett, MD  °PEG-KCl-NaCl-NaSulf-Na Asc-C (PLENVU) 140 g SOLR Take 1 kit by mouth as directed. 06/07/21   Danis, Henry L III, MD  °predniSONE (DELTASONE) 10 MG tablet Take 4 tablets (40 mg total) by mouth daily for 5 days. 06/27/21 07/02/21  Loeffler, Grace C, PA-C  °promethazine-dextromethorphan (PROMETHAZINE-DM) 6.25-15 MG/5ML syrup Take 5 mLs by mouth 4 (four) times daily as needed for up to  7 days for cough. 06/27/21 07/04/21  Loeffler, Grace C, PA-C  ° ° °Allergies    °Hydrochlorothiazide ° °Review of Systems   °Review of Systems  °Constitutional:  Negative for chills and fever.  °HENT:  Positive for congestion. Negative for ear pain, rhinorrhea, sinus pressure and trouble swallowing.   °Respiratory:  Positive for cough and shortness of breath. Negative for wheezing.   °Cardiovascular:  Negative for chest pain.  °Gastrointestinal:  Negative for abdominal pain, nausea and vomiting.  °Neurological:  Negative for headaches.  °All other systems reviewed and are negative. ° °Physical Exam °Updated Vital Signs °BP (!) 185/126    Pulse 73    Temp 98.5 °F (36.9 °C) (  Oral)    Resp (!) 22    Ht 5' 6" (1.676 m)    Wt 40.8 kg    SpO2 98%    BMI 14.53 kg/m²  ° °Physical Exam °Vitals and nursing note reviewed.  °Constitutional:   °   General: She is not in acute distress. °   Appearance: Normal appearance. She is not ill-appearing, toxic-appearing or diaphoretic.  °HENT:  °   Head: Normocephalic and atraumatic.  °   Nose: Congestion present. No nasal deformity.  °   Mouth/Throat:  °   Lips: Pink. No lesions.  °   Mouth: Mucous membranes are moist. No injury, lacerations, oral lesions or angioedema.  °   Pharynx: Oropharynx is clear. Uvula midline. No pharyngeal swelling, oropharyngeal exudate, posterior oropharyngeal erythema or uvula swelling.  °Eyes:  °   General: Gaze aligned appropriately. No scleral icterus.    °   Right eye: No discharge.     °   Left eye: No discharge.  °   Conjunctiva/sclera: Conjunctivae normal.  °   Right eye: Right conjunctiva is not injected. No exudate or hemorrhage. °   Left eye: Left conjunctiva is not injected. No exudate or hemorrhage. °Neck:  °   Vascular: No JVD.  °   Trachea: No tracheal deviation.  °Cardiovascular:  °   Rate and Rhythm: Normal rate and regular rhythm.  °   Pulses: Normal pulses.     °     Radial pulses are 2+ on the right side and 2+ on the left side.  °      Dorsalis pedis pulses are 2+ on the right side and 2+ on the left side.  °   Heart sounds: Normal heart sounds, S1 normal and S2 normal. Heart sounds not distant. No murmur heard. °  No friction rub. No gallop. No S3 or S4 sounds.  °Pulmonary:  °   Effort: Pulmonary effort is normal. No tachypnea, accessory muscle usage or respiratory distress.  °   Breath sounds: No stridor. Rhonchi present. No decreased breath sounds, wheezing or rales.  °   Comments: Rhonchi bilaterally that improves with cough °Chest:  °   Chest wall: No tenderness.  °Abdominal:  °   General: Abdomen is flat. Bowel sounds are normal. There is no distension.  °   Palpations: Abdomen is soft. There is no mass or pulsatile mass.  °   Tenderness: There is no abdominal tenderness. There is no guarding or rebound.  °Musculoskeletal:  °   Cervical back: Normal range of motion and neck supple.  °   Right lower leg: No edema.  °   Left lower leg: No edema.  °Lymphadenopathy:  °   Cervical: No cervical adenopathy.  °Skin: °   General: Skin is warm and dry.  °   Coloration: Skin is not jaundiced or pale.  °   Findings: No bruising, erythema, lesion or rash.  °Neurological:  °   General: No focal deficit present.  °   Mental Status: She is alert and oriented to person, place, and time.  °   GCS: GCS eye subscore is 4. GCS verbal subscore is 5. GCS motor subscore is 6.  °Psychiatric:     °   Mood and Affect: Mood normal.     °   Behavior: Behavior normal. Behavior is cooperative.  ° ° °ED Results / Procedures / Treatments   °Labs °(all labs ordered are listed, but only abnormal results are   are displayed) Labs Reviewed  COMPREHENSIVE METABOLIC PANEL - Abnormal; Notable for the following components:      Result Value   Calcium 10.4 (*)    All other components within normal limits  BRAIN NATRIURETIC PEPTIDE - Abnormal; Notable for the following components:   B Natriuretic Peptide 160.7 (*)    All other components within normal limits  RESP PANEL BY RT-PCR  (FLU A&B, COVID) ARPGX2  CBC WITH DIFFERENTIAL/PLATELET  TROPONIN I (HIGH SENSITIVITY)  TROPONIN I (HIGH SENSITIVITY)    EKG EKG Interpretation  Date/Time:  Wednesday June 27 2021 14:07:35 EST Ventricular Rate:  72 PR Interval:  113 QRS Duration: 89 QT Interval:  384 QTC Calculation: 421 R Axis:   76 Text Interpretation: Sinus rhythm Borderline short PR interval LVH with secondary repolarization abnormality Confirmed by Nanda Quinton [phone]) on 06/27/2021 2:31:08 PM  Radiology DG Chest Portable 1 View  Result Date: 06/27/2021 CLINICAL DATA:  Shortness of breath over the last week EXAM: PORTABLE CHEST 1 VIEW COMPARISON:  01/14/2018 FINDINGS: The heart size and mediastinal contours are within normal limits. Both lungs are clear. The visualized skeletal structures are unremarkable. IMPRESSION: No active disease. Electronically Signed   By: Nelson Chimes M.D.   On: 06/27/2021 14:53    Procedures Procedures   Medications Ordered in ED Medications  albuterol (PROVENTIL) (2.5 MG/3ML) 0.083% nebulizer solution 3 mL (has no administration in time range)  albuterol (PROVENTIL) (2.5 MG/3ML) 0.083% nebulizer solution 5 mg (5 mg Nebulization Given 06/27/21 1451)  methylPREDNISolone sodium succinate (SOLU-MEDROL) 125 mg/2 mL injection 125 mg (125 mg Intravenous Given 06/27/21 1606)    ED Course  I have reviewed the triage vital signs and the nursing notes.  Pertinent labs & imaging results that were available during my care of the patient were reviewed by me and considered in my medical decision making (see chart for details).    MDM Rules/Calculators/A&P                          This is a 60 y.o. female with a PMH of hypertension, tobacco use disorder, hyperlipidemia who presents to the ED with 1 week of worsening upper respiratory symptoms including cough, shortness of breath, and nasal congestion.  Vitals: Oxygenating well on room air.  Patient is afebrile.  Vitals otherwise  hemodynamically stable.  Patient is well appearing and in no acute distress. Exam lung sounds with some bilateral rhonchi that clears with cough.  No wheezing on exam.  No leg swelling.  Pulses 2+ bilaterally in upper and lower extremities.  I personally reviewed all laboratory work and imaging. Abnormal results outlined below. - CBC without leukocytosis  - CMP with no acute abnormality - Troponin negative - BNP minimally elevated with no comparison - Respiratory panel negative - CXR with no PNA, signs of pulmonary htn   Interpretation: Workup unrevealing. I suspect that this is an acute bronchitis exasperation given smoking history.   Events/Interventions: Nebulizer here + Solumedrol  On reassessment, patient without labored breathing or wheezing. Feeling much better. Continues to oxygenate well on RA.  Dispo Plan: I feel patient will benefit from abx given the worsening course of her sx and comorbidity burden. Will also prescribe steroids and antitussives.  I have seen and evaluated this patient in conjunction with my attending physician who agrees and has made changes to the plan accordingly.  Portions of this note were generated with [employer]. Dictation errors may occur despite  attempts at proofreading. ° ° °Final Clinical Impression(s) / ED Diagnoses °Final diagnoses:  °Bronchitis  ° ° °Rx / DC Orders °ED Discharge Orders   ° °      Ordered  °  predniSONE (DELTASONE) 10 MG tablet  Daily,   Status:  Discontinued       ° 06/27/21 1648  °  azithromycin (ZITHROMAX) 250 MG tablet  Daily,   Status:  Discontinued       ° 06/27/21 1648  °  promethazine-dextromethorphan (PROMETHAZINE-DM) 6.25-15 MG/5ML syrup  4 times daily PRN,   Status:  Discontinued       ° 06/27/21 1648  °  azithromycin (ZITHROMAX) 250 MG tablet  Daily       ° 06/27/21 1651  °  predniSONE (DELTASONE) 10 MG tablet  Daily       ° 06/27/21 1651  °  promethazine-dextromethorphan (PROMETHAZINE-DM) 6.25-15  MG/5ML syrup  4 times daily PRN       ° 06/27/21 1651  ° °  °  ° °  ° °  °Loeffler, Grace C, PA-C °06/27/21 1724 ° °  °Zackowski, Scott, MD °06/28/21 1724 ° °

## 2021-06-27 NOTE — Telephone Encounter (Signed)
Reason for Disposition  [1] MODERATE difficulty breathing (e.g., speaks in phrases, SOB even at rest, pulse 100-120) AND [2] NEW-onset or WORSE than normal  Answer Assessment - Initial Assessment Questions 1. RESPIRATORY STATUS: "Describe your breathing?" (e.g., wheezing, shortness of breath, unable to speak, severe coughing)      I went to ED and they didn't do any tests on me they were so busy.   My lungs are clear and sent me home.   Told me to use my flonase.   My breathing is a mess.   No lung problems. 2. ONSET: "When did this breathing problem begin?"      Seen in ED 06/21/2021 for breathing difficulty.    3. PATTERN "Does the difficult breathing come and go, or has it been constant since it started?"      All the time and getting worse. 4. SEVERITY: "How bad is your breathing?" (e.g., mild, moderate, severe)    - MILD: No SOB at rest, mild SOB with walking, speaks normally in sentences, can lie down, no retractions, pulse < 100.    - MODERATE: SOB at rest, SOB with minimal exertion and prefers to sit, cannot lie down flat, speaks in phrases, mild retractions, audible wheezing, pulse 100-120.    - SEVERE: Very SOB at rest, speaks in single words, struggling to breathe, sitting hunched forward, retractions, pulse > 120      Mostly short of breath when up walking around.   I had a sinus infection that never cleared and went into my chest.    He told me I have acid reflux but I don't believe. 5. RECURRENT SYMPTOM: "Have you had difficulty breathing before?" If Yes, ask: "When was the last time?" and "What happened that time?"      Yes   I use inhalers and using my [employer].    When I used the [employer] I kept getting strings of stuff out.    6. CARDIAC HISTORY: "Do you have any history of heart disease?" (e.g., heart attack, angina, bypass surgery, angioplasty)      Not asked 7. LUNG HISTORY: "Do you have any history of lung disease?"  (e.g., pulmonary embolus, asthma, emphysema)     No 8.  CAUSE: "What do you think is causing the breathing problem?"      I'm not getting better. 9. OTHER SYMPTOMS: "Do you have any other symptoms? (e.g., dizziness, runny nose, cough, chest pain, fever)     Stopped up nose.   Sore throat too.   I feel like there is a lump in my throat for the last 2 months.   I was told it was acid reflux.    I went to [facility] urgent care and they told me to see my PCP.      10. O2 SATURATION MONITOR:  "Do you use an oxygen saturation monitor (pulse oximeter) at home?" If Yes, "What is your reading (oxygen level) today?" "What is your usual oxygen saturation reading?" (e.g., 95%)       Not asked 11. PREGNANCY: "Is there any chance you are pregnant?" "When was your last menstrual period?"       N/A 12. TRAVEL: "Have you traveled out of the country in the last month?" (e.g., travel history, exposures)       N/A  Protocols used: Breathing Difficulty-A-AH

## 2021-06-27 NOTE — ED Provider Notes (Signed)
Emergency Medicine Provider Triage Evaluation Note  Lori Bush , a 60 y.o. female  was evaluated in triage.  Pt complains of cough, congestion, shortness of breath.  Shortness of breath is worse with exertion.  She was worked up in the emergency department on 12/15 with globus sensation.  She states is actually been present for the past 2 months or so and was blamed on GERD.  She is been taking medications it seems better but has developed congestion and shortness of breath since that time.  She denies any chest pain.  She does smoke until the 15th when she quit.  She has tried her albuterol at home with only transient relief of symptoms.  Review of Systems  Positive: SOB, wheezing, and congestion.  Negative: Chest pain, abdominal pain, vomiting.   Physical Exam  BP (!) 194/117    Pulse 77    Temp 98.5 F (36.9 C) (Oral)    Resp (!) 22    Ht 5\' 6"  (1.676 m)    Wt 40.8 kg    SpO2 98%    BMI 14.53 kg/m  Gen:   Awake, no distress   Resp:  Normal effort but bilateral end-expiratory wheezing.  MSK:   Moves extremities without difficulty  Other:  Soft abdomen.   Medical Decision Making  Medically screening exam initiated at 2:39 PM.  Appropriate orders placed.  Ryeleigh Santore was informed that the remainder of the evaluation will be completed by another provider, this initial triage assessment does not replace that evaluation, and the importance of remaining in the ED until their evaluation is complete.  Have ordered x-ray along with screening blood work and nebulizer.   Margette Fast, MD 06/27/21 1451

## 2021-06-27 NOTE — ED Notes (Signed)
Discharge instructions discussed with pt. Pt verbalized understanding. Pt stable and ambulatory.  °

## 2021-06-27 NOTE — ED Triage Notes (Signed)
Pt complains of SOB since Wednesday after being discharged from the ED. Pt states SOB gets worse with exertion. Pt denis N/V. Pt denis pain.

## 2021-07-31 ENCOUNTER — Other Ambulatory Visit: Payer: Self-pay

## 2021-08-02 ENCOUNTER — Other Ambulatory Visit: Payer: Self-pay

## 2021-08-17 ENCOUNTER — Encounter: Payer: Self-pay | Admitting: Internal Medicine

## 2021-08-17 ENCOUNTER — Other Ambulatory Visit: Payer: Self-pay

## 2021-08-17 ENCOUNTER — Ambulatory Visit: Payer: Self-pay | Attending: Internal Medicine | Admitting: Internal Medicine

## 2021-08-17 VITALS — BP 157/90 | HR 70 | Resp 16 | Wt 97.6 lb

## 2021-08-17 DIAGNOSIS — R0981 Nasal congestion: Secondary | ICD-10-CM

## 2021-08-17 DIAGNOSIS — F17200 Nicotine dependence, unspecified, uncomplicated: Secondary | ICD-10-CM

## 2021-08-17 DIAGNOSIS — R0989 Other specified symptoms and signs involving the circulatory and respiratory systems: Secondary | ICD-10-CM

## 2021-08-17 DIAGNOSIS — I10 Essential (primary) hypertension: Secondary | ICD-10-CM

## 2021-08-17 MED ORDER — HYDRALAZINE HCL 50 MG PO TABS
50.0000 mg | ORAL_TABLET | Freq: Three times a day (TID) | ORAL | 6 refills | Status: DC
  Filled 2021-08-17 – 2021-09-10 (×2): qty 90, 30d supply, fill #0

## 2021-08-17 NOTE — Progress Notes (Signed)
Patient ID: Lori Bush, female    DOB: 07-18-1960  MRN: [id number]  CC: Hypertension and Sore Throat   Subjective: Lori Bush is a 61 y.o. female who presents for chronic ds management Her concerns today include:  Hx of HTN, hep C cured with Harvoni, tobacco dep, wgh loss.   Globus sensation: On last visit with me, patient complained of feeling like something was in her throat that was affecting her swallowing.  Referred to GI.  Had EGD that was normal but they did dilation of the esophagus during the procedure.  No improvement in symptoms.  Seen in the emergency room for the same on 06/21/2021.  Prescribed pantoprazole which she took for about a month again with no improvement in symptoms.   HTN: She has not checked her blood pressure in the past 2 weeks.  Reports compliance with taking medications and limiting salt in the foods.  Confirms compliance with amlodipine, Cozaar, hydralazine.   Tob dep: she has cut back to 1/2 pk in 3 days.  Trying to quit.  Has patch and gum but not use as yet  Complains of sinus congestion.  She uses a Medical sales representative.   Patient Active Problem List   Diagnosis Date Noted   Elevated blood pressure reading with diagnosis of hypertension 03/19/2021   Aortic atherosclerosis ([place]) 11/27/2020   Renal mass 42/35/3614   Helicobacter pylori (H. pylori) infection 02/27/2018   Lung nodule, multiple 02/27/2018   Tobacco dependence 03/17/2017   Cyst of skin 03/17/2017   Primary osteoarthritis of right knee 10/28/2016   Chronic hepatitis C without hepatic coma ([place]) 03/26/2016   Chronic right shoulder pain 01/17/2015   Hypertension 03/05/2013     Current Outpatient Medications on File Prior to Visit  Medication Sig Dispense Refill   albuterol (VENTOLIN HFA) 108 (90 Base) MCG/ACT inhaler Inhale 2 puffs into the lungs every 4 (four) hours as needed for wheezing or shortness of breath. 18 g 2   amLODipine (NORVASC) 10 MG tablet Take 1  tablet (10 mg total) by mouth daily. Mush have office visit for refills 30 tablet 6   atorvastatin (LIPITOR) 10 MG tablet Take 1 tablet (10 mg total) by mouth daily. 90 tablet 3   azelastine (ASTELIN) 0.1 % nasal spray Spray 2 sprays twice a day by intranasal route. (Patient not taking: Reported on 06/21/2021) 30 mL 4   carvedilol (COREG) 25 MG tablet TAKE 1 TABLET (25 MG TOTAL) BY MOUTH 2 (TWO) TIMES DAILY WITH A MEAL. (Patient taking differently: Take 25 mg by mouth 2 (two) times daily with a meal.) 180 tablet 3   fluticasone (FLONASE) 50 MCG/ACT nasal spray Place 2 sprays into both nostrils daily.     fluticasone (FLONASE) 50 MCG/ACT nasal spray Place 1 spray into both nostrils daily. 16 g 0   ibuprofen (ADVIL) 600 MG tablet TAKE 1 TABLET EVERY 6 HOURS AS NEEDED. (Patient taking differently: Take 600 mg by mouth every 6 (six) hours as needed for headache or mild pain.) 30 tablet 0   loratadine (CLARITIN) 10 MG tablet Take 1 tablet (10 mg total) by mouth daily. (Patient taking differently: Take 10 mg by mouth daily as needed for allergies or rhinitis.) 30 tablet 11   losartan (COZAAR) 100 MG tablet Take 1 tablet (100 mg total) by mouth daily. Start after you complete the bottle of Losartan/Hydrochlorothiazide 30 tablet 5   nicotine (NICOTINE STEP 1) 21 mg/24hr patch Place 1 patch (21 mg total) onto  the skin daily. (Patient not taking: Reported on 05/28/2021) 28 patch 0   nicotine polacrilex (NICORETTE) 2 MG gum Take 1 each (2 mg total) by mouth as needed for smoking cessation. (Patient not taking: Reported on 05/28/2021) 100 tablet 0   pantoprazole (PROTONIX) 20 MG tablet Take 2 tablets (40 mg total) by mouth daily. 60 tablet 0   PEG-KCl-NaCl-NaSulf-Na Asc-C (PLENVU) 140 g SOLR Take 1 kit by mouth as directed. 3 each 0   No current facility-administered medications on file prior to visit.    Allergies  Allergen Reactions   Hydrochlorothiazide     Causes low Potassium and high calcium.     Social History   Socioeconomic History   Marital status: Single    Spouse name: Not on file   Number of children: 5   Years of education: Not on file   Highest education level: Not on file  Occupational History   Occupation: Publishing copy  Tobacco Use   Smoking status: Former    Packs/day: 0.50    Types: Cigarettes   Smokeless tobacco: Never   Tobacco comments:    states she cut back 3 cigarettes/day  Vaping Use   Vaping Use: Never used  Substance and Sexual Activity   Alcohol use: No   Drug use: No   Sexual activity: Not Currently  Other Topics Concern   Not on file  Social History Narrative   Not on file   Social Determinants of Health   Financial Resource Strain: Not on file  Food Insecurity: Not on file  Transportation Needs: Not on file  Physical Activity: Not on file  Stress: Not on file  Social Connections: Not on file  Intimate Partner Violence: Not on file    Family History  Problem Relation Age of Onset   Hypertension Mother    Heart disease Mother    Hypertension Father    Dementia Father    Arthritis Sister    Hypertension Sister    Arthritis Brother    Hypertension Brother    Colon cancer Neg Hx    Stomach cancer Neg Hx    Rectal cancer Neg Hx    Esophageal cancer Neg Hx    Colon polyps Neg Hx     Past Surgical History:  Procedure Laterality Date   CESAREAN SECTION     COLONOSCOPY     COLONOSCOPY WITH ESOPHAGOGASTRODUODENOSCOPY (EGD)  03/2007   HERNIA REPAIR  1971   as a child, umbilical   WISDOM TOOTH EXTRACTION      ROS: Review of Systems Negative except as stated above  PHYSICAL EXAM: BP (!) 157/90    Pulse 70    Resp 16    Wt 97 lb 9.6 oz (44.3 kg)    SpO2 96%    BMI 15.75 kg/m   Wt Readings from Last 3 Encounters:  08/17/21 97 lb 9.6 oz (44.3 kg)  06/27/21 90 lb (40.8 kg)  06/21/21 98 lb 1.7 oz (44.5 kg)  169/91  Physical Exam  General appearance - alert, well appearing, and in no distress Mental status -  normal mood, behavior, speech, dress, motor activity, and thought processes Nose -mild enlargement of nasal turbinates.  Moist mucosa.   Neck - supple, no significant adenopathy Chest - clear to auscultation, no wheezes, rales or rhonchi, symmetric air entry Heart - normal rate, regular rhythm, normal S1, S2, no murmurs, rubs, clicks or gallops Extremities - peripheral pulses normal, no pedal edema, no clubbing or cyanosis  CMP Latest Ref Rng & Units 06/27/2021 11/20/2020 04/06/2020  Glucose 70 - 99 mg/dL 88 92 82  BUN 6 - 20 mg/dL _0 Creatinine 0.44 - 1.00 mg/dL 0.83 0.85 0.77  Sodium 135 - 145 mmol/L 142 139 138  Potassium 3.5 - 5.1 mmol/L 3.5 3.4(L) 3.7  Chloride 98 - 111 mmol/L 106 102 101  CO2 22 - 32 mmol/L _1 Calcium 8.9 - 10.3 mg/dL 10.4(H) 10.7(H) 10.4(H)  Total Protein 6.5 - 8.1 g/dL 7.1 6.9 -  Total Bilirubin 0.3 - 1.2 mg/dL 0.6 0.3 -  Alkaline Phos 38 - 126 U/L 63 82 -  AST 15 - 41 U/L 18 12 -  ALT 0 - 44 U/L 12 9 -   Lipid Panel     Component Value Date/Time   CHOL 191 11/20/2020 1018   TRIG 58 11/20/2020 1018   HDL 66 11/20/2020 1018   CHOLHDL 2.9 11/20/2020 1018   CHOLHDL 3.0 09/30/2013 1509   VLDL 25 09/30/2013 1509   LDLCALC 114 (H) 11/20/2020 1018    CBC    Component Value Date/Time   WBC 7.2 06/27/2021 1446   RBC 4.70 06/27/2021 1446   HGB 13.7 06/27/2021 1446   HGB 14.4 11/20/2020 1018   HCT 40.6 06/27/2021 1446   HCT 42.2 11/20/2020 1018   PLT 235 06/27/2021 1446   PLT 257 11/20/2020 1018   MCV 86.4 06/27/2021 1446   MCV 86 11/20/2020 1018   MCH 29.1 06/27/2021 1446   MCHC 33.7 06/27/2021 1446   RDW 13.2 06/27/2021 1446   RDW 12.5 11/20/2020 1018   LYMPHSABS 2.4 06/27/2021 1446   MONOABS 0.6 06/27/2021 1446   EOSABS 0.4 06/27/2021 1446   BASOSABS 0.1 06/27/2021 1446    ASSESSMENT AND PLAN: 1. Globus sensation Symptoms have persisted.  EGD negative.  PPI has not helped.  I will send a message to the gastroenterologist  inquiring about the need for esophageal manometry to rule out motility disorder.  2. Essential hypertension Not at goal.  Increase hydralazine to 50 mg 3 times a day. - hydrALAZINE (APRESOLINE) 50 MG tablet; Take 1 tablet (50 mg total) by mouth 3 (three) times daily. Start after Losartan/Hydrochlorothiazide combination pill is finished.  Dispense: 90 tablet; Refill: 6  3. Tobacco dependence Commended her on cutting back.  Advised to quit which she states she is trying to do.  Encouraged her to use the nicotine replacement therapy which she has at home in the form of the patches and the gum.  4. Sinus congestion Continue Flonase nasal spray.  Advised to take her Claritin also.   Patient was given the opportunity to ask questions.  Patient verbalized understanding of the plan and was able to repeat key elements of the plan.   No orders of the defined types were placed in this encounter.    Requested Prescriptions   Signed Prescriptions Disp Refills   hydrALAZINE (APRESOLINE) 50 MG tablet 90 tablet 6    Sig: Take 1 tablet (50 mg total) by mouth 3 (three) times daily. Start after Losartan/Hydrochlorothiazide combination pill is finished.    Return in about 4 months (around 12/15/2021) for Appt with [facility] in 3 wks for BP check.  Karle Plumber, MD, FACP

## 2021-08-20 ENCOUNTER — Telehealth: Payer: Self-pay | Admitting: Internal Medicine

## 2021-08-20 DIAGNOSIS — R0989 Other specified symptoms and signs involving the circulatory and respiratory systems: Secondary | ICD-10-CM

## 2021-08-20 NOTE — Telephone Encounter (Signed)
PC placed to pt this evening. I left a message on her VM informing her that I heard back from Dr. Loletha Carrow.  His office will reach out to her to schedule the manometry.  However, he is also recommending that she be referred to ENT for laryngoscope.  I will submit the referral.

## 2021-08-20 NOTE — Telephone Encounter (Signed)
-----   Message from Doran Stabler, MD sent at 08/20/2021 10:20 AM EST ----- Thanks for the note.  As is often the case during an upper endoscopy, her larynx was not well visualized. I suspect the symptoms are laryngeal in a longtime smoker rather than from reflux.  We will make arrangements for follow-up and discuss esophageal manometry, though I will tell you that manometry studies are currently booking out over 3 months due to staffing issues.  Please refer her to ENT so they can do a direct laryngoscopy in the office and make sure she does not have neoplasia.  Wilfrid Lund  ----- Message ----- From: Ladell Pier, MD Sent: 08/17/2021   6:36 PM EST To: Doran Stabler, MD  I am the PCP for this patient.  Thank you for seeing her.  She continues to complain of globus sensation in the throat and feeling like food gets stuck there.  She feels the need to regurgitate when this happens.  Her EGD was negative.  I told her I will touch base with you to inquire about her having an esophageal manometry to rule out motility disorder.

## 2021-08-22 ENCOUNTER — Ambulatory Visit: Payer: Self-pay | Admitting: Internal Medicine

## 2021-08-22 NOTE — Telephone Encounter (Signed)
Noted  

## 2021-08-22 NOTE — Telephone Encounter (Signed)
°  Chief Complaint: irregular heart rate and palpitations  Symptoms: heart racing, irregular beat, short of breath, dizziness and feeling weak. Frequency: started this am  Pertinent Negatives: Patient denies denies chest pain, no difficulty breathing, denies not drinking fluids  Disposition: [x] ED /[] Urgent Care (no appt availability in office) / [] Appointment(In office/virtual)/ []  [facility]/ [] Home Care/ [] Refused Recommended Disposition /[] [place] [facility]/ []  Follow-up with PCP Additional Notes:   Recommended 911.   Reason for Disposition  [1] Dizziness, lightheadedness, or weakness AND [2] heart beating very rapidly (e.g., > 140 / minute)  Answer Assessment - Initial Assessment Questions 1. DESCRIPTION: "Please describe your heart rate or heartbeat that you are having" (e.g., fast/slow, regular/irregular, skipped or extra beats, "palpitations")     Irregular rate elevated  2. ONSET: "When did it start?" (Minutes, hours or days)      This am  3. DURATION: "How long does it last" (e.g., seconds, minutes, hours)     X 20 minutes  4. PATTERN "Does it come and go, or has it been constant since it started?"  "Does it get worse with exertion?"   "Are you feeling it now?"     Constant  5. TAP: "Using your hand, can you tap out what you are feeling on a chair or table in front of you, so that I can hear?" (Note: not all patients can do this)       na 6. HEART RATE: "Can you tell me your heart rate?" "How many beats in 15 seconds?"  (Note: not all patients can do this)       Unknown  7. RECURRENT SYMPTOM: "Have you ever had this before?" If Yes, ask: "When was the last time?" and "What happened that time?"      No  8. CAUSE: "What do you think is causing the palpitations?"     Not sure  9. CARDIAC HISTORY: "Do you have any history of heart disease?" (e.g., heart attack, angina, bypass surgery, angioplasty, arrhythmia)      No  10. OTHER SYMPTOMS: "Do you have any other  symptoms?" (e.g., dizziness, chest pain, sweating, difficulty breathing)       Dizziness , short of breath  11. PREGNANCY: "Is there any chance you are pregnant?" "When was your last menstrual period?"       na  Protocols used: Heart Rate and Heartbeat Questions-A-AH

## 2021-08-22 NOTE — Telephone Encounter (Signed)
I have reviewed and agreed with the RN assessment.

## 2021-08-23 ENCOUNTER — Ambulatory Visit: Payer: Self-pay | Admitting: Physician Assistant

## 2021-08-23 ENCOUNTER — Other Ambulatory Visit: Payer: Self-pay

## 2021-09-10 ENCOUNTER — Other Ambulatory Visit: Payer: Self-pay

## 2021-09-11 ENCOUNTER — Telehealth: Payer: Self-pay | Admitting: Internal Medicine

## 2021-09-11 ENCOUNTER — Other Ambulatory Visit: Payer: Self-pay

## 2021-09-11 DIAGNOSIS — I10 Essential (primary) hypertension: Secondary | ICD-10-CM

## 2021-09-11 NOTE — Telephone Encounter (Signed)
Pt came in stating she is confused about how and what to take for her hypertension, requesting a call to clarify  ?

## 2021-09-12 ENCOUNTER — Other Ambulatory Visit: Payer: Self-pay

## 2021-09-12 MED ORDER — HYDRALAZINE HCL 50 MG PO TABS
50.0000 mg | ORAL_TABLET | Freq: Three times a day (TID) | ORAL | 6 refills | Status: DC
  Filled 2021-09-12: qty 90, 30d supply, fill #0

## 2021-09-12 NOTE — Telephone Encounter (Signed)
Phone call placed to patient today to discuss her blood pressure medications. ?There was some confusion with the hydralazine prescription having instructions of 50 mg 3 times a day to be started after she finishes the Cozaar/hydrochlorothiazide combination pill.  We had stopped Cozaar-HCTZ last year due to elevated calcium level.  However we left her on the Cozaar by itself. ?Went over with patient list of blood pressure medicines that she should be on: ?Cozaar 100 mg daily ?Carvedilol 25 mg twice a day ?Amlodipine 10 mg daily ?Hydralazine 50 mg 3 times a day. ?Patient confirms that she is taking all 4 of these medicines. ? ?Also mention to patient that I had touch base with her gastroenterologist and he had recommended referral to ENT and I wanted to make sure that she had received my message.  Patient states that she did but did not have insurance at the time.  She will have [employer] as of April 1.  She request that appointment with ENT be made after that date.  I told her I will send this message to our referral coordinator. ?

## 2021-09-12 NOTE — Telephone Encounter (Signed)
Will forward to provider for clarification before calling pt  ?

## 2021-09-18 ENCOUNTER — Other Ambulatory Visit: Payer: Self-pay

## 2021-09-18 ENCOUNTER — Ambulatory Visit: Payer: Self-pay | Admitting: Pharmacist

## 2021-09-21 ENCOUNTER — Other Ambulatory Visit: Payer: Self-pay

## 2021-10-01 ENCOUNTER — Ambulatory Visit: Payer: Self-pay | Admitting: Internal Medicine

## 2021-10-11 ENCOUNTER — Telehealth: Payer: Self-pay | Admitting: Internal Medicine

## 2021-10-11 DIAGNOSIS — R0989 Other specified symptoms and signs involving the circulatory and respiratory systems: Secondary | ICD-10-CM

## 2021-10-11 NOTE — Telephone Encounter (Signed)
Copied from [place] [phone]. Topic: Referral - Request for Referral ?>> Oct 11, 2021 10:08 AM Tessa Lerner A wrote: ?Has patient seen PCP for this complaint? Yes.   ?*If NO, is insurance requiring patient see PCP for this issue before PCP can refer them? ?Referral for which specialty: Otolaryngology  ?Preferred provider/office: Patient has no preference but would like for them to accept [employer]  ?Reason for referral: globus sensation ?

## 2021-10-15 NOTE — Telephone Encounter (Signed)
Will forward to provider to place new referral  ?

## 2021-11-06 ENCOUNTER — Other Ambulatory Visit: Payer: Self-pay

## 2021-11-13 ENCOUNTER — Other Ambulatory Visit: Payer: Self-pay

## 2021-11-20 ENCOUNTER — Other Ambulatory Visit: Payer: Self-pay | Admitting: Pharmacist

## 2021-11-20 ENCOUNTER — Other Ambulatory Visit: Payer: Self-pay

## 2021-11-20 NOTE — Chronic Care Management (AMB) (Signed)
Patient seen by Barnet Pall, PharmD Candidate on 5/16 while they were picking up prescriptions at [place] at [facility]  ? ?Blood pressure with pharmacy cuff was 177/120. Denies any symptoms of headaches, chest pain.  ? ?Patient has an automated home blood pressure machine. They report home readings 150-160/80 ? ?Medication review was performed. They are not taking medications as prescribed- she had run out of her medications prior to coming in person to the pharmacy today to pick up refills.  ? ?The following barriers to adherence were noted: ?- Forgetting to take medications- reports she sometimes forgets her third hydralazine dose.  ? ?The following interventions were completed:  ?- Medications were reviewed ?- Patient was educated on medications, including indication and administration ?- Patient was educated on use of adherence strategies, like a pill box or alarms ?- Patient was educated on proper technique to check home blood pressure and reminded to bring home machine and readings to next provider appointment ?- Patient was counseled on lifestyle modifications to improve blood pressure ?- Patient was counseled on benefit of tobacco cessation- patient reports she is using nicotine patches and gum to work on quitting smoking ? ?The patient has follow up scheduled:  ?PCP: 12/20/21 ? ?Catie Hedwig Morton, PharmD, BCACP ?[place] ?[phone] ? ?

## 2021-11-21 ENCOUNTER — Telehealth: Payer: Self-pay | Admitting: Student-PharmD

## 2021-11-21 NOTE — Telephone Encounter (Signed)
Patient appearing on report for [employer] - Hypertension Control report due to last documented ambulatory blood pressure of 157/90 on 08/17/21 at PCP visit. BP checked by pharmacy student at [place] [facility] was 177/120. Reported home readings of 150-160/80. Patient was in the pharmacy to pick up medication refills as she had run out of BP medications. Next appointment with PCP is 12/20/21.  ? ?Outreached patient to discuss hypertension control and medication management. She reports she was very pleased with the care Leanna Sato, PharmD Student, provided her yesterday while checking her BP. She reports she was able to pick up her medications yesterday. Confirmed she is taking medications as prescribed and that she has all four BP medications on hand now.  ? ?Current antihypertensives: amlodipine 10 mg daily, losartan 100 mg daily, hydralazine 50 mg TID, carvedilol 25 mg BID ? ?Assessment/Plan: ?- Currently uncontrolled ?- Reviewed goal blood pressure <130/80 ?- Reviewed appropriate administration of medication regimen ?- Recommend she take her medications prior to next PCP visit. Reminded her that this visit is scheduled 12/20/21.  ? ?Rebbeca Paul, PharmD ?PGY2 Ambulatory Care Pharmacy Resident ?11/21/2021 1:04 PM ?

## 2021-11-22 DIAGNOSIS — K219 Gastro-esophageal reflux disease without esophagitis: Secondary | ICD-10-CM | POA: Diagnosis not present

## 2021-11-24 DIAGNOSIS — R1110 Vomiting, unspecified: Secondary | ICD-10-CM | POA: Diagnosis not present

## 2021-11-24 DIAGNOSIS — R079 Chest pain, unspecified: Secondary | ICD-10-CM | POA: Diagnosis not present

## 2021-11-24 DIAGNOSIS — M7521 Bicipital tendinitis, right shoulder: Secondary | ICD-10-CM | POA: Diagnosis not present

## 2021-11-24 DIAGNOSIS — R0789 Other chest pain: Secondary | ICD-10-CM | POA: Diagnosis not present

## 2021-11-24 DIAGNOSIS — R0602 Shortness of breath: Secondary | ICD-10-CM | POA: Diagnosis not present

## 2021-11-26 DIAGNOSIS — R9431 Abnormal electrocardiogram [ECG] [EKG]: Secondary | ICD-10-CM | POA: Diagnosis not present

## 2021-11-29 ENCOUNTER — Ambulatory Visit: Payer: 59 | Attending: Internal Medicine | Admitting: Internal Medicine

## 2021-11-29 ENCOUNTER — Other Ambulatory Visit: Payer: Self-pay

## 2021-11-29 VITALS — BP 158/89 | HR 84 | Ht 66.0 in | Wt 95.8 lb

## 2021-11-29 DIAGNOSIS — M25511 Pain in right shoulder: Secondary | ICD-10-CM | POA: Diagnosis not present

## 2021-11-29 DIAGNOSIS — R0989 Other specified symptoms and signs involving the circulatory and respiratory systems: Secondary | ICD-10-CM | POA: Diagnosis not present

## 2021-11-29 DIAGNOSIS — R636 Underweight: Secondary | ICD-10-CM | POA: Diagnosis not present

## 2021-11-29 DIAGNOSIS — I10 Essential (primary) hypertension: Secondary | ICD-10-CM | POA: Diagnosis not present

## 2021-11-29 DIAGNOSIS — R233 Spontaneous ecchymoses: Secondary | ICD-10-CM | POA: Diagnosis not present

## 2021-11-29 DIAGNOSIS — F17200 Nicotine dependence, unspecified, uncomplicated: Secondary | ICD-10-CM

## 2021-11-29 DIAGNOSIS — R079 Chest pain, unspecified: Secondary | ICD-10-CM | POA: Diagnosis not present

## 2021-11-29 DIAGNOSIS — I700 Atherosclerosis of aorta: Secondary | ICD-10-CM | POA: Diagnosis not present

## 2021-11-29 DIAGNOSIS — I809 Phlebitis and thrombophlebitis of unspecified site: Secondary | ICD-10-CM

## 2021-11-29 DIAGNOSIS — R69 Illness, unspecified: Secondary | ICD-10-CM | POA: Diagnosis not present

## 2021-11-29 MED ORDER — HYDRALAZINE HCL 50 MG PO TABS
75.0000 mg | ORAL_TABLET | Freq: Three times a day (TID) | ORAL | 6 refills | Status: DC
  Filled 2021-11-29: qty 135, 30d supply, fill #0
  Filled 2022-03-18: qty 135, 30d supply, fill #1
  Filled 2022-09-05 (×2): qty 135, 30d supply, fill #2

## 2021-11-29 NOTE — Progress Notes (Signed)
Needs referrals

## 2021-11-29 NOTE — Patient Instructions (Signed)
Increase hydralazine to 1-1/2 tablets 3 times a day.  Continue to monitor blood pressure and record your readings.  We will have you follow-up with the clinical pharmacist in 3 to 4 weeks.

## 2021-11-29 NOTE — Progress Notes (Signed)
Patient ID: Lori Bush, female    DOB: [birth year]  MRN: [id number]  CC: Hypertension   Subjective: Lori Bush is a 61 y.o. female who presents for chronic ds management and f/u ER visit. Her concerns today include:  Hx of HTN, hep C cured with Harvoni, tobacco dep, wgh loss  Globus Sensation:  saw ENT. Had laryngoscope Told symptoms due to GERD and to take OTC Prilosec.  She purchased the Prilosec.  It seems to be helping, improved 75%.  Also told foods to avoid that can make GERD symptoms worse..  Never heard back from Dr. Loletha Carrow for scheduling of manometry to rule out esophageal dysmotility.  Would like referral back to him.  HTN: taking meds as prescribed and limiting salt.  Medications include amlodipine 10 mg daily, carvedilol 25 mg twice a day, hydralazine 50 mg 3 times a day. Checking BP 2x/wk.  Range 150-160/80-89  HL:  taking and tolerating atorvastatin  Tob dep:  previously at 1/2 pk Q 3 days.  Now down to 1-2 cigarettes/day.  Plans to quit.  Declines nicotine replacement therapy or other medications to help her quit.   C/o bruising that appeared on both thighs anteriorly about 2 weeks ago.  No initiating factors.  Denies any falls or trauma.  She has varicose veins on the lower legs including the thighs.  She is noted a little soreness over one of the varicose veins in the posterior lateral left leg below the popliteal area.  Seen in the emergency room in [facility] 11/24/2021 with complaints of right shoulder pain and right-sided chest pain x2 days.  Pain was worse with movement of the right shoulder.  This was associated with some nausea and shortness of breath.  Troponin levels were normal.  CBC normal.  Chest x-ray revealed no acute findings but did show some aortic atherosclerosis with some pulmonary hyperinflation.  EKG revealed no acute changes.  Reports being told to use ibuprofen as needed and was shown some shoulder exercises.  Patient reports that the  pain in the shoulder and chest is better but has not gone away completely.  She would like referral to cardiology and orthopedics.  She is concerned that she is not able to gain weight despite having a very good appetite.  She wonders whether any supplements would help and request referral to nutritionist.  She is underweight for height which is not new.  Recent albumin level was 4.2 which is in the normal range.  Patient Active Problem List   Diagnosis Date Noted   Elevated blood pressure reading with diagnosis of hypertension 03/19/2021   Aortic atherosclerosis ([place]) 11/27/2020   Renal mass 40/02/6760   Helicobacter pylori (H. pylori) infection 02/27/2018   Lung nodule, multiple 02/27/2018   Tobacco dependence 03/17/2017   Cyst of skin 03/17/2017   Primary osteoarthritis of right knee 10/28/2016   Chronic hepatitis C without hepatic coma ([place]) 03/26/2016   Chronic right shoulder pain 01/17/2015   Hypertension 03/05/2013     Current Outpatient Medications on File Prior to Visit  Medication Sig Dispense Refill   albuterol (VENTOLIN HFA) 108 (90 Base) MCG/ACT inhaler Inhale 2 puffs into the lungs every 4 (four) hours as needed for wheezing or shortness of breath. 18 g 2   amLODipine (NORVASC) 10 MG tablet Take 1 tablet (10 mg total) by mouth daily. Mush have office visit for refills 30 tablet 6   atorvastatin (LIPITOR) 10 MG tablet Take 1 tablet (10 mg total) by mouth  daily. 90 tablet 3   fluticasone (FLONASE) 50 MCG/ACT nasal spray Place 2 sprays into both nostrils daily.     ibuprofen (ADVIL) 600 MG tablet TAKE 1 TABLET EVERY 6 HOURS AS NEEDED. (Patient taking differently: Take 600 mg by mouth every 6 (six) hours as needed for headache or mild pain.) 30 tablet 0   losartan (COZAAR) 100 MG tablet Take 1 tablet (100 mg total) by mouth daily. Start after you complete the bottle of Losartan/Hydrochlorothiazide 30 tablet 5   carvedilol (COREG) 25 MG tablet TAKE 1 TABLET (25 MG TOTAL) BY MOUTH  2 (TWO) TIMES DAILY WITH A MEAL. (Patient taking differently: Take 25 mg by mouth 2 (two) times daily with a meal.) 180 tablet 3   fluticasone (FLONASE) 50 MCG/ACT nasal spray Place 1 spray into both nostrils daily. 16 g 0   PEG-KCl-NaCl-NaSulf-Na Asc-C (PLENVU) 140 g SOLR Take 1 kit by mouth as directed. (Patient not taking: Reported on 11/29/2021) 3 each 0   No current facility-administered medications on file prior to visit.    Allergies  Allergen Reactions   Hydrochlorothiazide     Causes low Potassium and high calcium.    Social History   Socioeconomic History   Marital status: Single    Spouse name: Not on file   Number of children: 5   Years of education: Not on file   Highest education level: Not on file  Occupational History   Occupation: Publishing copy  Tobacco Use   Smoking status: Former    Packs/day: 0.50    Types: Cigarettes   Smokeless tobacco: Never   Tobacco comments:    states she cut back 3 cigarettes/day  Vaping Use   Vaping Use: Never used  Substance and Sexual Activity   Alcohol use: No   Drug use: No   Sexual activity: Not Currently  Other Topics Concern   Not on file  Social History Narrative   Not on file   Social Determinants of Health   Financial Resource Strain: Not on file  Food Insecurity: Not on file  Transportation Needs: Not on file  Physical Activity: Not on file  Stress: Not on file  Social Connections: Not on file  Intimate Partner Violence: Not on file    Family History  Problem Relation Age of Onset   Hypertension Mother    Heart disease Mother    Hypertension Father    Dementia Father    Arthritis Sister    Hypertension Sister    Arthritis Brother    Hypertension Brother    Colon cancer Neg Hx    Stomach cancer Neg Hx    Rectal cancer Neg Hx    Esophageal cancer Neg Hx    Colon polyps Neg Hx     Past Surgical History:  Procedure Laterality Date   CESAREAN SECTION     COLONOSCOPY     COLONOSCOPY WITH  ESOPHAGOGASTRODUODENOSCOPY (EGD)  03/2007   HERNIA REPAIR  1971   as a child, umbilical   WISDOM TOOTH EXTRACTION      ROS: Review of Systems Negative except as stated above  PHYSICAL EXAM: BP (!) 158/89   Pulse 84   Ht 5' 6"  (1.676 m)   Wt 95 lb 12.8 oz (43.5 kg)   SpO2 99%   BMI 15.46 kg/m   Wt Readings from Last 3 Encounters:  11/29/21 95 lb 12.8 oz (43.5 kg)  08/17/21 97 lb 9.6 oz (44.3 kg)  06/27/21 90 lb (40.8 kg)  Repeat  BP 160/85  Physical Exam  General appearance - alert, well appearing, and in no distress Mental status - normal mood, behavior, speech, dress, motor activity, and thought processes Neck - supple, no significant adenopathy Chest - clear to auscultation, no wheezes, rales or rhonchi, symmetric air entry Heart - normal rate, regular rhythm, normal S1, S2, no murmurs, rubs, clicks or gallops Musculoskeletal -right shoulder: Mild tenderness along the anterior joint line.  She has mild discomfort with passive range of motion in all directions but especially with elevation. Extremities -no lower extremity edema.  Noted to have moderate spider veins on the anterior thighs and varicose veins in both lower legs below the knee. Skin - Noted to have moderate spider veins on the anterior thighs and varicose veins in both lower legs below the knee.  She has about a 3 to 4 cm area of resolving ecchymosis noted in the anterior lower thighs.  She has mild tenderness along the short segment of serpiginous varicose vein on the posterior lateral right leg just below the popliteal area      Latest Ref Rng & Units 06/27/2021    2:46 PM 11/20/2020   10:18 AM 04/06/2020   12:15 PM  CMP  Glucose 70 - 99 mg/dL 88   92   82    BUN 6 - 20 mg/dL 13   16   15     Creatinine 0.44 - 1.00 mg/dL 0.83   0.85   0.77    Sodium 135 - 145 mmol/L 142   139   138    Potassium 3.5 - 5.1 mmol/L 3.5   3.4   3.7    Chloride 98 - 111 mmol/L 106   102   101    CO2 22 - 32 mmol/L 25   22   25      Calcium 8.9 - 10.3 mg/dL 10.4   10.7   10.4    Total Protein 6.5 - 8.1 g/dL 7.1   6.9     Total Bilirubin 0.3 - 1.2 mg/dL 0.6   0.3     Alkaline Phos 38 - 126 U/L 63   82     AST 15 - 41 U/L 18   12     ALT 0 - 44 U/L 12   9      Lipid Panel     Component Value Date/Time   CHOL 191 11/20/2020 1018   TRIG 58 11/20/2020 1018   HDL 66 11/20/2020 1018   CHOLHDL 2.9 11/20/2020 1018   CHOLHDL 3.0 09/30/2013 1509   VLDL 25 09/30/2013 1509   LDLCALC 114 (H) 11/20/2020 1018    CBC    Component Value Date/Time   WBC 7.8 11/29/2021 1553   WBC 7.2 06/27/2021 1446   RBC 4.68 11/29/2021 1553   RBC 4.70 06/27/2021 1446   HGB 13.8 11/29/2021 1553   HCT 40.1 11/29/2021 1553   PLT 227 11/29/2021 1553   MCV 86 11/29/2021 1553   MCH 29.5 11/29/2021 1553   MCH 29.1 06/27/2021 1446   MCHC 34.4 11/29/2021 1553   MCHC 33.7 06/27/2021 1446   RDW 12.7 11/29/2021 1553   LYMPHSABS 2.4 06/27/2021 1446   MONOABS 0.6 06/27/2021 1446   EOSABS 0.4 06/27/2021 1446   BASOSABS 0.1 06/27/2021 1446    ASSESSMENT AND PLAN:  1. Essential hypertension Not at goal.  Continue carvedilol and amlodipine.  Increase hydralazine to 75 mg 3 times a day. - hydrALAZINE (APRESOLINE) 50 MG tablet; Take 1.5  tablets (75 mg total) by mouth 3 (three) times daily.  Dispense: 135 tablet; Refill: 6  2. Tobacco dependence Commended her on trying to quit.  She declines medication to further assist with this.  Encouraged her to set a quit date.  3. Globus sensation Improved with omeprazole but still present.  She would like to follow-up with Dr. Loletha Carrow to see about having manometry - Ambulatory referral to Gastroenterology  4. Chest pain in adult Patient informed that chest pain was most likely musculoskeletal coming from the shoulder.  However she still wanted to follow through with seeing cardiology. - Ambulatory referral to Cardiology  5. Aortic atherosclerosis (HCC) Continue atorvastatin  6. Easy bruising I  think she probably had some broken varicose veins under the skin.  We will check CBC and PT/PTT - CBC - PT AND PTT  7. Thrombophlebitis Recommend warm compresses and baby aspirin daily for about a week.  8. Acute pain of right shoulder - Ambulatory referral to Orthopedic Surgery  9. Underweight - Amb ref to Medical Nutrition Therapy-MNT   Patient was given the opportunity to ask questions.  Patient verbalized understanding of the plan and was able to repeat key elements of the plan.   This documentation was completed using Radio producer.  Any transcriptional errors are unintentional.  Orders Placed This Encounter  Procedures   CBC   PT AND PTT   Ambulatory referral to Gastroenterology   Ambulatory referral to Cardiology   Ambulatory referral to Orthopedic Surgery   Amb ref to Medical Nutrition Therapy-MNT     Requested Prescriptions   Signed Prescriptions Disp Refills   hydrALAZINE (APRESOLINE) 50 MG tablet 135 tablet 6    Sig: Take 1.5 tablets (75 mg total) by mouth 3 (three) times daily.    Return in about 4 months (around 04/01/2022) for Appt with [facility] in 4 wks for BP check.  Karle Plumber, MD, FACP

## 2021-11-30 LAB — CBC
Hematocrit: 40.1 % (ref 34.0–46.6)
Hemoglobin: 13.8 g/dL (ref 11.1–15.9)
MCH: 29.5 pg (ref 26.6–33.0)
MCHC: 34.4 g/dL (ref 31.5–35.7)
MCV: 86 fL (ref 79–97)
Platelets: 227 10*3/uL (ref 150–450)
RBC: 4.68 x10E6/uL (ref 3.77–5.28)
RDW: 12.7 % (ref 11.7–15.4)
WBC: 7.8 10*3/uL (ref 3.4–10.8)

## 2021-11-30 LAB — PT AND PTT
INR: 1.1 (ref 0.9–1.2)
Prothrombin Time: 11.1 s (ref 9.1–12.0)
aPTT: 29 s (ref 24–33)

## 2021-12-06 ENCOUNTER — Ambulatory Visit (INDEPENDENT_AMBULATORY_CARE_PROVIDER_SITE_OTHER): Payer: 59 | Admitting: Orthopedic Surgery

## 2021-12-06 ENCOUNTER — Ambulatory Visit (INDEPENDENT_AMBULATORY_CARE_PROVIDER_SITE_OTHER): Payer: 59

## 2021-12-06 DIAGNOSIS — M25511 Pain in right shoulder: Secondary | ICD-10-CM | POA: Diagnosis not present

## 2021-12-09 ENCOUNTER — Encounter: Payer: Self-pay | Admitting: Orthopedic Surgery

## 2021-12-09 NOTE — Progress Notes (Signed)
Office Visit Note   Lori Bush: Lori Bush           Date of Birth: October 17, 1960           MRN: [id number] Visit Date: 12/06/2021 Requested by: Ladell Pier, MD [place],  [place] [address] PCP: Ladell Pier, MD  Subjective: Chief Complaint  Lori Bush presents with   Right Shoulder - Pain    HPI: Lori Bush is a 61 year old Lori Bush with right shoulder pain.  Been going on and off for several months.  Denies any history of injury.  The pain wakes her from sleep at night.  She reports pain in the shoulder which radiates into the scapula.  Denies much in the way of neck pain with no numbness and tingling.  Did have prior injection about 2 years ago and does not want that again.  She works as a Chief Technology Officer.  She also states her grip strength is decreasing.  Hard for her to lift things.  Does take Tylenol for symptoms.  No prior surgery on the shoulder or neck.              ROS: All systems reviewed are negative as they relate to the chief complaint within the history of present illness.  Lori Bush denies  fevers or chills.   Assessment & Plan: Visit Diagnoses:  1. Right shoulder pain, unspecified chronicity     Plan: Impression is right shoulder pain with very mild loss of motion.  Could be early frozen shoulder or nonradiographic apparent rotator cuff pathology versus atypical radiculopathy.  Been going on for several months.  Waking her from sleep at night.  Has failed conservative measures including activity modification as well as over-the-counter medication.  MRI arthrogram of the shoulder indicated to evaluate for potentially treatable pathology.  Early frozen shoulder is a possibility as well.  Follow-up after that study.  Follow-Up Instructions: No follow-ups on file.   Orders:  Orders Placed This Encounter  Procedures   XR Shoulder Right   No orders of the defined types were placed in this encounter.     Procedures: No procedures  performed   Clinical Data: No additional findings.  Objective: Vital Signs: There were no vitals taken for this visit.  Physical Exam:   Constitutional: Lori Bush appears well-developed HEENT:  Head: Normocephalic Eyes:EOM are normal Neck: Normal range of motion Cardiovascular: Normal rate Pulmonary/chest: Effort normal Neurologic: Lori Bush is alert Skin: Skin is warm Psychiatric: Lori Bush has normal mood and affect   Ortho Exam: Ortho exam demonstrates range of motion on the right 50/90/165.  Range of motion on the left is 55/100/170.  Rotator cuff strength is good infraspinatus supraspinatus and subscap strength testing with no discrete AC joint tenderness and full cervical spine range of motion.  No masses lymphadenopathy or skin changes noted in that shoulder girdle region.  Radial pulse intact bilaterally.  No scapular dyskinesia with forward flexion.  No discrete paresthesias C5-T1.  No other masses lymphadenopathy or skin changes noted in the shoulder girdle region.  Neck range of motion full.  Specialty Comments:  No specialty comments available.  Imaging: No results found.   PMFS History: Lori Bush Active Problem List   Diagnosis Date Noted   Elevated blood pressure reading with diagnosis of hypertension 03/19/2021   Aortic atherosclerosis ([place]) 11/27/2020   Renal mass 52/84/1324   Helicobacter pylori (H. pylori) infection 02/27/2018   Lung nodule, multiple 02/27/2018   Tobacco dependence 03/17/2017  Cyst of skin 03/17/2017   Primary osteoarthritis of right knee 10/28/2016   Chronic hepatitis C without hepatic coma ([place]) 03/26/2016   Chronic right shoulder pain 01/17/2015   Hypertension 03/05/2013   Past Medical History:  Diagnosis Date   Arthritis Dx 2012   Hyperlipidemia    Hypertension Dx 2005   Seasonal allergies     Family History  Problem Relation Age of Onset   Hypertension Mother    Heart disease Mother    Hypertension Father    Dementia Father     Arthritis Sister    Hypertension Sister    Arthritis Brother    Hypertension Brother    Colon cancer Neg Hx    Stomach cancer Neg Hx    Rectal cancer Neg Hx    Esophageal cancer Neg Hx    Colon polyps Neg Hx     Past Surgical History:  Procedure Laterality Date   CESAREAN SECTION     COLONOSCOPY     COLONOSCOPY WITH ESOPHAGOGASTRODUODENOSCOPY (EGD)  03/2007   HERNIA REPAIR  1971   as a child, umbilical   WISDOM TOOTH EXTRACTION     Social History   Occupational History   Occupation: Publishing copy  Tobacco Use   Smoking status: Former    Packs/day: 0.50    Types: Cigarettes   Smokeless tobacco: Never   Tobacco comments:    states she cut back 3 cigarettes/day  Vaping Use   Vaping Use: Never used  Substance and Sexual Activity   Alcohol use: No   Drug use: No   Sexual activity: Not Currently

## 2021-12-10 ENCOUNTER — Other Ambulatory Visit: Payer: Self-pay

## 2021-12-10 DIAGNOSIS — M25511 Pain in right shoulder: Secondary | ICD-10-CM

## 2021-12-13 ENCOUNTER — Other Ambulatory Visit: Payer: Self-pay

## 2021-12-13 ENCOUNTER — Other Ambulatory Visit: Payer: Self-pay | Admitting: Internal Medicine

## 2021-12-13 DIAGNOSIS — I10 Essential (primary) hypertension: Secondary | ICD-10-CM

## 2021-12-13 MED ORDER — LOSARTAN POTASSIUM 100 MG PO TABS
100.0000 mg | ORAL_TABLET | Freq: Every day | ORAL | 0 refills | Status: DC
  Filled 2021-12-13: qty 30, 30d supply, fill #0

## 2021-12-13 MED ORDER — CARVEDILOL 25 MG PO TABS
ORAL_TABLET | Freq: Two times a day (BID) | ORAL | 0 refills | Status: DC
  Filled 2021-12-13: qty 60, 30d supply, fill #0
  Filled 2022-03-18: qty 60, 30d supply, fill #1
  Filled 2022-07-09: qty 60, 30d supply, fill #2

## 2021-12-13 NOTE — Telephone Encounter (Signed)
Requested medication (s) are due for refill today - expired Rx  Requested medication (s) are on the active medication list -yes  Future visit scheduled -yes  Last refill: losartan: 11/22/20 #30 5RF                  Carvedilol: 11/13/20 #180 3RF  Notes to clinic: Expired Rx  Requested Prescriptions  Pending Prescriptions Disp Refills   losartan (COZAAR) 100 MG tablet 30 tablet 5    Sig: Take 1 tablet (100 mg total) by mouth daily. Start after you complete the bottle of Losartan/Hydrochlorothiazide     Cardiovascular:  Angiotensin Receptor Blockers Failed - 12/13/2021  9:52 AM      Failed - Last BP in normal range    BP Readings from Last 1 Encounters:  11/29/21 (!) 158/89         Passed - Cr in normal range and within 180 days    Creat  Date Value Ref Range Status  09/16/2017 0.84 0.50 - 1.05 mg/dL Final    Comment:    For patients >56 years of age, the reference limit for Creatinine is approximately 13% higher for people identified as African-American. .    Creatinine, Ser  Date Value Ref Range Status  06/27/2021 0.83 0.44 - 1.00 mg/dL Final         Passed - K in normal range and within 180 days    Potassium  Date Value Ref Range Status  06/27/2021 3.5 3.5 - 5.1 mmol/L Final         Passed - Patient is not pregnant      Passed - Valid encounter within last 6 months    Recent Outpatient Visits           2 weeks ago Essential hypertension   [place] Ladell Pier, MD   3 months ago Globus sensation   [place] Ladell Pier, MD   8 months ago Globus sensation   [place], MD   11 months ago Pap smear for cervical cancer screening   [place], Deborah B, MD   11 months ago Patient left before evaluation by [place], [place], MD       Future  Appointments             In 4 days Nahser, Wonda Cheng, MD [facility] [address] Office, [facility]   In 1 week Ladell Pier, MD [place]             carvedilol (COREG) 25 MG tablet 180 tablet 3    Sig: TAKE 1 TABLET (25 MG TOTAL) BY MOUTH 2 (TWO) TIMES DAILY WITH A MEAL.     Cardiovascular: Beta Blockers 3 Failed - 12/13/2021  9:52 AM      Failed - Last BP in normal range    BP Readings from Last 1 Encounters:  11/29/21 (!) 158/89         Passed - Cr in normal range and within 360 days    Creat  Date Value Ref Range Status  09/16/2017 0.84 0.50 - 1.05 mg/dL Final    Comment:    For patients >101 years of age, the reference limit for Creatinine is approximately 13% higher for people identified as African-American. .    Creatinine, Ser  Date Value Ref Range Status  06/27/2021 0.83 0.44 - 1.00 mg/dL Final         Passed - AST in normal range and within 360 days    AST  Date Value Ref Range Status  06/27/2021 18 15 - 41 U/L Final         Passed - ALT in normal range and within 360 days    ALT  Date Value Ref Range Status  06/27/2021 12 0 - 44 U/L Final  12/23/2016 39 (H) 6 - 29 U/L Final         Passed - Last Heart Rate in normal range    Pulse Readings from Last 1 Encounters:  11/29/21 84         Passed - Valid encounter within last 6 months    Recent Outpatient Visits           2 weeks ago Essential hypertension   [place] Ladell Pier, MD   3 months ago Globus sensation   [place] Ladell Pier, MD   8 months ago Globus sensation   [place], MD   11 months ago Pap smear for cervical cancer screening   [place] Ladell Pier, MD   11 months ago Patient left before evaluation by [place], Deborah  B, MD       Future Appointments             In 4 days Nahser, Wonda Cheng, MD [place], [facility]   In 1 week Ladell Pier, MD [place]               Requested Prescriptions  Pending Prescriptions Disp Refills   losartan (COZAAR) 100 MG tablet 30 tablet 5    Sig: Take 1 tablet (100 mg total) by mouth daily. Start after you complete the bottle of Losartan/Hydrochlorothiazide     Cardiovascular:  Angiotensin Receptor Blockers Failed - 12/13/2021  9:52 AM      Failed - Last BP in normal range    BP Readings from Last 1 Encounters:  11/29/21 (!) 158/89         Passed - Cr in normal range and within 180 days    Creat  Date Value Ref Range Status  09/16/2017 0.84 0.50 - 1.05 mg/dL Final    Comment:    For patients >55 years of age, the reference limit for Creatinine is approximately 13% higher for people identified as African-American. .    Creatinine, Ser  Date Value Ref Range Status  06/27/2021 0.83 0.44 - 1.00 mg/dL Final         Passed - K in normal range and within 180 days    Potassium  Date Value Ref Range Status  06/27/2021 3.5 3.5 - 5.1 mmol/L Final         Passed - Patient is not pregnant      Passed - Valid encounter within last 6 months    Recent Outpatient Visits           2 weeks ago Essential hypertension   [place] Ladell Pier, MD   3 months ago Globus sensation   [place] Ladell Pier, MD   8 months ago Globus  sensation   [place] Ladell Pier, MD   11 months ago Pap smear for cervical cancer screening   [place], MD   11 months ago Patient left before evaluation by [place] Ladell Pier, MD       Future Appointments             In 4 days Nahser, Wonda Cheng,  MD [facility] [address] Office, [facility]   In 1 week Ladell Pier, MD [place]             carvedilol (COREG) 25 MG tablet 180 tablet 3    Sig: TAKE 1 TABLET (25 MG TOTAL) BY MOUTH 2 (TWO) TIMES DAILY WITH A MEAL.     Cardiovascular: Beta Blockers 3 Failed - 12/13/2021  9:52 AM      Failed - Last BP in normal range    BP Readings from Last 1 Encounters:  11/29/21 (!) 158/89         Passed - Cr in normal range and within 360 days    Creat  Date Value Ref Range Status  09/16/2017 0.84 0.50 - 1.05 mg/dL Final    Comment:    For patients >72 years of age, the reference limit for Creatinine is approximately 13% higher for people identified as African-American. .    Creatinine, Ser  Date Value Ref Range Status  06/27/2021 0.83 0.44 - 1.00 mg/dL Final         Passed - AST in normal range and within 360 days    AST  Date Value Ref Range Status  06/27/2021 18 15 - 41 U/L Final         Passed - ALT in normal range and within 360 days    ALT  Date Value Ref Range Status  06/27/2021 12 0 - 44 U/L Final  12/23/2016 39 (H) 6 - 29 U/L Final         Passed - Last Heart Rate in normal range    Pulse Readings from Last 1 Encounters:  11/29/21 84         Passed - Valid encounter within last 6 months    Recent Outpatient Visits           2 weeks ago Essential hypertension   [place], MD   3 months ago Globus sensation   [place], MD   8 months ago Globus sensation   [place], MD   11 months ago Pap smear for cervical cancer screening   [place], MD   11 months ago Patient left before evaluation by [place], MD       Future Appointments             In 4  days Nahser, Wonda Cheng, MD [place], [facility]   In 1 week Ladell Pier, MD [place]

## 2021-12-17 ENCOUNTER — Ambulatory Visit: Payer: 59 | Admitting: Cardiovascular Disease

## 2021-12-20 ENCOUNTER — Encounter: Payer: Self-pay | Admitting: Internal Medicine

## 2021-12-20 ENCOUNTER — Other Ambulatory Visit: Payer: Self-pay

## 2021-12-20 ENCOUNTER — Ambulatory Visit: Payer: 59 | Attending: Internal Medicine | Admitting: Internal Medicine

## 2021-12-20 VITALS — BP 156/94 | HR 70 | Temp 97.4°F | Resp 16 | Wt 94.0 lb

## 2021-12-20 DIAGNOSIS — I10 Essential (primary) hypertension: Secondary | ICD-10-CM | POA: Diagnosis not present

## 2021-12-20 DIAGNOSIS — Z8619 Personal history of other infectious and parasitic diseases: Secondary | ICD-10-CM

## 2021-12-20 MED ORDER — CARVEDILOL 25 MG PO TABS: ORAL_TABLET | Freq: Two times a day (BID) | ORAL | 3 refills | Status: DC

## 2021-12-20 MED ORDER — AMLODIPINE BESYLATE 10 MG PO TABS
10.0000 mg | ORAL_TABLET | Freq: Every day | ORAL | 3 refills | Status: DC
  Filled 2022-03-22: qty 30, 30d supply, fill #0
  Filled 2022-05-14: qty 30, 30d supply, fill #1
  Filled 2022-07-09: qty 30, 30d supply, fill #2
  Filled 2022-09-05 (×2): qty 30, 30d supply, fill #3

## 2021-12-20 MED ORDER — LOSARTAN POTASSIUM 100 MG PO TABS
100.0000 mg | ORAL_TABLET | Freq: Every day | ORAL | 2 refills | Status: DC
  Filled 2021-12-20: qty 30, 30d supply, fill #0
  Filled 2022-03-18: qty 30, 30d supply, fill #1
  Filled 2022-07-09: qty 30, 30d supply, fill #2
  Filled 2022-09-05 (×2): qty 30, 30d supply, fill #3

## 2021-12-20 NOTE — Progress Notes (Signed)
Patient ID: Lori Bush, female    DOB: 1960/08/02  MRN: [id number]  CC: Hypertension   Subjective: Lori Bush is a 61 y.o. female who presents for f/u HTN Her concerns today include:  Hx of HTN, hep C cured with Harvoni, tobacco dep, wgh loss  HTN: Reports compliance with amlodipine 10 mg daily, Cozaar 100 mg daily, hydralazine 75 mg 3 times a day and carvedilol 25 mg twice a day.  She has been out of the carvedilol x2 days.  Needs refill on amlodipine and Cozaar. Checking BP once a day: 141/90, 128/78.  This a.m was 139/94 20 mins after BF Limits salt.    Since last visit with me, she has seen Dr. Marlou Sa for the right shoulder pain.  Plain x-ray was negative.  He has ordered an MRI of the shoulder.  She tells me that she has also been called by the cardiologist and nutritionist as well and has appointment already set up.  Patient with history of hepatitis C that has been treated with Harvoni.   Patient Active Problem List   Diagnosis Date Noted   Elevated blood pressure reading with diagnosis of hypertension 03/19/2021   Aortic atherosclerosis ([place]) 11/27/2020   Renal mass 69/62/9528   Helicobacter pylori (H. pylori) infection 02/27/2018   Lung nodule, multiple 02/27/2018   Tobacco dependence 03/17/2017   Cyst of skin 03/17/2017   Primary osteoarthritis of right knee 10/28/2016   Chronic hepatitis C without hepatic coma ([place]) 03/26/2016   Chronic right shoulder pain 01/17/2015   Hypertension 03/05/2013     Current Outpatient Medications on File Prior to Visit  Medication Sig Dispense Refill   albuterol (VENTOLIN HFA) 108 (90 Base) MCG/ACT inhaler Inhale 2 puffs into the lungs every 4 (four) hours as needed for wheezing or shortness of breath. 18 g 2   atorvastatin (LIPITOR) 10 MG tablet Take 1 tablet (10 mg total) by mouth daily. 90 tablet 3   fluticasone (FLONASE) 50 MCG/ACT nasal spray Place 2 sprays into both nostrils daily.     hydrALAZINE  (APRESOLINE) 50 MG tablet Take 1.5 tablets (75 mg total) by mouth 3 (three) times daily. 135 tablet 6   ibuprofen (ADVIL) 600 MG tablet TAKE 1 TABLET EVERY 6 HOURS AS NEEDED. (Patient taking differently: Take 600 mg by mouth every 6 (six) hours as needed for headache or mild pain.) 30 tablet 0   No current facility-administered medications on file prior to visit.    Allergies  Allergen Reactions   Hydrochlorothiazide     Causes low Potassium and high calcium.    Social History   Socioeconomic History   Marital status: Single    Spouse name: Not on file   Number of children: 5   Years of education: Not on file   Highest education level: Not on file  Occupational History   Occupation: Publishing copy  Tobacco Use   Smoking status: Former    Packs/day: 0.50    Types: Cigarettes   Smokeless tobacco: Never   Tobacco comments:    states she cut back 3 cigarettes/day  Vaping Use   Vaping Use: Never used  Substance and Sexual Activity   Alcohol use: No   Drug use: No   Sexual activity: Not Currently  Other Topics Concern   Not on file  Social History Narrative   Not on file   Social Determinants of Health   Financial Resource Strain: Not on file  Food Insecurity: Not on file  Transportation  Needs: Not on file  Physical Activity: Not on file  Stress: Not on file  Social Connections: Not on file  Intimate Partner Violence: Not on file    Family History  Problem Relation Age of Onset   Hypertension Mother    Heart disease Mother    Hypertension Father    Dementia Father    Arthritis Sister    Hypertension Sister    Arthritis Brother    Hypertension Brother    Colon cancer Neg Hx    Stomach cancer Neg Hx    Rectal cancer Neg Hx    Esophageal cancer Neg Hx    Colon polyps Neg Hx     Past Surgical History:  Procedure Laterality Date   CESAREAN SECTION     COLONOSCOPY     COLONOSCOPY WITH ESOPHAGOGASTRODUODENOSCOPY (EGD)  03/2007   HERNIA REPAIR  1971   as  a child, umbilical   WISDOM TOOTH EXTRACTION      ROS: Review of Systems Negative except as stated above  PHYSICAL EXAM: BP (!) 156/94 (BP Location: Left Arm, Patient Position: Sitting, Cuff Size: Normal)   Pulse 70   Temp (!) 97.4 F (36.3 C) (Oral)   Resp 16   Wt 94 lb (42.6 kg)   SpO2 98%   BMI 15.17 kg/m   Physical Exam Repeat BP 160/90 General appearance - alert, well appearing, and in no distress Chest - clear to auscultation, no wheezes, rales or rhonchi, symmetric air entry Heart - normal rate, regular rhythm, normal S1, S2, no murmurs, rubs, clicks or gallops      Latest Ref Rng & Units 06/27/2021    2:46 PM 11/20/2020   10:18 AM 04/06/2020   12:15 PM  CMP  Glucose 70 - 99 mg/dL 88  92  82   BUN 6 - 20 mg/dL '13  16  15   '$ Creatinine 0.44 - 1.00 mg/dL 0.83  0.85  0.77   Sodium 135 - 145 mmol/L 142  139  138   Potassium 3.5 - 5.1 mmol/L 3.5  3.4  3.7   Chloride 98 - 111 mmol/L 106  102  101   CO2 22 - 32 mmol/L '25  22  25   '$ Calcium 8.9 - 10.3 mg/dL 10.4  10.7  10.4   Total Protein 6.5 - 8.1 g/dL 7.1  6.9    Total Bilirubin 0.3 - 1.2 mg/dL 0.6  0.3    Alkaline Phos 38 - 126 U/L 63  82    AST 15 - 41 U/L 18  12    ALT 0 - 44 U/L 12  9     Lipid Panel     Component Value Date/Time   CHOL 191 11/20/2020 1018   TRIG 58 11/20/2020 1018   HDL 66 11/20/2020 1018   CHOLHDL 2.9 11/20/2020 1018   CHOLHDL 3.0 09/30/2013 1509   VLDL 25 09/30/2013 1509   LDLCALC 114 (H) 11/20/2020 1018    CBC    Component Value Date/Time   WBC 7.8 11/29/2021 1553   WBC 7.2 06/27/2021 1446   RBC 4.68 11/29/2021 1553   RBC 4.70 06/27/2021 1446   HGB 13.8 11/29/2021 1553   HCT 40.1 11/29/2021 1553   PLT 227 11/29/2021 1553   MCV 86 11/29/2021 1553   MCH 29.5 11/29/2021 1553   MCH 29.1 06/27/2021 1446   MCHC 34.4 11/29/2021 1553   MCHC 33.7 06/27/2021 1446   RDW 12.7 11/29/2021 1553   LYMPHSABS 2.4 06/27/2021 1446  MONOABS 0.6 06/27/2021 1446   EOSABS 0.4 06/27/2021 1446    BASOSABS 0.1 06/27/2021 1446    ASSESSMENT AND PLAN: 1. Essential hypertension Not at goal She has been out of one of her blood pressure medicine carvedilol x2 days.  Refill sent on carvedilol, amlodipine and Cozaar.  Continue hydralazine 75 mg 3 times a day.   Follow-up with clinical pharmacist in 3 weeks for repeat blood pressure check.  Advised to bring her blood pressure device with her. - carvedilol (COREG) 25 MG tablet; TAKE 1 TABLET (25 MG TOTAL) BY MOUTH 2 (TWO) TIMES DAILY WITH A MEAL.  Dispense: 180 tablet; Refill: 3 - amLODipine (NORVASC) 10 MG tablet; Take 1 tablet (10 mg total) by mouth once daily. Mush have office visit for refills  Dispense: 90 tablet; Refill: 3 - losartan (COZAAR) 100 MG tablet; Take 1 tablet (100 mg total) by mouth once daily.  Dispense: 90 tablet; Refill: 2  2. History of hepatitis C Treated.    Patient was given the opportunity to ask questions.  Patient verbalized understanding of the plan and was able to repeat key elements of the plan.   This documentation was completed using Radio producer.  Any transcriptional errors are unintentional.  No orders of the defined types were placed in this encounter.    Requested Prescriptions   Signed Prescriptions Disp Refills   carvedilol (COREG) 25 MG tablet 180 tablet 3    Sig: TAKE 1 TABLET (25 MG TOTAL) BY MOUTH 2 (TWO) TIMES DAILY WITH A MEAL.   amLODipine (NORVASC) 10 MG tablet 90 tablet 3    Sig: Take 1 tablet (10 mg total) by mouth once daily. Mush have office visit for refills   losartan (COZAAR) 100 MG tablet 90 tablet 2    Sig: Take 1 tablet (100 mg total) by mouth once daily.    Return in about 4 months (around 04/21/2022) for Appt with [facility] in 3 wks for BP check.  Karle Plumber, MD, FACP

## 2021-12-20 NOTE — Progress Notes (Signed)
Follow up HTN 

## 2021-12-21 ENCOUNTER — Other Ambulatory Visit: Payer: Self-pay

## 2021-12-26 ENCOUNTER — Other Ambulatory Visit: Payer: 59

## 2021-12-26 ENCOUNTER — Inpatient Hospital Stay: Admission: RE | Admit: 2021-12-26 | Payer: 59 | Source: Ambulatory Visit

## 2022-01-02 ENCOUNTER — Ambulatory Visit: Payer: 59 | Admitting: Orthopedic Surgery

## 2022-01-21 ENCOUNTER — Other Ambulatory Visit: Payer: Self-pay | Admitting: Internal Medicine

## 2022-01-22 NOTE — Telephone Encounter (Signed)
Requested medications are due for refill today.  yes  Requested medications are on the active medications list.  yes  Last refill. 11/22/2020 #90 3 refills  Future visit scheduled.   no  Notes to clinic.  Labs are expired.    Requested Prescriptions  Pending Prescriptions Disp Refills   atorvastatin (LIPITOR) 10 MG tablet 90 tablet 3    Sig: Take 1 tablet (10 mg total) by mouth daily.     Cardiovascular:  Antilipid - Statins Failed - 01/21/2022  1:51 PM      Failed - Lipid Panel in normal range within the last 12 months    Cholesterol, Total  Date Value Ref Range Status  11/20/2020 191 100 - 199 mg/dL Final   LDL Chol Calc (NIH)  Date Value Ref Range Status  11/20/2020 114 (H) 0 - 99 mg/dL Final   HDL  Date Value Ref Range Status  11/20/2020 66 >39 mg/dL Final   Triglycerides  Date Value Ref Range Status  11/20/2020 58 0 - 149 mg/dL Final         Passed - Patient is not pregnant      Passed - Valid encounter within last 12 months    Recent Outpatient Visits           1 month ago Essential hypertension   [place], Deborah B, MD   1 month ago Essential hypertension   [place] Ladell Pier, MD   5 months ago Globus sensation   [place] Ladell Pier, MD   9 months ago Globus sensation   [place] Ladell Pier, MD   1 year ago Pap smear for cervical cancer screening   [place] Ladell Pier, MD

## 2022-01-23 ENCOUNTER — Other Ambulatory Visit: Payer: Self-pay

## 2022-01-23 ENCOUNTER — Ambulatory Visit: Payer: 59 | Admitting: Cardiovascular Disease

## 2022-01-23 MED ORDER — ATORVASTATIN CALCIUM 10 MG PO TABS
10.0000 mg | ORAL_TABLET | Freq: Every day | ORAL | 1 refills | Status: DC
  Filled 2022-01-23 – 2022-02-04 (×2): qty 30, 30d supply, fill #0
  Filled 2022-03-18: qty 30, 30d supply, fill #1
  Filled 2022-05-14: qty 30, 30d supply, fill #2
  Filled 2022-08-15: qty 30, 30d supply, fill #3

## 2022-01-29 ENCOUNTER — Other Ambulatory Visit: Payer: Self-pay

## 2022-02-04 ENCOUNTER — Other Ambulatory Visit: Payer: Self-pay

## 2022-02-20 ENCOUNTER — Ambulatory Visit: Payer: 59 | Admitting: Registered"

## 2022-02-25 ENCOUNTER — Other Ambulatory Visit: Payer: Self-pay | Admitting: Internal Medicine

## 2022-02-25 DIAGNOSIS — I10 Essential (primary) hypertension: Secondary | ICD-10-CM

## 2022-02-26 ENCOUNTER — Ambulatory Visit: Payer: 59 | Admitting: Registered"

## 2022-02-26 NOTE — Telephone Encounter (Signed)
Rx 12/20/21 #90 3RF- 1 yr Rx Requested Prescriptions  Pending Prescriptions Disp Refills  . amLODipine (NORVASC) 10 MG tablet 30 tablet 6    Sig: Take 1 tablet (10 mg total) by mouth daily. Mush have office visit for refills     Cardiovascular: Calcium Channel Blockers 2 Failed - 02/25/2022 12:47 PM      Failed - Last BP in normal range    BP Readings from Last 1 Encounters:  12/20/21 (!) 156/94         Passed - Last Heart Rate in normal range    Pulse Readings from Last 1 Encounters:  12/20/21 70         Passed - Valid encounter within last 6 months    Recent Outpatient Visits          2 months ago Essential hypertension   [place] Ladell Pier, MD   2 months ago Essential hypertension   [place] Ladell Pier, MD   6 months ago Globus sensation   [place] Ladell Pier, MD   10 months ago Globus sensation   [place] Ladell Pier, MD   1 year ago Pap smear for cervical cancer screening   [place] Ladell Pier, MD

## 2022-03-05 ENCOUNTER — Other Ambulatory Visit: Payer: Self-pay

## 2022-03-19 ENCOUNTER — Other Ambulatory Visit: Payer: Self-pay

## 2022-03-22 ENCOUNTER — Other Ambulatory Visit: Payer: Self-pay

## 2022-03-27 ENCOUNTER — Telehealth: Payer: Self-pay

## 2022-03-27 ENCOUNTER — Ambulatory Visit: Payer: 59

## 2022-03-27 NOTE — Telephone Encounter (Signed)
Pt. Reports she has appointment at 2:00. "I think it's a TB shot. I'm not sure." Encouraged pt. To keep appointment.

## 2022-03-29 ENCOUNTER — Ambulatory Visit: Payer: 59 | Attending: Nurse Practitioner

## 2022-03-29 DIAGNOSIS — Z111 Encounter for screening for respiratory tuberculosis: Secondary | ICD-10-CM

## 2022-04-02 ENCOUNTER — Ambulatory Visit: Payer: Self-pay | Attending: Internal Medicine

## 2022-04-02 DIAGNOSIS — Z111 Encounter for screening for respiratory tuberculosis: Secondary | ICD-10-CM

## 2022-04-04 LAB — TB SKIN TEST
Induration: 0 mm
TB Skin Test: NEGATIVE

## 2022-04-09 ENCOUNTER — Ambulatory Visit: Payer: 59 | Admitting: Gastroenterology

## 2022-04-09 NOTE — Progress Notes (Deleted)
[facility] Follow up Note:  History: Lori Bush 04/09/2022  Referring provider: Ladell Pier, MD  Reason for consult/chief complaint: No chief complaint on file.   Subjective  HPI:  *** From last office note with APP in November 2022: "# 61 yo female with two months of globus sensation and dysphagia to certain solid foods such as pasta and rice. No history of GERD. Sinus drainage can cause globus sensation but her sinus issues are chronic. She is a longtime tobacco smoker. Rule out esophageal stricture, esophageal neoplasm, esophageal dysmotility.  Weight down 4 pounds ( ~ 5% of TBW) since May.  --Scheduled for EGD with possible dilation. The risks and benefits of EGD with possible biopsies were discussed with the patient who agrees to proceed.  Dr. Loletha Carrow doesn't haven't any available endoscopy time until late December. Procedure will be done by Dr. Carlean Purl.    # Colon cancer screening. Colonoscopy in July 2019 but prep was poor with very limited visualization of the mucosa.  I will ask Dr. Loletha Carrow to decide on when he would like to repeat the colonoscopy.  In an effort to expedite dysphagia weight loss I did not add it on her upcoming EGD.  -- When colonoscopy does get scheduled patient will need a 2-day bowel prep"  She had an EGD with Dr. Carlean Purl shortly after that that was a normal study, and an empiric 54 French bougie dilation was performed.  I intended to speak with Lori Bush about this more at her scheduled screening colonoscopy the following month, but she had canceled or did not show for that exam.  Of note, she had a poor preparation for screening colonoscopy in 2019.   ROS:  Review of Systems   Past Medical History: Past Medical History:  Diagnosis Date   Arthritis Dx 2012   Hyperlipidemia    Hypertension Dx 2005   Seasonal allergies      Past Surgical History: Past Surgical History:  Procedure Laterality Date   CESAREAN  SECTION     COLONOSCOPY     COLONOSCOPY WITH ESOPHAGOGASTRODUODENOSCOPY (EGD)  03/2007   HERNIA REPAIR  1971   as a child, umbilical   WISDOM TOOTH EXTRACTION       Family History: Family History  Problem Relation Age of Onset   Hypertension Mother    Heart disease Mother    Hypertension Father    Dementia Father    Arthritis Sister    Hypertension Sister    Arthritis Brother    Hypertension Brother    Colon cancer Neg Hx    Stomach cancer Neg Hx    Rectal cancer Neg Hx    Esophageal cancer Neg Hx    Colon polyps Neg Hx     Social History: Social History   Socioeconomic History   Marital status: Single    Spouse name: Not on file   Number of children: 5   Years of education: Not on file   Highest education level: Not on file  Occupational History   Occupation: Publishing copy  Tobacco Use   Smoking status: Former    Packs/day: 0.50    Types: Cigarettes   Smokeless tobacco: Never   Tobacco comments:    states she cut back 3 cigarettes/day  Vaping Use   Vaping Use: Never used  Substance and Sexual Activity   Alcohol use: No   Drug use: No   Sexual activity: Not Currently  Other Topics Concern   Not on file  Social History Narrative   Not on file   Social Determinants of Health   Financial Resource Strain: Not on file  Food Insecurity: Not on file  Transportation Needs: Not on file  Physical Activity: Not on file  Stress: Not on file  Social Connections: Not on file    Allergies: Allergies  Allergen Reactions   Hydrochlorothiazide     Causes low Potassium and high calcium.    Outpatient Meds: Current Outpatient Medications  Medication Sig Dispense Refill   albuterol (VENTOLIN HFA) 108 (90 Base) MCG/ACT inhaler Inhale 2 puffs into the lungs every 4 (four) hours as needed for wheezing or shortness of breath. 18 g 2   amLODipine (NORVASC) 10 MG tablet Take 1 tablet (10 mg total) by mouth daily. Mush have office visit for refills 30 tablet 6    amLODipine (NORVASC) 10 MG tablet Take 1 tablet (10 mg total) by mouth once daily. Mush have office visit for refills 90 tablet 3   atorvastatin (LIPITOR) 10 MG tablet Take 1 tablet (10 mg total) by mouth daily. 90 tablet 1   carvedilol (COREG) 25 MG tablet TAKE 1 TABLET (25 MG TOTAL) BY MOUTH 2 (TWO) TIMES DAILY WITH A MEAL. 180 tablet 0   carvedilol (COREG) 25 MG tablet TAKE 1 TABLET (25 MG TOTAL) BY MOUTH 2 (TWO) TIMES DAILY WITH A MEAL. 180 tablet 3   fluticasone (FLONASE) 50 MCG/ACT nasal spray Place 2 sprays into both nostrils daily.     hydrALAZINE (APRESOLINE) 50 MG tablet Take 1.5 tablets (75 mg total) by mouth 3 (three) times daily. 135 tablet 6   ibuprofen (ADVIL) 600 MG tablet TAKE 1 TABLET EVERY 6 HOURS AS NEEDED. (Patient taking differently: Take 600 mg by mouth every 6 (six) hours as needed for headache or mild pain.) 30 tablet 0   losartan (COZAAR) 100 MG tablet Take 1 tablet (100 mg total) by mouth once daily. 90 tablet 2   No current facility-administered medications for this visit.      ___________________________________________________________________ Objective   Exam:  There were no vitals taken for this visit. Wt Readings from Last 3 Encounters:  12/20/21 94 lb (42.6 kg)  11/29/21 95 lb 12.8 oz (43.5 kg)  08/17/21 97 lb 9.6 oz (44.3 kg)    General: ***  Eyes: sclera anicteric, no redness ENT: oral mucosa moist without lesions, no cervical or supraclavicular lymphadenopathy CV: ***, no JVD, no peripheral edema Resp: clear to auscultation bilaterally, normal RR and effort noted GI: soft, *** tenderness, with active bowel sounds. No guarding or palpable organomegaly noted. Skin; warm and dry, no rash or jaundice noted Neuro: awake, alert and oriented x 3. Normal gross motor function and fluent speech  Labs:  ***  Radiologic Studies:  ***  Assessment: No diagnosis found.  ***  Plan:  ***  Thank you for the courtesy of this consult.  Please call  me with any questions or concerns.  Nelida Meuse III  CC: Referring provider noted above

## 2022-05-14 ENCOUNTER — Other Ambulatory Visit: Payer: Self-pay

## 2022-06-21 ENCOUNTER — Ambulatory Visit: Payer: Self-pay | Admitting: *Deleted

## 2022-06-21 NOTE — Telephone Encounter (Signed)
Reason for Disposition  Age > 60 years  (Exception: Brief heartbeat symptoms that went away and now feels well.)    Requested an appt for Monday.  Answer Assessment - Initial Assessment Questions 1. DESCRIPTION: "Please describe your heart rate or heartbeat that you are having" (e.g., fast/slow, regular/irregular, skipped or extra beats, "palpitations")   I feel like I'm having palpitation  or fluttering in my chest.  It's not ongoing.   I noticed it twice last week.  I work in a [employer] on my feet and it's really busy.   Happened twice last week.   I got short of breath when I had the palpitations then it would go away.    It would last 2 minutes or less. 2. ONSET: "When did it start?" (Minutes, hours or days)      It happened twice last week. 3. DURATION: "How long does it last" (e.g., seconds, minutes, hours)     2 minutes or so 4. PATTERN "Does it come and go, or has it been constant since it started?"  "Does it get worse with exertion?"   "Are you feeling it now?"     Intermittent 5. TAP: "Using your hand, can you tap out what you are feeling on a chair or table in front of you, so that I can hear?" (Note: not all patients can do this)       Not asked 6. HEART RATE: "Can you tell me your heart rate?" "How many beats in 15 seconds?"  (Note: not all patients can do this)       Not asked 7. RECURRENT SYMPTOM: "Have you ever had this before?" If Yes, ask: "When was the last time?" and "What happened that time?"      No 8. CAUSE: "What do you think is causing the palpitations?"     I don't know 9. CARDIAC HISTORY: "Do you have any history of heart disease?" (e.g., heart attack, angina, bypass surgery, angioplasty, arrhythmia)      No 10. OTHER SYMPTOMS: "Do you have any other symptoms?" (e.g., dizziness, chest pain, sweating, difficulty breathing)       I have a sinus infection too.   I'm using my Netti Pot and Claritin for 2 weeks and I'm not getting better.  I can't breath  through my nose. 11. PREGNANCY: "Is there any chance you are pregnant?" "When was your last menstrual period?"       N/A due to age  Protocols used: Heart Rate and Heartbeat Questions-A-AH  Chief Complaint: palpitations intermittently,    possibly sinus infection Symptoms: fluttering in chest and shortness of breath intermittently Frequency: Noticed it twice last week  Pertinent Negatives: Patient denies chest pain or having symptoms now. Disposition: '[]'$ ED /'[]'$ Urgent Care (no appt availability in office) / '[]'$ Appointment(In office/virtual)/ '[]'$  [place] [facility]/ '[]'$ Home Care/ '[]'$ Refused Recommended Disposition /'[x]'$ [facility]/ '[]'$  Follow-up with PCP Additional Notes: No appts. At [facility].   Pt. Is agreeable to going to the [facility] but wants to wait and go on Monday.   I went over the s/s to go to the ED for.    She verbalized understanding and was agreeable to going to the ED if any of the symptoms occurred.

## 2022-06-24 ENCOUNTER — Encounter: Payer: Self-pay | Admitting: Physician Assistant

## 2022-06-24 ENCOUNTER — Ambulatory Visit: Payer: Self-pay | Admitting: Physician Assistant

## 2022-06-24 ENCOUNTER — Other Ambulatory Visit: Payer: Self-pay

## 2022-06-24 VITALS — BP 156/90 | HR 79 | Ht 60.0 in | Wt 98.6 lb

## 2022-06-24 DIAGNOSIS — Z862 Personal history of diseases of the blood and blood-forming organs and certain disorders involving the immune mechanism: Secondary | ICD-10-CM

## 2022-06-24 DIAGNOSIS — Z8619 Personal history of other infectious and parasitic diseases: Secondary | ICD-10-CM

## 2022-06-24 DIAGNOSIS — J0110 Acute frontal sinusitis, unspecified: Secondary | ICD-10-CM

## 2022-06-24 DIAGNOSIS — I10 Essential (primary) hypertension: Secondary | ICD-10-CM

## 2022-06-24 DIAGNOSIS — R002 Palpitations: Secondary | ICD-10-CM

## 2022-06-24 DIAGNOSIS — K219 Gastro-esophageal reflux disease without esophagitis: Secondary | ICD-10-CM

## 2022-06-24 MED ORDER — AMOXICILLIN-POT CLAVULANATE 875-125 MG PO TABS
1.0000 | ORAL_TABLET | Freq: Two times a day (BID) | ORAL | 0 refills | Status: AC
  Filled 2022-06-24: qty 14, 7d supply, fill #0

## 2022-06-24 MED ORDER — OMEPRAZOLE 20 MG PO CPDR
20.0000 mg | DELAYED_RELEASE_CAPSULE | Freq: Every day | ORAL | 3 refills | Status: DC
  Filled 2022-06-24: qty 30, 30d supply, fill #0
  Filled 2022-08-15: qty 30, 30d supply, fill #1

## 2022-06-24 NOTE — Patient Instructions (Addendum)
To help with your sinus infection, you are going to take Augmentin twice a day for 7 days.  I encourage you to continue using the Flonase, Claritin and your Nettie pot.  To help with your acid reflux, I do encourage you to start using omeprazole once daily.  I did send that prescription to your pharmacy.  Your EKG was normal. I have started a referral for you to be seen by cardiology for further evaluation.  Your blood pressure is elevated today, I do encourage you to continue checking your blood pressure at home, keeping a written log and having available for all office visits.   We will call you with today's lab results.  Kennieth Rad, PA-C Physician Assistant [place] [email or web address]   Palpitations Palpitations are feelings that your heartbeat is irregular or is faster than normal. It may feel like your heart is fluttering or skipping a beat. Palpitations may be caused by many things, including smoking, caffeine, alcohol, stress, and certain medicines or drugs. Most causes of palpitations are not serious.  However, some palpitations can be a sign of a serious problem. Further tests and a thorough medical history will be done to find the cause of your palpitations. Your provider may order tests such as an ECG, labs, an echocardiogram, or an ambulatory continuous ECG monitor. Follow these instructions at home: Pay attention to any changes in your symptoms. Let your health care provider know about them. Take these actions to help manage your symptoms: Eating and drinking Follow instructions from your health care provider about eating or drinking restrictions. You may need to avoid foods and drinks that may cause palpitations. These may include: Caffeinated coffee, tea, soft drinks, and energy drinks. Chocolate. Alcohol. Diet pills. Lifestyle     Take steps to reduce your stress and anxiety. Things that can help you relax  include: Yoga. Mind-body activities, such as deep breathing, meditation, or using words and images to create positive thoughts (guided imagery). Physical activity, such as swimming, jogging, or walking. Tell your health care provider if your palpitations increase with activity. If you have chest pain or shortness of breath with activity, do not continue the activity until you are seen by your health care provider. Biofeedback. This is a method that helps you learn to use your mind to control things in your body, such as your heartbeat. Get plenty of rest and sleep. Keep a regular bed time. Do not use drugs, including cocaine or ecstasy. Do not use marijuana. Do not use any products that contain nicotine or tobacco. These products include cigarettes, chewing tobacco, and vaping devices, such as e-cigarettes. If you need help quitting, ask your health care provider. General instructions Take over-the-counter and prescription medicines only as told by your health care provider. Keep all follow-up visits. This is important. These may include visits for further testing if palpitations do not go away or get worse. Contact a health care provider if: You continue to have a fast or irregular heartbeat for a long period of time. You notice that your palpitations occur more often. Get help right away if: You have chest pain or shortness of breath. You have a severe headache. You feel dizzy or you faint. These symptoms may represent a serious problem that is an emergency. Do not wait to see if the symptoms will go away. Get medical help right away. Call your local emergency services (911 in the [place] Do not drive yourself to the hospital. Summary Palpitations are feelings  that your heartbeat is irregular or is faster than normal. It may feel like your heart is fluttering or skipping a beat. Palpitations may be caused by many things, including smoking, caffeine, alcohol, stress, certain medicines, and  drugs. Further tests and a thorough medical history may be done to find the cause of your palpitations. Get help right away if you faint or have chest pain, shortness of breath, severe headache, or dizziness. This information is not intended to replace advice given to you by your health care provider. Make sure you discuss any questions you have with your health care provider. Document Revised: 11/15/2020 Document Reviewed: 11/15/2020 Elsevier Patient Education  [employer].

## 2022-06-24 NOTE — Progress Notes (Unsigned)
Established Patient Office Visit  Subjective   Patient ID: Lori Bush, female    DOB: Jun 06, 1961  Age: 61 y.o. MRN: [id number]  Chief Complaint  Patient presents with   Sinusitis   Palpitations    Patient last episode, happened in church yesterday     Reports that she has been having episodes of rapid heart beating.  States she had 2 episodes last week, both episodes happened while she was lying in bed, felt that her heart was beating very fast, states that she did have some shortness of breath along with it and that it lasted approximately 1 minute.  States that she did turn over in her bed with a little relief.  States that she then again had an episode yesterday while at church while she was standing doing a "happy dance"  Denies any new medications, is drinking approximately 8 cups of water a day.  Does endorse that she has had an increased stressor over the past 2 weeks, states that she did have a newborn staying in her home.  States that this did interrupt her sleep, states that she did have improved sleep starting 2 nights ago.  States that she has also been experiencing acid reflux on a daily basis for the past couple of weeks, states that she feels that she has always been "gassy and bloated" but in the past week it has been uncomfortable wearing a bra.  Does endorse previous diagnosis of GERD, does not take anything for relief.  Denies NSAID use.  States that she does check her blood pressure at home, states that her blood pressure was elevated today, similar to reading in clinic.  States that it has been slightly over normal limits in the past few weeks.  States that she does take iron on a daily basis, states that she has previously been diagnosed with iron deficiency anemia.  States that she has been having sinus pressure for the past 2 weeks.  States that she has been having a yellow-ish white nasal discharge as well.  States that she has been using Flonase, Claritin,  and Nettie pot without much relief.  Denies sick contacts.     Past Medical History:  Diagnosis Date   Arthritis Dx 2012   Hyperlipidemia    Hypertension Dx 2005   Seasonal allergies    Social History   Socioeconomic History   Marital status: Single    Spouse name: Not on file   Number of children: 5   Years of education: Not on file   Highest education level: Not on file  Occupational History   Occupation: Publishing copy  Tobacco Use   Smoking status: Former    Packs/day: 0.50    Types: Cigarettes   Smokeless tobacco: Never   Tobacco comments:    states she cut back 3 cigarettes/day  Vaping Use   Vaping Use: Never used  Substance and Sexual Activity   Alcohol use: No   Drug use: No   Sexual activity: Not Currently  Other Topics Concern   Not on file  Social History Narrative   Not on file   Social Determinants of Health   Financial Resource Strain: Not on file  Food Insecurity: Not on file  Transportation Needs: Not on file  Physical Activity: Not on file  Stress: Not on file  Social Connections: Not on file  Intimate Partner Violence: Not on file   Family History  Problem Relation Age of Onset   Hypertension Mother  Heart disease Mother    Hypertension Father    Dementia Father    Arthritis Sister    Hypertension Sister    Arthritis Brother    Hypertension Brother    Colon cancer Neg Hx    Stomach cancer Neg Hx    Rectal cancer Neg Hx    Esophageal cancer Neg Hx    Colon polyps Neg Hx    Allergies  Allergen Reactions   Hydrochlorothiazide     Causes low Potassium and high calcium.    Review of Systems  Constitutional:  Negative for chills and fever.  HENT:  Positive for congestion and sinus pain. Negative for ear discharge, ear pain and sore throat.   Eyes: Negative.   Respiratory:  Positive for shortness of breath. Negative for cough and sputum production.   Cardiovascular:  Positive for palpitations. Negative for chest pain.   Gastrointestinal:  Positive for heartburn. Negative for constipation, diarrhea, nausea and vomiting.  Genitourinary: Negative.   Musculoskeletal: Negative.   Skin: Negative.   Neurological:  Negative for dizziness, focal weakness, seizures and weakness.  Endo/Heme/Allergies: Negative.   Psychiatric/Behavioral: Negative.        Objective:     BP (!) 156/90   Pulse 79   Ht 5' (1.524 m)   Wt 98 lb 9.6 oz (44.7 kg)   SpO2 98%   BMI 19.26 kg/m  BP Readings from Last 3 Encounters:  06/24/22 (!) 156/90  12/20/21 (!) 156/94  11/29/21 (!) 158/89   Wt Readings from Last 3 Encounters:  06/24/22 98 lb 9.6 oz (44.7 kg)  12/20/21 94 lb (42.6 kg)  11/29/21 95 lb 12.8 oz (43.5 kg)      Physical Exam Vitals and nursing note reviewed.  Constitutional:      Appearance: Normal appearance.  HENT:     Head:     Salivary Glands: Right salivary gland is not diffusely enlarged or tender. Left salivary gland is not diffusely enlarged or tender.     Right Ear: Tympanic membrane, ear canal and external ear normal.     Left Ear: Tympanic membrane, ear canal and external ear normal.     Nose:     Right Turbinates: Enlarged and swollen.     Left Turbinates: Enlarged.     Right Sinus: Maxillary sinus tenderness and frontal sinus tenderness present.     Left Sinus: Maxillary sinus tenderness and frontal sinus tenderness present.     Mouth/Throat:     Lips: Pink.     Mouth: Mucous membranes are moist.     Pharynx: No pharyngeal swelling.     Tonsils: No tonsillar exudate.  Cardiovascular:     Rate and Rhythm: Normal rate and regular rhythm.     Pulses: Normal pulses.     Heart sounds: Normal heart sounds.  Pulmonary:     Effort: Pulmonary effort is normal.     Breath sounds: Normal breath sounds.  Musculoskeletal:        General: Normal range of motion.     Cervical back: Normal range of motion and neck supple.  Skin:    General: Skin is warm and dry.  Neurological:     General: No  focal deficit present.     Mental Status: She is alert and oriented to person, place, and time.  Psychiatric:        Mood and Affect: Mood normal.        Behavior: Behavior normal.        Thought  Content: Thought content normal.        Judgment: Judgment normal.        Assessment & Plan:   Problem List Items Addressed This Visit       Cardiovascular and Mediastinum   Hypertension (Chronic)   Relevant Orders   Ambulatory referral to Cardiology   Elevated blood pressure reading with diagnosis of hypertension     Other   Palpitations - Primary   Relevant Orders   EKG 12-Lead (Completed)   Comp. Metabolic Panel (12) (Completed)   TSH (Completed)   Ambulatory referral to Cardiology   History of anemia   Relevant Orders   CBC with Differential/Platelet (Completed)   Iron, TIBC and Ferritin Panel (Completed)   History of Helicobacter pylori infection   Other Visit Diagnoses     Acute frontal sinusitis, recurrence not specified       Relevant Medications   amoxicillin-clavulanate (AUGMENTIN) 875-125 MG tablet   Gastroesophageal reflux disease without esophagitis       Relevant Medications   omeprazole (PRILOSEC) 20 MG capsule     1. Palpitations EKG within normal limits.  Patient encouraged to continue to monitor palpitations, work on improving sleep hygiene.  Patient education given on supportive care.  Refer to cardiology for further evaluation.  Red flags given for prompt reevaluation - EKG 12-Lead - Comp. Metabolic Panel (12) - TSH - Ambulatory referral to Cardiology  2. Acute frontal sinusitis, recurrence not specified Trial Augmentin.  Patient education given on supportive care - amoxicillin-clavulanate (AUGMENTIN) 875-125 MG tablet; Take 1 tablet by mouth 2 (two) times daily for 7 days.  Dispense: 14 tablet; Refill: 0  3. Primary hypertension  - Ambulatory referral to Cardiology  4. Elevated blood pressure reading with diagnosis of hypertension Patient  encouraged to continue checking blood pressure on a daily basis, keeping a written log and having available for all office visits.  5. History of anemia  - CBC with Differential/Platelet - Iron, TIBC and Ferritin Panel  6. Gastroesophageal reflux disease without esophagitis Trial Prilosec.  Patient education given on lifestyle modifications - omeprazole (PRILOSEC) 20 MG capsule; Take 1 capsule (20 mg total) by mouth daily.  Dispense: 30 capsule; Refill: 3  7. History of Helicobacter pylori infection    I have reviewed the patient's medical history (PMH, PSH, Social History, Family History, Medications, and allergies) , and have been updated if relevant. I spent 40 minutes reviewing chart and  face to face time with patient.     Return if symptoms worsen or fail to improve.    Loraine Grip Mayers, PA-C

## 2022-06-25 DIAGNOSIS — R002 Palpitations: Secondary | ICD-10-CM | POA: Insufficient documentation

## 2022-06-25 DIAGNOSIS — Z8619 Personal history of other infectious and parasitic diseases: Secondary | ICD-10-CM | POA: Insufficient documentation

## 2022-06-25 DIAGNOSIS — Z862 Personal history of diseases of the blood and blood-forming organs and certain disorders involving the immune mechanism: Secondary | ICD-10-CM | POA: Insufficient documentation

## 2022-06-25 LAB — CBC WITH DIFFERENTIAL/PLATELET
Basophils Absolute: 0.1 10*3/uL (ref 0.0–0.2)
Basos: 2 %
EOS (ABSOLUTE): 0.7 10*3/uL — ABNORMAL HIGH (ref 0.0–0.4)
Eos: 9 %
Hematocrit: 39.8 % (ref 34.0–46.6)
Hemoglobin: 14 g/dL (ref 11.1–15.9)
Immature Grans (Abs): 0 10*3/uL (ref 0.0–0.1)
Immature Granulocytes: 0 %
Lymphocytes Absolute: 2.5 10*3/uL (ref 0.7–3.1)
Lymphs: 33 %
MCH: 30.5 pg (ref 26.6–33.0)
MCHC: 35.2 g/dL (ref 31.5–35.7)
MCV: 87 fL (ref 79–97)
Monocytes Absolute: 0.8 10*3/uL (ref 0.1–0.9)
Monocytes: 10 %
Neutrophils Absolute: 3.5 10*3/uL (ref 1.4–7.0)
Neutrophils: 46 %
Platelets: 238 10*3/uL (ref 150–450)
RBC: 4.59 x10E6/uL (ref 3.77–5.28)
RDW: 13 % (ref 11.7–15.4)
WBC: 7.6 10*3/uL (ref 3.4–10.8)

## 2022-06-25 LAB — IRON,TIBC AND FERRITIN PANEL
Ferritin: 76 ng/mL (ref 15–150)
Iron Saturation: 21 % (ref 15–55)
Iron: 58 ug/dL (ref 27–139)
Total Iron Binding Capacity: 273 ug/dL (ref 250–450)
UIBC: 215 ug/dL (ref 118–369)

## 2022-06-25 LAB — COMP. METABOLIC PANEL (12)
AST: 17 IU/L (ref 0–40)
Albumin/Globulin Ratio: 2.1 (ref 1.2–2.2)
Albumin: 4.2 g/dL (ref 3.9–4.9)
Alkaline Phosphatase: 75 IU/L (ref 44–121)
BUN/Creatinine Ratio: 19 (ref 12–28)
BUN: 19 mg/dL (ref 8–27)
Bilirubin Total: 0.3 mg/dL (ref 0.0–1.2)
Calcium: 10.3 mg/dL (ref 8.7–10.3)
Chloride: 106 mmol/L (ref 96–106)
Creatinine, Ser: 1.02 mg/dL — ABNORMAL HIGH (ref 0.57–1.00)
Globulin, Total: 2 g/dL (ref 1.5–4.5)
Glucose: 86 mg/dL (ref 70–99)
Potassium: 3.7 mmol/L (ref 3.5–5.2)
Sodium: 140 mmol/L (ref 134–144)
Total Protein: 6.2 g/dL (ref 6.0–8.5)
eGFR: 63 mL/min/{1.73_m2} (ref >59)

## 2022-06-25 LAB — TSH: TSH: 4.17 u[IU]/mL (ref 0.450–4.500)

## 2022-06-27 ENCOUNTER — Ambulatory Visit: Payer: Self-pay | Admitting: Internal Medicine

## 2022-07-10 ENCOUNTER — Other Ambulatory Visit: Payer: Self-pay

## 2022-07-12 ENCOUNTER — Other Ambulatory Visit: Payer: Self-pay

## 2022-07-23 ENCOUNTER — Ambulatory Visit: Payer: Self-pay | Admitting: Internal Medicine

## 2022-07-29 ENCOUNTER — Ambulatory Visit: Payer: Self-pay | Attending: Internal Medicine | Admitting: Internal Medicine

## 2022-08-12 ENCOUNTER — Ambulatory Visit (INDEPENDENT_AMBULATORY_CARE_PROVIDER_SITE_OTHER): Payer: 59

## 2022-08-12 ENCOUNTER — Ambulatory Visit: Payer: 59 | Attending: Internal Medicine | Admitting: Internal Medicine

## 2022-08-12 VITALS — HR 76 | Ht 60.0 in | Wt 96.8 lb

## 2022-08-12 DIAGNOSIS — I1A0 Resistant hypertension: Secondary | ICD-10-CM

## 2022-08-12 DIAGNOSIS — R002 Palpitations: Secondary | ICD-10-CM

## 2022-08-12 NOTE — Progress Notes (Signed)
Cardiology Office Note:    Date:  08/12/2022   ID:  Lori Bush, DOB October 16, 1960, MRN [id number]  PCP:  Ladell Pier, MD   [place] Cardiologist:  Janina Mayo, MD     Referring MD: Kennieth Rad, PA-C   No chief complaint on file. Palpitations  History of Present Illness:    Lori Bush is a 62 y.o. female with a hx of arthritis, HTN, referral for palpitations during church. She notes them at night. Drinks 1 cup coffee per day. Decaf does not help. No syncope. TSH is normal. K 3.7 . BP is high today. Was on hydralazine 75 mg TID, was told to take 50 mg TID.  Thyroid function is normal   ECG in August 2023, poor baseline, see P waves.  Past Medical History:  Diagnosis Date   Arthritis Dx 2012   Hyperlipidemia    Hypertension Dx 2005   Seasonal allergies     Past Surgical History:  Procedure Laterality Date   CESAREAN SECTION     COLONOSCOPY     COLONOSCOPY WITH ESOPHAGOGASTRODUODENOSCOPY (EGD)  03/2007   HERNIA REPAIR  1971   as a child, umbilical   WISDOM TOOTH EXTRACTION      Current Medications: Current Outpatient Medications on File Prior to Visit  Medication Sig Dispense Refill   albuterol (VENTOLIN HFA) 108 (90 Base) MCG/ACT inhaler Inhale 2 puffs into the lungs every 4 (four) hours as needed for wheezing or shortness of breath. 18 g 2   amLODipine (NORVASC) 10 MG tablet Take 1 tablet (10 mg total) by mouth daily. Mush have office visit for refills 30 tablet 6   amLODipine (NORVASC) 10 MG tablet Take 1 tablet (10 mg total) by mouth once daily. Mush have office visit for refills 90 tablet 3   atorvastatin (LIPITOR) 10 MG tablet Take 1 tablet (10 mg total) by mouth daily. 90 tablet 1   carvedilol (COREG) 25 MG tablet TAKE 1 TABLET (25 MG TOTAL) BY MOUTH 2 (TWO) TIMES DAILY WITH A MEAL. 180 tablet 0   fluticasone (FLONASE) 50 MCG/ACT nasal spray Place 2 sprays into both nostrils daily.     hydrALAZINE (APRESOLINE) 50  MG tablet Take 1.5 tablets (75 mg total) by mouth 3 (three) times daily. 135 tablet 6   ibuprofen (ADVIL) 600 MG tablet TAKE 1 TABLET EVERY 6 HOURS AS NEEDED. (Patient taking differently: Take 600 mg by mouth every 6 (six) hours as needed for headache or mild pain.) 30 tablet 0   losartan (COZAAR) 100 MG tablet Take 1 tablet (100 mg total) by mouth once daily. 90 tablet 2   omeprazole (PRILOSEC) 20 MG capsule Take 1 capsule (20 mg total) by mouth daily. 30 capsule 3   No current facility-administered medications on file prior to visit.     Allergies:   Hydrochlorothiazide   Social History   Socioeconomic History   Marital status: Single    Spouse name: Not on file   Number of children: 5   Years of education: Not on file   Highest education level: Not on file  Occupational History   Occupation: Publishing copy  Tobacco Use   Smoking status: Former    Packs/day: 0.50    Types: Cigarettes   Smokeless tobacco: Never   Tobacco comments:    states she cut back 3 cigarettes/day  Vaping Use   Vaping Use: Never used  Substance and Sexual Activity   Alcohol use: No   Drug  use: No   Sexual activity: Not Currently  Other Topics Concern   Not on file  Social History Narrative   Not on file   Social Determinants of Health   Financial Resource Strain: Not on file  Food Insecurity: Not on file  Transportation Needs: Not on file  Physical Activity: Not on file  Stress: Not on file  Social Connections: Not on file     Family History: The patient's family history includes Arthritis in her brother and sister; Dementia in her father; Heart disease in her mother; Hypertension in her brother, father, mother, and sister. There is no history of Colon cancer, Stomach cancer, Rectal cancer, Esophageal cancer, or Colon polyps.  ROS:   Please see the history of present illness.     All other systems reviewed and are negative.  EKGs/Labs/Other Studies Reviewed:    The following studies  were reviewed today:   EKG:  EKG is  ordered today.  The ekg ordered today demonstrates   08/12/2022- NSR with short PR. LVH  Recent Labs: 06/24/2022: BUN 19; Creatinine, Ser 1.02; Hemoglobin 14.0; Platelets 238; Potassium 3.7; Sodium 140; TSH 4.170   Recent Lipid Panel    Component Value Date/Time   CHOL 191 11/20/2020 1018   TRIG 58 11/20/2020 1018   HDL 66 11/20/2020 1018   CHOLHDL 2.9 11/20/2020 1018   CHOLHDL 3.0 09/30/2013 1509   VLDL 25 09/30/2013 1509   LDLCALC 114 (H) 11/20/2020 1018     Risk Assessment/Calculations:     Physical Exam:    VS:   Vitals:   08/12/22 1503  Pulse: 76  SpO2: 96%    Wt Readings from Last 3 Encounters:  08/12/22 96 lb 12.8 oz (43.9 kg)  06/24/22 98 lb 9.6 oz (44.7 kg)  12/20/21 94 lb (42.6 kg)     GEN:  Well nourished, well developed in no acute distress HEENT: Normal NECK: No JVD; No carotid bruits LYMPHATICS: No lymphadenopathy CARDIAC: RRR, no murmurs, rubs, gallops RESPIRATORY:  Clear to auscultation without rales, wheezing or rhonchi  ABDOMEN: Soft, non-tender, non-distended MUSCULOSKELETAL:  No edema; No deformity  SKIN: Warm and dry NEUROLOGIC:  Alert and oriented x 3 PSYCHIATRIC:  Normal affect   ASSESSMENT:    Palpitations: 7 day ziopatch. She does not have high risk features including syncope c/f arrhythmia , family hx of SCD, or abnormalities on her EKGs.   We discussed cutting hydration with electrolytes.   Resistant HTN:  very high BP today 180/110 mmHg. They have been elevated. Norvasc 10 mg daily, losartan 100 mg daily, and coreg 25 mg BID. Takes hydralazine 75 mg TID. Renal US.  PLAN:    In order of problems listed above:  7 day ziopatch Renal duplex BL Recommend return to hydralazine 75 mg           Medication Adjustments/Labs and Tests Ordered: Current medicines are reviewed at length with the patient today.  Concerns regarding medicines are outlined above.  Orders Placed This Encounter   Procedures   LONG TERM MONITOR (3-14 DAYS)   VAS US RENAL ARTERY DUPLEX   No orders of the defined types were placed in this encounter.   Patient Instructions  Medication Instructions:  Your physician recommends that you continue on your current medications as directed. Please refer to the Current Medication list given to you today.  *If you need a refill on your cardiac medications before your next appointment, please call your pharmacy*   Lab Work: NONE If you  have labs (blood work) drawn today and your tests are completely normal, you will receive your results only by: [place] (if you have MyChart) OR A paper copy in the mail If you have any lab test that is abnormal or we need to change your treatment, we will call you to review the results.   Testing/Procedures: Your physician has requested that you have a renal artery duplex. During this test, an ultrasound is used to evaluate blood flow to the kidneys. Allow one hour for this exam. Do not eat after midnight the day before and avoid carbonated beverages. Take your medications as you usually do.   ZIO XT- Long Term Monitor Instructions Your physician has requested you wear a ZIO patch monitor for 7 days.  This is a single patch monitor. [employer] supplies one patch monitor per enrollment. Additional stickers are not available. Please do not apply patch if you will be having a Nuclear Stress Test,  Echocardiogram, Cardiac CT, MRI, or Chest Xray during the period you would be wearing the  monitor. The patch cannot be worn during these tests. You cannot remove and re-apply the  ZIO XT patch monitor.  Your ZIO patch monitor will be mailed 3 day USPS to your address on file. It may take 3-5 days  to receive your monitor after you have been enrolled.  Once you have received your monitor, please review the enclosed instructions. Your monitor  has already been registered assigning a specific monitor serial # to you.  Billing  and Patient Assistance Program Information  We have supplied [employer] with any of your insurance information on file for billing purposes. [employer] offers a sliding scale Patient Assistance Program for patients that do not have  insurance, or whose insurance does not completely cover the cost of the ZIO monitor.  You must apply for the Patient Assistance Program to qualify for this discounted rate.  To apply, please call [employer] at [phone], select option 4, select option 2, ask to apply for  Patient Assistance Program. Theodore Demark will ask your household income, and how many people  are in your household. They will quote your out-of-pocket cost based on that information.  [employer] will also be able to set up a 67-month interest-free payment plan if needed.  Applying the monitor Shave hair from upper left chest.  Hold abrader disc by orange tab. Rub abrader in 40 strokes over the upper left chest as  indicated in your monitor instructions.  Clean area with 4 enclosed alcohol pads. Let dry.  Apply patch as indicated in monitor instructions. Patch will be placed under collarbone on left  side of chest with arrow pointing upward.  Rub patch adhesive wings for 2 minutes. Remove white label marked "1". Remove the white  label marked "2". Rub patch adhesive wings for 2 additional minutes.  While looking in a mirror, press and release button in center of patch. A small green light will  flash 3-4 times. This will be your only indicator that the monitor has been turned on.  Do not shower for the first 24 hours. You may shower after the first 24 hours.  Press the button if you feel a symptom. You will hear a small click. Record Date, Time and  Symptom in the Patient Logbook.  When you are ready to remove the patch, follow instructions on the last 2 pages of Patient  Logbook. Stick patch monitor onto the last page of Patient Logbook.  Place Patient Logbook in the blue  and white box. Use locking tab on  box and tape box closed  securely. The blue and white box has prepaid postage on it. Please place it in the mailbox as  soon as possible. Your physician should have your test results approximately 7 days after the  monitor has been mailed back to [facility].  Call [facility] at [phone] if you have questions regarding  your ZIO XT patch monitor. Call them immediately if you see an orange light blinking on your  monitor.  If your monitor falls off in less than 4 days, contact our Monitor department at [phone].  If your monitor becomes loose or falls off after 4 days call [employer] at [phone] for  suggestions on securing your monitor    Follow-Up: At [facility], you and your health needs are our priority.  As part of our continuing mission to provide you with exceptional heart care, we have created designated Provider Care Teams.  These Care Teams include your primary Cardiologist (physician) and Advanced Practice Providers (APPs -  Physician Assistants and Nurse Practitioners) who all work together to provide you with the care you need, when you need it.  We recommend signing up for the patient portal called "MyChart".  Sign up information is provided on this After Visit Summary.  MyChart is used to connect with patients for Virtual Visits (Telemedicine).  Patients are able to view lab/test results, encounter notes, upcoming appointments, etc.  Non-urgent messages can be sent to your provider as well.   To learn more about what you can do with MyChart, go to [email or web address].    Your next appointment:   As Needed   Provider:   Janina Mayo, MD     Signed, Janina Mayo, MD  08/12/2022 3:36 PM    [place]

## 2022-08-12 NOTE — Progress Notes (Unsigned)
Enrolled for [employer] to mail a ZIO XT long term holter monitor to the patients address on file.  

## 2022-08-12 NOTE — Patient Instructions (Addendum)
Medication Instructions:  Your physician recommends that you continue on your current medications as directed. Please refer to the Current Medication list given to you today.  *If you need a refill on your cardiac medications before your next appointment, please call your pharmacy*   Lab Work: NONE If you have labs (blood work) drawn today and your tests are completely normal, you will receive your results only by: Am[place] (if you have MyChart) OR A paper copy in the mail If you have any lab test that is abnormal or we need to change your treatment, we will call you to review the results.   Testing/Procedures: Your physician has requested that you have a renal artery duplex. During this test, an ultrasound is used to evaluate blood flow to the kidneys. Allow one hour for this exam. Do not eat after midnight the day before and avoid carbonated beverages. Take your medications as you usually do.   ZIO XT- Long Term Monitor Instructions Your physician has requested you wear a ZIO patch monitor for 7 days.  This is a single patch monitor. [employer] supplies one patch monitor per enrollment. Additional stickers are not available. Please do not apply patch if you will be having a Nuclear Stress Test,  Echocardiogram, Cardiac CT, MRI, or Chest Xray during the period you would be wearing the  monitor. The patch cannot be worn during these tests. You cannot remove and re-apply the  ZIO XT patch monitor.  Your ZIO patch monitor will be mailed 3 day USPS to your address on file. It may take 3-5 days  to receive your monitor after you have been enrolled.  Once you have received your monitor, please review the enclosed instructions. Your monitor  has already been registered assigning a specific monitor serial # to you.  Billing and Patient Assistance Program Information  We have supplied [employer] with any of your insurance information on file for billing purposes. [employer] offers a sliding  scale Patient Assistance Program for patients that do not have  insurance, or whose insurance does not completely cover the cost of the ZIO monitor.  You must apply for the Patient Assistance Program to qualify for this discounted rate.  To apply, please call [employer] at [phone], select option 4, select option 2, ask to apply for  Patient Assistance Program. Theodore Demark will ask your household income, and how many people  are in your household. They will quote your out-of-pocket cost based on that information.  [employer] will also be able to set up a 12-month interest-free payment plan if needed.  Applying the monitor Shave hair from upper left chest.  Hold abrader disc by orange tab. Rub abrader in 40 strokes over the upper left chest as  indicated in your monitor instructions.  Clean area with 4 enclosed alcohol pads. Let dry.  Apply patch as indicated in monitor instructions. Patch will be placed under collarbone on left  side of chest with arrow pointing upward.  Rub patch adhesive wings for 2 minutes. Remove white label marked "1". Remove the white  label marked "2". Rub patch adhesive wings for 2 additional minutes.  While looking in a mirror, press and release button in center of patch. A small green light will  flash 3-4 times. This will be your only indicator that the monitor has been turned on.  Do not shower for the first 24 hours. You may shower after the first 24 hours.  Press the button if you feel a symptom. You will  hear a small click. Record Date, Time and  Symptom in the Patient Logbook.  When you are ready to remove the patch, follow instructions on the last 2 pages of Patient  Logbook. Stick patch monitor onto the last page of Patient Logbook.  Place Patient Logbook in the blue and white box. Use locking tab on box and tape box closed  securely. The blue and white box has prepaid postage on it. Please place it in the mailbox as  soon as possible. Your physician should  have your test results approximately 7 days after the  monitor has been mailed back to [facility].  Call [place] at [phone] if you have questions regarding  your ZIO XT patch monitor. Call them immediately if you see an orange light blinking on your  monitor.  If your monitor falls off in less than 4 days, contact our Monitor department at [phone].  If your monitor becomes loose or falls off after 4 days call [employer] at [phone] for  suggestions on securing your monitor    Follow-Up: At [facility], you and your health needs are our priority.  As part of our continuing mission to provide you with exceptional heart care, we have created designated Provider Care Teams.  These Care Teams include your primary Cardiologist (physician) and Advanced Practice Providers (APPs -  Physician Assistants and Nurse Practitioners) who all work together to provide you with the care you need, when you need it.  We recommend signing up for the patient portal called "MyChart".  Sign up information is provided on this After Visit Summary.  MyChart is used to connect with patients for Virtual Visits (Telemedicine).  Patients are able to view lab/test results, encounter notes, upcoming appointments, etc.  Non-urgent messages can be sent to your provider as well.   To learn more about what you can do with MyChart, go to [email or web address].    Your next appointment:   As Needed   Provider:   Janina Mayo, MD

## 2022-08-13 NOTE — Addendum Note (Signed)
Addended by: Wonda Horner on: 08/13/2022 11:03 AM   Modules accepted: Orders

## 2022-08-16 ENCOUNTER — Other Ambulatory Visit: Payer: Self-pay

## 2022-08-18 DIAGNOSIS — R002 Palpitations: Secondary | ICD-10-CM | POA: Diagnosis not present

## 2022-08-19 DIAGNOSIS — R69 Illness, unspecified: Secondary | ICD-10-CM | POA: Diagnosis not present

## 2022-08-19 DIAGNOSIS — Z823 Family history of stroke: Secondary | ICD-10-CM | POA: Diagnosis not present

## 2022-08-19 DIAGNOSIS — M1990 Unspecified osteoarthritis, unspecified site: Secondary | ICD-10-CM | POA: Diagnosis not present

## 2022-08-19 DIAGNOSIS — Z008 Encounter for other general examination: Secondary | ICD-10-CM | POA: Diagnosis not present

## 2022-08-19 DIAGNOSIS — I700 Atherosclerosis of aorta: Secondary | ICD-10-CM | POA: Diagnosis not present

## 2022-08-19 DIAGNOSIS — J42 Unspecified chronic bronchitis: Secondary | ICD-10-CM | POA: Diagnosis not present

## 2022-08-19 DIAGNOSIS — Z8249 Family history of ischemic heart disease and other diseases of the circulatory system: Secondary | ICD-10-CM | POA: Diagnosis not present

## 2022-08-19 DIAGNOSIS — J301 Allergic rhinitis due to pollen: Secondary | ICD-10-CM | POA: Diagnosis not present

## 2022-08-19 DIAGNOSIS — I10 Essential (primary) hypertension: Secondary | ICD-10-CM | POA: Diagnosis not present

## 2022-08-19 DIAGNOSIS — K219 Gastro-esophageal reflux disease without esophagitis: Secondary | ICD-10-CM | POA: Diagnosis not present

## 2022-08-19 DIAGNOSIS — Z9181 History of falling: Secondary | ICD-10-CM | POA: Diagnosis not present

## 2022-08-21 IMAGING — DX DG CHEST 1V PORT
1 series · 1 of 1 positions shown · non-contrast
Comparison: 01/14/2018

CLINICAL DATA: Shortness of breath over the last week

EXAM:
PORTABLE CHEST 1 VIEW

[chest]
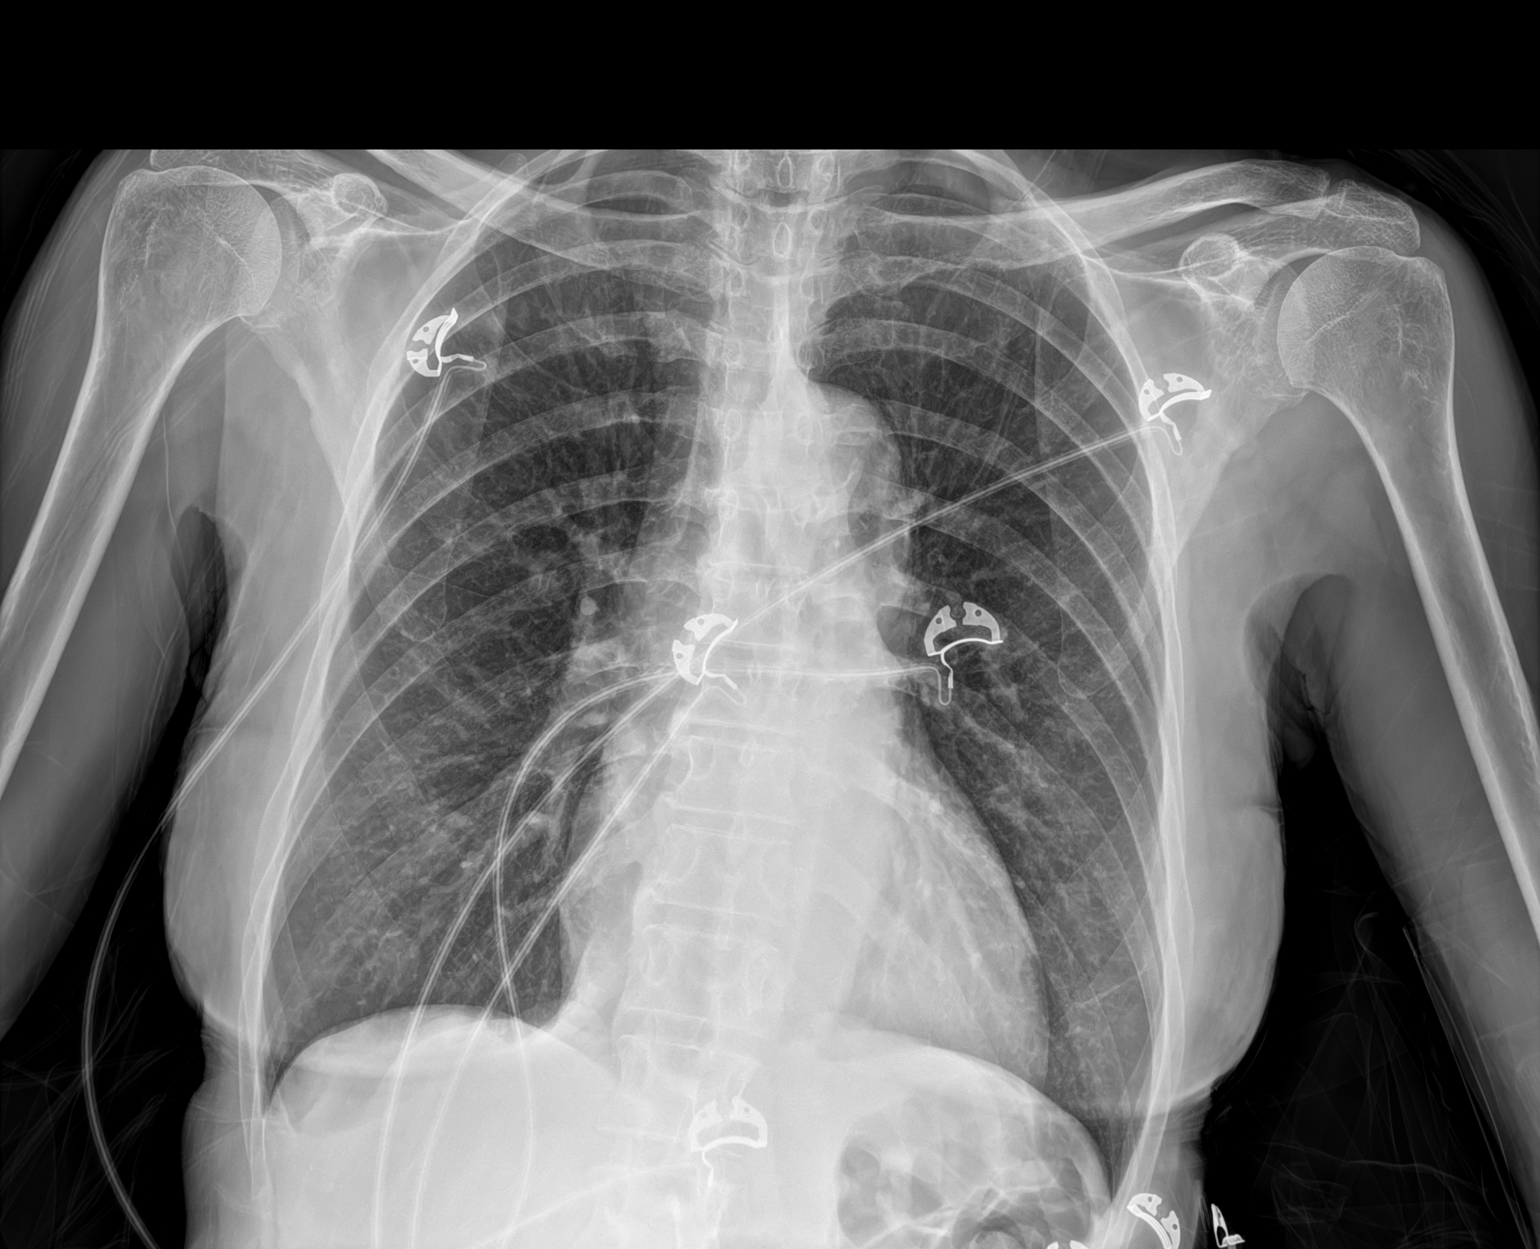

[1 of 1 positions shown; findings below may reference images not displayed]

FINDINGS: The heart size and mediastinal contours are within normal limits.
Both lungs are clear. The visualized skeletal structures are
unremarkable.
IMPRESSION: No active disease.

## 2022-08-22 ENCOUNTER — Ambulatory Visit (HOSPITAL_COMMUNITY): Admission: RE | Admit: 2022-08-22 | Payer: 59 | Source: Ambulatory Visit

## 2022-08-29 ENCOUNTER — Ambulatory Visit (HOSPITAL_COMMUNITY)
Admission: RE | Admit: 2022-08-29 | Discharge: 2022-08-29 | Disposition: A | Discharge status: Home / Self Care | Payer: 59 | Source: Ambulatory Visit | Attending: Internal Medicine | Admitting: Internal Medicine

## 2022-08-29 DIAGNOSIS — I1A0 Resistant hypertension: Secondary | ICD-10-CM | POA: Diagnosis not present

## 2022-08-30 DIAGNOSIS — R002 Palpitations: Secondary | ICD-10-CM | POA: Diagnosis not present

## 2022-09-05 ENCOUNTER — Other Ambulatory Visit: Payer: Self-pay

## 2022-09-05 ENCOUNTER — Other Ambulatory Visit: Payer: Self-pay | Admitting: Internal Medicine

## 2022-09-05 DIAGNOSIS — I10 Essential (primary) hypertension: Secondary | ICD-10-CM

## 2022-09-05 MED ORDER — CARVEDILOL 25 MG PO TABS
25.0000 mg | ORAL_TABLET | Freq: Two times a day (BID) | ORAL | 0 refills | Status: DC
  Filled 2022-09-05: qty 180, 90d supply, fill #0

## 2022-09-05 NOTE — Telephone Encounter (Signed)
Requested Prescriptions  Pending Prescriptions Disp Refills   carvedilol (COREG) 25 MG tablet 180 tablet 0    Sig: TAKE 1 TABLET (25 MG TOTAL) BY MOUTH 2 (TWO) TIMES DAILY WITH A MEAL.     Cardiovascular: Beta Blockers 3 Failed - 09/05/2022  9:50 AM      Failed - Cr in normal range and within 360 days    Creat  Date Value Ref Range Status  09/16/2017 0.84 0.50 - 1.05 mg/dL Final    Comment:    For patients >62 years of age, the reference limit for Creatinine is approximately 13% higher for people identified as African-American. .    Creatinine, Ser  Date Value Ref Range Status  06/24/2022 1.02 (H) 0.57 - 1.00 mg/dL Final         Failed - ALT in normal range and within 360 days    ALT  Date Value Ref Range Status  06/27/2021 12 0 - 44 U/L Final  12/23/2016 39 (H) 6 - 29 U/L Final         Failed - Last BP in normal range    BP Readings from Last 1 Encounters:  06/24/22 (!) 156/90         Failed - Valid encounter within last 6 months    Recent Outpatient Visits           8 months ago Essential hypertension   Brielle, MD   9 months ago Essential hypertension   Florin, MD   1 year ago Globus sensation   [place] Ladell Pier, MD   1 year ago Globus sensation   [place], MD   1 year ago Pap smear for cervical cancer screening   [place], MD       Future Appointments             In 6 days [facility] [place] - AST in normal range and within 360 days    AST  Date Value Ref Range Status  06/24/2022 17 0 - 40 IU/L Final         Passed - Last Heart Rate in normal range    Pulse Readings from Last 1 Encounters:  08/12/22 76

## 2022-09-11 ENCOUNTER — Other Ambulatory Visit: Payer: Self-pay

## 2022-09-11 ENCOUNTER — Encounter: Payer: Self-pay | Admitting: Physician Assistant

## 2022-09-11 ENCOUNTER — Ambulatory Visit: Payer: 59 | Attending: Physician Assistant | Admitting: Physician Assistant

## 2022-09-11 VITALS — BP 150/80 | HR 68 | Wt 97.6 lb

## 2022-09-11 DIAGNOSIS — K219 Gastro-esophageal reflux disease without esophagitis: Secondary | ICD-10-CM | POA: Diagnosis not present

## 2022-09-11 DIAGNOSIS — I700 Atherosclerosis of aorta: Secondary | ICD-10-CM | POA: Diagnosis not present

## 2022-09-11 DIAGNOSIS — Z1211 Encounter for screening for malignant neoplasm of colon: Secondary | ICD-10-CM | POA: Diagnosis not present

## 2022-09-11 DIAGNOSIS — I10 Essential (primary) hypertension: Secondary | ICD-10-CM | POA: Diagnosis not present

## 2022-09-11 MED ORDER — LOSARTAN POTASSIUM 100 MG PO TABS
100.0000 mg | ORAL_TABLET | Freq: Every day | ORAL | 2 refills | Status: DC
  Filled 2022-09-11: qty 90, 90d supply, fill #0
  Filled 2022-10-09 – 2022-10-17 (×2): qty 30, 30d supply, fill #0
  Filled 2022-12-05: qty 30, 30d supply, fill #1
  Filled 2023-01-15: qty 30, 30d supply, fill #2
  Filled 2023-04-07: qty 30, 30d supply, fill #3
  Filled 2023-05-22: qty 30, 30d supply, fill #4
  Filled 2023-07-22: qty 30, 30d supply, fill #5

## 2022-09-11 MED ORDER — ATORVASTATIN CALCIUM 10 MG PO TABS
10.0000 mg | ORAL_TABLET | Freq: Every day | ORAL | 1 refills | Status: DC
  Filled 2022-09-11: qty 90, 90d supply, fill #0
  Filled 2022-10-09 – 2022-10-17 (×2): qty 30, 30d supply, fill #0
  Filled 2022-12-05: qty 30, 30d supply, fill #1
  Filled 2023-01-15: qty 30, 30d supply, fill #2

## 2022-09-11 MED ORDER — CARVEDILOL 25 MG PO TABS
25.0000 mg | ORAL_TABLET | Freq: Two times a day (BID) | ORAL | 0 refills | Status: DC
  Filled 2022-09-11: qty 180, 90d supply, fill #0

## 2022-09-11 MED ORDER — HYDRALAZINE HCL 50 MG PO TABS
75.0000 mg | ORAL_TABLET | Freq: Three times a day (TID) | ORAL | 6 refills | Status: DC
  Filled 2022-09-11 – 2023-01-15 (×2): qty 135, 30d supply, fill #0
  Filled 2023-05-22: qty 135, 30d supply, fill #1

## 2022-09-11 MED ORDER — OMEPRAZOLE 20 MG PO CPDR
20.0000 mg | DELAYED_RELEASE_CAPSULE | Freq: Every day | ORAL | 3 refills | Status: DC
  Filled 2022-09-11 – 2022-10-17 (×3): qty 30, 30d supply, fill #0
  Filled 2022-12-05: qty 30, 30d supply, fill #1
  Filled 2023-01-15: qty 30, 30d supply, fill #2
  Filled 2023-04-07: qty 30, 30d supply, fill #3

## 2022-09-11 MED ORDER — AMLODIPINE BESYLATE 10 MG PO TABS
10.0000 mg | ORAL_TABLET | Freq: Every day | ORAL | 3 refills | Status: DC
  Filled 2022-09-11: qty 90, 90d supply, fill #0
  Filled 2022-11-12 – 2022-12-05 (×2): qty 30, 30d supply, fill #0
  Filled 2023-01-15: qty 30, 30d supply, fill #1

## 2022-09-11 NOTE — Progress Notes (Signed)
Patient ID: Lori Bush, female   DOB: 1961-04-25, 62 y.o.   MRN: [id number]   Lori Bush, is a 62 y.o. female  [id number]  [id number]  DOB - 1961/03/10  Chief Complaint  Patient presents with   Hypertension   Palpitations       Subjective:   Lori Bush is a 62 y.o. female here today for med RF.  Asking when due for pap and mammogram.  When she checks BP OOO numbers are 127-130s over low 80s.  No HA/CP/SOB.  Needs to have colonoscopy bc last one in 2019 and was not complete due to only partially being cleaned out.     No problems updated.  ALLERGIES: Allergies  Allergen Reactions   Hydrochlorothiazide     Causes low Potassium and high calcium.    PAST MEDICAL HISTORY: Past Medical History:  Diagnosis Date   Arthritis Dx 2012   Hyperlipidemia    Hypertension Dx 2005   Seasonal allergies     MEDICATIONS AT HOME: Prior to Admission medications   Medication Sig Start Date End Date Taking? Authorizing Provider  albuterol (VENTOLIN HFA) 108 (90 Base) MCG/ACT inhaler Inhale 2 puffs into the lungs every 4 (four) hours as needed for wheezing or shortness of breath. 06/07/21  Yes Ladell Pier, MD  fluticasone (FLONASE) 50 MCG/ACT nasal spray Place 2 sprays into both nostrils daily.   Yes [provider]  ibuprofen (ADVIL) 600 MG tablet TAKE 1 TABLET EVERY 6 HOURS AS NEEDED. Patient taking differently: Take 600 mg by mouth every 6 (six) hours as needed for headache or mild pain. 12/27/20  Yes   amLODipine (NORVASC) 10 MG tablet Take 1 tablet (10 mg total) by mouth daily. 09/11/22   Argentina Donovan, PA-C  atorvastatin (LIPITOR) 10 MG tablet Take 1 tablet (10 mg total) by mouth daily. 09/11/22   Argentina Donovan, PA-C  carvedilol (COREG) 25 MG tablet Take 1 tablet (25 mg total) by mouth 2 (two) times daily with a meal. 09/11/22   Laresha Bacorn, Dionne Bucy, PA-C  hydrALAZINE (APRESOLINE) 50 MG tablet Take 1.5 tablets (75 mg total) by mouth 3 (three)  times daily. 09/11/22   Argentina Donovan, PA-C  losartan (COZAAR) 100 MG tablet Take 1 tablet (100 mg total) by mouth once daily. 09/11/22   Argentina Donovan, PA-C  omeprazole (PRILOSEC) 20 MG capsule Take 1 capsule (20 mg total) by mouth daily. 09/11/22   Eliana Lueth, Dionne Bucy, PA-C    ROS: Neg HEENT Neg resp Neg cardiac Neg GI Neg GU Neg MS Neg psych Neg neuro  Objective:   Vitals:   09/11/22 1404 09/11/22 1420  BP: (!) 163/95 (!) 150/80  Pulse: 68   SpO2: 96%   Weight: 97 lb 9.6 oz (44.3 kg)    Exam General appearance : Awake, alert, not in any distress. Speech Clear. Not toxic looking HEENT: Atraumatic and Normocephalic Neck: Supple, no JVD. No cervical lymphadenopathy.  Chest: Good air entry bilaterally, CTAB.  No rales/rhonchi/wheezing CVS: S1 S2 regular, no murmurs.  Extremities: B/L Lower Ext shows no edema, both legs are warm to touch Neurology: Awake alert, and oriented X 3, CN II-XII intact, Non focal Skin: No Rash  Data Review Lab Results  Component Value Date   HGBA1C 5.70 08/01/2015   HGBA1C 5.2 09/30/2013    Assessment & Plan   1. Essential hypertension Controlled out of office.  Reviewed labs from 06/2022 - hydrALAZINE (APRESOLINE) 50 MG tablet; Take 1.5 tablets (75 mg total)  by mouth 3 (three) times daily.  Dispense: 135 tablet; Refill: 6 - amLODipine (NORVASC) 10 MG tablet; Take 1 tablet (10 mg total) by mouth daily.  Dispense: 90 tablet; Refill: 3 - losartan (COZAAR) 100 MG tablet; Take 1 tablet (100 mg total) by mouth once daily.  Dispense: 90 tablet; Refill: 2 - carvedilol (COREG) 25 MG tablet; Take 1 tablet (25 mg total) by mouth 2 (two) times daily with a meal.  Dispense: 180 tablet; Refill: 0  2. Gastroesophageal reflux disease without esophagitis - omeprazole (PRILOSEC) 20 MG capsule; Take 1 capsule (20 mg total) by mouth daily.  Dispense: 30 capsule; Refill: 3  3. Colon cancer screening - Ambulatory referral to Gastroenterology  4. Aortic  atherosclerosis (HCC - atorvastatin (LIPITOR) 10 MG tablet; Take 1 tablet (10 mg total) by mouth daily.  Dispense: 90 tablet; Refill: 1    Return in about 6 months (around 03/14/2023) for PCP for chronic conditions.  The patient was given clear instructions to go to ER or return to medical center if symptoms don't improve, worsen or new problems develop. The patient verbalized understanding. The patient was told to call to get lab results if they haven't heard anything in the next week.      Freeman Caldron, PA-C [facility] [place], [place]   09/11/2022, 2:25 PM

## 2022-09-11 NOTE — Patient Instructions (Signed)
Goal blood pressure <135/85.  Sit still and quiet for 5 mons before you check your pressure.  Drink 64-80 ounces water daily

## 2022-09-12 ENCOUNTER — Telehealth: Payer: Self-pay | Admitting: Gastroenterology

## 2022-09-12 ENCOUNTER — Encounter: Payer: Self-pay | Admitting: Gastroenterology

## 2022-09-12 NOTE — Telephone Encounter (Signed)
Pt had a poor prep on colon in 2019, CT scan of CAP was ordered. On the result note from CT in 2019 Dr. Loletha Carrow wanted her to schedule repeat colon at that time with a 2 day prep. Pt declined to schedule at that time. She is due for colon now and needs 2 day prep.

## 2022-09-12 NOTE — Telephone Encounter (Signed)
We received a referral for a colonoscopy.  Last colon was in 2019.  Patient is wanting to know when she is due for a recall colon?  Please advise Thanks

## 2022-09-12 NOTE — Telephone Encounter (Signed)
Pt is scheduled and notified.  Mailed paperwork

## 2022-09-17 ENCOUNTER — Other Ambulatory Visit: Payer: Self-pay

## 2022-10-01 ENCOUNTER — Ambulatory Visit (AMBULATORY_SURGERY_CENTER): Payer: Self-pay | Admitting: *Deleted

## 2022-10-01 VITALS — Ht 60.0 in | Wt 98.0 lb

## 2022-10-01 DIAGNOSIS — Z1211 Encounter for screening for malignant neoplasm of colon: Secondary | ICD-10-CM

## 2022-10-01 MED ORDER — NA SULFATE-K SULFATE-MG SULF 17.5-3.13-1.6 GM/177ML PO SOLN: 1.0000 | Freq: Once | ORAL | 0 refills | Status: AC

## 2022-10-01 NOTE — Progress Notes (Signed)
Pt's previsit is done over the phone and all paperwork (prep instructions) sent to patient. Pt's name and DOB verified at the beginning of the previsit. Pt denies any difficulty with ambulating.  No egg or soy allergy known to patient  No issues known to pt with past sedation with any surgeries or procedures Patient denies ever being intubated No FH of Malignant Hyperthermia Pt is not on diet pills Pt is not on  home 02  Pt is not on blood thinners  Pt denies issues with constipation  Pt is not on dialysis Pt denies any upcoming cardiac testing Pt encouraged to use to use Singlecare or Goodrx to reduce cost  Patient's chart reviewed by Osvaldo Angst CNRA prior to previsit and patient appropriate for the [place].  Previsit completed and red dot placed by patient's name on their procedure day (on provider's schedule).  . Visit by phone Pt states her weight is  Instructions reviewed with pt and pt states understanding. Instructed to review again prior to procedure. Pt states they will.  Instructions sent by mail with coupon and by my chart

## 2022-10-09 ENCOUNTER — Other Ambulatory Visit: Payer: Self-pay

## 2022-10-16 ENCOUNTER — Other Ambulatory Visit: Payer: Self-pay

## 2022-10-17 ENCOUNTER — Other Ambulatory Visit: Payer: Self-pay

## 2022-10-29 ENCOUNTER — Encounter: Payer: Self-pay | Admitting: Gastroenterology

## 2022-11-12 ENCOUNTER — Other Ambulatory Visit: Payer: Self-pay

## 2022-11-18 ENCOUNTER — Other Ambulatory Visit: Payer: Self-pay

## 2022-12-05 ENCOUNTER — Other Ambulatory Visit: Payer: Self-pay

## 2022-12-09 ENCOUNTER — Other Ambulatory Visit: Payer: Self-pay

## 2022-12-09 ENCOUNTER — Other Ambulatory Visit: Payer: Self-pay | Admitting: Internal Medicine

## 2022-12-10 ENCOUNTER — Other Ambulatory Visit: Payer: Self-pay

## 2022-12-10 ENCOUNTER — Encounter: Payer: Self-pay | Admitting: Physician Assistant

## 2022-12-10 ENCOUNTER — Ambulatory Visit: Payer: 59 | Admitting: Physician Assistant

## 2022-12-10 VITALS — BP 113/74 | HR 82 | Temp 98.9°F | Ht 60.0 in | Wt 92.0 lb

## 2022-12-10 DIAGNOSIS — F17210 Nicotine dependence, cigarettes, uncomplicated: Secondary | ICD-10-CM | POA: Diagnosis not present

## 2022-12-10 DIAGNOSIS — J0110 Acute frontal sinusitis, unspecified: Secondary | ICD-10-CM | POA: Diagnosis not present

## 2022-12-10 MED ORDER — AZITHROMYCIN 250 MG PO TABS
ORAL_TABLET | ORAL | 0 refills | Status: AC
  Filled 2022-12-10: qty 6, 5d supply, fill #0

## 2022-12-10 MED ORDER — FLUTICASONE PROPIONATE 50 MCG/ACT NA SUSP
2.0000 | Freq: Every day | NASAL | 3 refills | Status: DC
  Filled 2022-12-10: qty 16, 30d supply, fill #0
  Filled 2023-07-25: qty 16, 30d supply, fill #1
  Filled 2023-09-17: qty 16, 30d supply, fill #2

## 2022-12-10 MED ORDER — CETIRIZINE HCL 10 MG PO TABS
10.0000 mg | ORAL_TABLET | Freq: Every day | ORAL | 11 refills | Status: DC
  Filled 2022-12-10 – 2023-09-17 (×2): qty 30, 30d supply, fill #0
  Filled 2023-11-04: qty 30, 30d supply, fill #1

## 2022-12-10 NOTE — Progress Notes (Signed)
Established Patient Office Visit  Subjective   Patient ID: Lori Bush, female    DOB: 05/02/1961  Age: 62 y.o. MRN: [id number]  Chief Complaint  Patient presents with   Cough    X1 week, with headaches, body aches, and chest congestion     States that she has been experiencing yellow nasal discharge, sinus pain and pressure, body aches and slight cough for the past week.  States that she feels like she is getting worse.  Eating and drinking okay, but endorses low appetite  States that she is taking zyrtec D and using a netti pot without much relief.   Negative home covid test     Past Medical History:  Diagnosis Date   Allergy    Arthritis Dx 2012   Asthma    Hyperlipidemia    Hypertension Dx 2005   Seasonal allergies    Social History   Socioeconomic History   Marital status: Single    Spouse name: Not on file   Number of children: 5   Years of education: Not on file   Highest education level: Not on file  Occupational History   Occupation: Technical brewer  Tobacco Use   Smoking status: Every Day    Packs/day: .25    Types: Cigarettes   Smokeless tobacco: Never   Tobacco comments:    states she cut back 3 cigarettes/day  Vaping Use   Vaping Use: Never used  Substance and Sexual Activity   Alcohol use: No   Drug use: No   Sexual activity: Not Currently  Other Topics Concern   Not on file  Social History Narrative   Not on file   Social Determinants of Health   Financial Resource Strain: Not on file  Food Insecurity: Not on file  Transportation Needs: Not on file  Physical Activity: Not on file  Stress: Not on file  Social Connections: Not on file  Intimate Partner Violence: Not on file   Family History  Problem Relation Age of Onset   Hypertension Mother    Heart disease Mother    Hypertension Father    Dementia Father    Arthritis Sister    Hypertension Sister    Arthritis Brother    Hypertension Brother    Colon cancer Neg  Hx    Stomach cancer Neg Hx    Rectal cancer Neg Hx    Esophageal cancer Neg Hx    Colon polyps Neg Hx    Allergies  Allergen Reactions   Hydrochlorothiazide     Causes low Potassium and high calcium.    Review of Systems  Constitutional:  Negative for chills and fever.  HENT:  Positive for congestion and sinus pain. Negative for sore throat.   Eyes: Negative.   Respiratory:  Positive for cough. Negative for shortness of breath.   Cardiovascular:  Negative for chest pain.  Gastrointestinal:  Negative for nausea and vomiting.  Genitourinary: Negative.   Musculoskeletal:  Positive for myalgias.  Skin: Negative.   Neurological: Negative.   Endo/Heme/Allergies: Negative.   Psychiatric/Behavioral: Negative.        Objective:     BP 113/74 (BP Location: Left Arm, Patient Position: Sitting, Cuff Size: Normal)   Pulse 82   Temp 98.9 F (37.2 C)   Ht 5' (1.524 m)   Wt 92 lb (41.7 kg)   SpO2 97%   BMI 17.97 kg/m    Physical Exam Vitals and nursing note reviewed.  Constitutional:  Appearance: Normal appearance.  HENT:     Head: Normocephalic and atraumatic.     Salivary Glands: Right salivary gland is not diffusely enlarged or tender. Left salivary gland is not diffusely enlarged or tender.     Right Ear: Tympanic membrane, ear canal and external ear normal.     Left Ear: Tympanic membrane, ear canal and external ear normal.     Nose:     Right Turbinates: Enlarged and swollen.     Left Turbinates: Enlarged and swollen.     Right Sinus: Maxillary sinus tenderness and frontal sinus tenderness present.     Left Sinus: Maxillary sinus tenderness and frontal sinus tenderness present.     Mouth/Throat:     Lips: Pink.     Mouth: Mucous membranes are moist.     Pharynx: Oropharynx is clear.  Cardiovascular:     Rate and Rhythm: Normal rate and regular rhythm.     Pulses: Normal pulses.     Heart sounds: Normal heart sounds.  Pulmonary:     Effort: Pulmonary effort  is normal.     Breath sounds: Normal breath sounds.  Musculoskeletal:        General: Normal range of motion.     Cervical back: Normal range of motion and neck supple.  Skin:    General: Skin is warm and dry.  Neurological:     General: No focal deficit present.     Mental Status: She is alert.  Psychiatric:        Mood and Affect: Mood normal.        Behavior: Behavior normal.        Thought Content: Thought content normal.        Judgment: Judgment normal.         Assessment & Plan:   Problem List Items Addressed This Visit   None Visit Diagnoses     Acute frontal sinusitis, recurrence not specified    -  Primary   Relevant Medications   azithromycin (ZITHROMAX) 250 MG tablet   cetirizine (ZYRTEC ALLERGY) 10 MG tablet   fluticasone (FLONASE) 50 MCG/ACT nasal spray     1. Acute frontal sinusitis, recurrence not specified Trial azithromycin, zyrtec, flonase.  Patient education given on supportive care.  Red flags given on supportive care. - azithromycin (ZITHROMAX) 250 MG tablet; Take 2 tablets (500 mg total) by mouth daily for 1 day, THEN 1 tablet (250 mg total) daily for 4 days.  Dispense: 6 tablet; Refill: 0 - cetirizine (ZYRTEC ALLERGY) 10 MG tablet; Take 1 tablet (10 mg total) by mouth daily.  Dispense: 30 tablet; Refill: 11 - fluticasone (FLONASE) 50 MCG/ACT nasal spray; Place 2 sprays into both nostrils daily.  Dispense: 16 g; Refill: 3   I have reviewed the patient's medical history (PMH, PSH, Social History, Family History, Medications, and allergies) , and have been updated if relevant. I spent 30 minutes reviewing chart and  face to face time with patient.     Return if symptoms worsen or fail to improve.    Kasandra Knudsen Mayers, PA-C

## 2022-12-10 NOTE — Telephone Encounter (Signed)
Requested medications are due for refill today.  Unsure  Requested medications are on the active medications list.  yes  Last refill. 06/21/2021  Future visit scheduled.   yes  Notes to clinic.  Medication is historical. Rx signed by Lutricia Feil.    Requested Prescriptions  Pending Prescriptions Disp Refills   fluticasone (FLONASE) 50 MCG/ACT nasal spray 16 g 0    Sig: Place 1 spray into both nostrils daily.     Ear, Nose, and Throat: Nasal Preparations - Corticosteroids Passed - 12/09/2022  2:33 PM      Passed - Valid encounter within last 12 months    Recent Outpatient Visits           3 months ago Colon cancer screening   [facility] [place], Marylene Land M, [place]   11 months ago Essential hypertension   [place] [facility] Marcine Matar, MD   1 year ago Essential hypertension   [place] Marcine Matar, MD   1 year ago Globus sensation   [place] [facility] Marcine Matar, MD   1 year ago Globus sensation   [place] [facility] Marcine Matar, MD       Future Appointments             In 3 months Laural Benes, Binnie Rail, MD [facility]

## 2022-12-10 NOTE — Patient Instructions (Signed)
You are going to take azithromycin as directed  I do encourage you to take zyrtec on a daily basis, even when you are feeling well.  I hope that you feel better soon, please let us know if there is anything else we can do for you  Roney Jaffe, PA-C Physician Assistant [facility] [email or web address]   Sinus Infection, Adult A sinus infection, also called sinusitis, is inflammation of your sinuses. Sinuses are hollow spaces in the bones around your face. Your sinuses are located: Around your eyes. In the middle of your forehead. Behind your nose. In your cheekbones. Mucus normally drains out of your sinuses. When your nasal tissues become inflamed or swollen, mucus can become trapped or blocked. This allows bacteria, viruses, and fungi to grow, which leads to infection. Most infections of the sinuses are caused by a virus. A sinus infection can develop quickly. It can last for up to 4 weeks (acute) or for more than 12 weeks (chronic). A sinus infection often develops after a cold. What are the causes? This condition is caused by anything that creates swelling in the sinuses or stops mucus from draining. This includes: Allergies. Asthma. Infection from bacteria or viruses. Deformities or blockages in your nose or sinuses. Abnormal growths in the nose (nasal polyps). Pollutants, such as chemicals or irritants in the air. Infection from fungi. This is rare. What increases the risk? You are more likely to develop this condition if you: Have a weak body defense system (immune system). Do a lot of swimming or diving. Overuse nasal sprays. Smoke. What are the signs or symptoms? The main symptoms of this condition are pain and a feeling of pressure around the affected sinuses. Other symptoms include: Stuffy nose or congestion that makes it difficult to breathe through your nose. Thick yellow or greenish drainage from your  nose. Tenderness, swelling, and warmth over the affected sinuses. A cough that may get worse at night. Decreased sense of smell and taste. Extra mucus that collects in the throat or the back of the nose (postnasal drip) causing a sore throat or bad breath. Tiredness (fatigue). Fever. How is this diagnosed? This condition is diagnosed based on: Your symptoms. Your medical history. A physical exam. Tests to find out if your condition is acute or chronic. This may include: Checking your nose for nasal polyps. Viewing your sinuses using a device that has a light (endoscope). Testing for allergies or bacteria. Imaging tests, such as an MRI or CT scan. In rare cases, a bone biopsy may be done to rule out more serious types of fungal sinus disease. How is this treated? Treatment for a sinus infection depends on the cause and whether your condition is chronic or acute. If caused by a virus, your symptoms should go away on their own within 10 days. You may be given medicines to relieve symptoms. They include: Medicines that shrink swollen nasal passages (decongestants). A spray that eases inflammation of the nostrils (topical intranasal corticosteroids). Rinses that help get rid of thick mucus in your nose (nasal saline washes). Medicines that treat allergies (antihistamines). Over-the-counter pain relievers. If caused by bacteria, your health care provider may recommend waiting to see if your symptoms improve. Most bacterial infections will get better without antibiotic medicine. You may be given antibiotics if you have: A severe infection. A weak immune system. If caused by narrow nasal passages or nasal polyps, surgery may be needed. Follow these instructions at home: Medicines Take, use, or apply over-the-counter  and prescription medicines only as told by your health care provider. These may include nasal sprays. If you were prescribed an antibiotic medicine, take it as told by your  health care provider. Do not stop taking the antibiotic even if you start to feel better. Hydrate and humidify  Drink enough fluid to keep your urine pale yellow. Staying hydrated will help to thin your mucus. Use a cool mist humidifier to keep the humidity level in your home above 50%. Inhale steam for 10-15 minutes, 3-4 times a day, or as told by your health care provider. You can do this in the bathroom while a hot shower is running. Limit your exposure to cool or dry air. Rest Rest as much as possible. Sleep with your head raised (elevated). Make sure you get enough sleep each night. General instructions  Apply a warm, moist washcloth to your face 3-4 times a day or as told by your health care provider. This will help with discomfort. Use nasal saline washes as often as told by your health care provider. Wash your hands often with soap and water to reduce your exposure to germs. If soap and water are not available, use hand sanitizer. Do not smoke. Avoid being around people who are smoking (secondhand smoke). Keep all follow-up visits. This is important. Contact a health care provider if: You have a fever. Your symptoms get worse. Your symptoms do not improve within 10 days. Get help right away if: You have a severe headache. You have persistent vomiting. You have severe pain or swelling around your face or eyes. You have vision problems. You develop confusion. Your neck is stiff. You have trouble breathing. These symptoms may be an emergency. Get help right away. Call 911. Do not wait to see if the symptoms will go away. Do not drive yourself to the hospital. Summary A sinus infection is soreness and inflammation of your sinuses. Sinuses are hollow spaces in the bones around your face. This condition is caused by nasal tissues that become inflamed or swollen. The swelling traps or blocks the flow of mucus. This allows bacteria, viruses, and fungi to grow, which leads to  infection. If you were prescribed an antibiotic medicine, take it as told by your health care provider. Do not stop taking the antibiotic even if you start to feel better. Keep all follow-up visits. This is important. This information is not intended to replace advice given to you by your health care provider. Make sure you discuss any questions you have with your health care provider. Document Revised: 05/29/2021 Document Reviewed: 05/29/2021 Elsevier Patient Education  2024 [employer].

## 2022-12-11 ENCOUNTER — Other Ambulatory Visit: Payer: Self-pay

## 2022-12-11 ENCOUNTER — Encounter: Payer: Self-pay | Admitting: Physician Assistant

## 2023-01-15 ENCOUNTER — Other Ambulatory Visit: Payer: Self-pay

## 2023-01-20 ENCOUNTER — Other Ambulatory Visit: Payer: Self-pay

## 2023-03-17 ENCOUNTER — Encounter: Payer: Self-pay | Admitting: Internal Medicine

## 2023-03-17 ENCOUNTER — Other Ambulatory Visit: Payer: Self-pay

## 2023-03-17 ENCOUNTER — Ambulatory Visit: Payer: Commercial Managed Care - HMO | Attending: Internal Medicine | Admitting: Internal Medicine

## 2023-03-17 VITALS — BP 187/112 | HR 79 | Temp 98.0°F | Ht 60.0 in | Wt 90.0 lb

## 2023-03-17 DIAGNOSIS — R636 Underweight: Secondary | ICD-10-CM

## 2023-03-17 DIAGNOSIS — F17210 Nicotine dependence, cigarettes, uncomplicated: Secondary | ICD-10-CM | POA: Diagnosis not present

## 2023-03-17 DIAGNOSIS — I1A0 Resistant hypertension: Secondary | ICD-10-CM

## 2023-03-17 DIAGNOSIS — Z2821 Immunization not carried out because of patient refusal: Secondary | ICD-10-CM

## 2023-03-17 DIAGNOSIS — I700 Atherosclerosis of aorta: Secondary | ICD-10-CM

## 2023-03-17 DIAGNOSIS — F17200 Nicotine dependence, unspecified, uncomplicated: Secondary | ICD-10-CM

## 2023-03-17 DIAGNOSIS — Z1231 Encounter for screening mammogram for malignant neoplasm of breast: Secondary | ICD-10-CM

## 2023-03-17 DIAGNOSIS — Z1211 Encounter for screening for malignant neoplasm of colon: Secondary | ICD-10-CM

## 2023-03-17 DIAGNOSIS — N3941 Urge incontinence: Secondary | ICD-10-CM

## 2023-03-17 DIAGNOSIS — Z122 Encounter for screening for malignant neoplasm of respiratory organs: Secondary | ICD-10-CM

## 2023-03-17 MED ORDER — ATORVASTATIN CALCIUM 10 MG PO TABS
10.0000 mg | ORAL_TABLET | Freq: Every day | ORAL | 1 refills | Status: DC
  Filled 2023-03-17: qty 30, 30d supply, fill #0
  Filled 2023-05-22: qty 30, 30d supply, fill #1
  Filled 2023-07-25: qty 30, 30d supply, fill #2
  Filled 2023-08-22: qty 30, 30d supply, fill #3
  Filled 2023-09-17: qty 30, 30d supply, fill #4
  Filled 2023-11-04: qty 30, 30d supply, fill #5

## 2023-03-17 MED ORDER — AMLODIPINE BESYLATE 10 MG PO TABS
10.0000 mg | ORAL_TABLET | Freq: Every day | ORAL | 1 refills | Status: DC
  Filled 2023-03-17: qty 30, 30d supply, fill #0
  Filled 2023-05-08 – 2023-05-23 (×2): qty 30, 30d supply, fill #1
  Filled 2023-07-25: qty 30, 30d supply, fill #2
  Filled 2023-08-18: qty 30, 30d supply, fill #3
  Filled 2023-09-17: qty 30, 30d supply, fill #4
  Filled 2023-11-04: qty 30, 30d supply, fill #5

## 2023-03-17 MED ORDER — NICOTINE 21 MG/24HR TD PT24
21.0000 mg | MEDICATED_PATCH | Freq: Every day | TRANSDERMAL | 1 refills | Status: DC
Start: 2023-03-17 — End: 2024-03-18
  Filled 2023-03-17: qty 28, 28d supply, fill #0

## 2023-03-17 MED ORDER — CARVEDILOL 25 MG PO TABS
25.0000 mg | ORAL_TABLET | Freq: Two times a day (BID) | ORAL | 0 refills | Status: DC
  Filled 2023-03-17: qty 60, 30d supply, fill #0
  Filled 2023-08-18: qty 60, 30d supply, fill #1
  Filled 2023-09-17: qty 60, 30d supply, fill #2

## 2023-03-17 MED ORDER — SOLIFENACIN SUCCINATE 5 MG PO TABS
5.0000 mg | ORAL_TABLET | Freq: Every day | ORAL | 5 refills | Status: DC
Start: 2023-03-17 — End: 2024-01-26
  Filled 2023-03-17: qty 30, 30d supply, fill #0
  Filled 2023-05-08 – 2023-05-22 (×2): qty 30, 30d supply, fill #1
  Filled 2023-07-25: qty 30, 30d supply, fill #2
  Filled 2023-08-22: qty 30, 30d supply, fill #3
  Filled 2023-09-17: qty 30, 30d supply, fill #4
  Filled 2023-11-04: qty 30, 30d supply, fill #5

## 2023-03-17 NOTE — Patient Instructions (Signed)
Please take your afternoon dose of hydralazine around 2 to 3 PM instead of 6 to 7 PM.  Check your blood pressure daily and record your readings.  We will have you follow-up with the clinical pharmacist in 1 to 2 weeks for repeat blood pressure check.  Bring your readings with you.

## 2023-03-17 NOTE — Progress Notes (Signed)
Patient ID: Lori Bush, female    DOB: Feb 05, 1961  MRN: [id number]  CC: Hypertension (Htn f/u. Med refills. Franchot Erichsen weight gain issues /Instructed to bring shingles vax record /Yes to mammogram referral)   Subjective: Lori Bush is a 62 y.o. female who presents for chronic ds management.  I last saw her 12/2021.  Her concerns today include:  Hx of HTN, hep C cured with Harvoni, tobacco dep, underweight  HTN: Reports compliance with amlodipine 10 mg daily, Cozaar 100 mg daily, hydralazine 75 mg 3 times a day and carvedilol 25 mg twice a day.  Took a.m meds already.  Has not taken afternoon dose of hydralazine as yet.  Usually takes that around 7 PM with dinner and then takes her nighttime dose at 10 PM at bedtime.  Limits salt in foods Checks BP 2x/wk.  Gives range 140s/80s.  Blood pressure was good when she saw physician assistant in June.  Had seen a cardiologist Dr. Wyline Mood back in February of this year for palpitations.  Zio patch negative.  Renal Doppler negative for RAS.  Tob dep: Had stopped smoking for 4 mths since last visit with me but restarted due to some family stress.  Currently about 1/2 pk a day.  Smoked off and on for 30 yrs.  She was up to 1 pk a day for yrs. would like to try to quit again.  Agreeable to trying the nicotine patches.  Agreeable to lung cancer screening with CT of the chest..    History of aortic atherosclerosis: Taking atorvastatin as prescribed.  Remains under-wgh. Reports good appetite - 3 meals a day plus snacks.  Reports wgh issues started when she was on Harvoni about 5 yrs ago.  Highest wgh in past few yrs 102 lbs 2022.  Was 118 lbs in 2017 Has had several nl thyroid levels check on chemistry No blood in stools.  Had c-scope 2019 but poor prep; plan was to repeat No cough/SOB. No blood in stools/black stools.   Referred to nutritionist last year and they had called her.  Never got in with them She has been quite busy over the past  6 months with various family issues.  She also has a son in his 30s who is autistic.  She had to keep her 84-month old grandson for about 3 months during that time her daughter was ill.  She is not sure whether stress is playing a role because she does not really feel stressed.  States that she has always been a person on the go.  Has problems holding urine when she has urge times several months. No Dysuria  HM: Patient declines COVID vaccine booster and flu vaccine.  Reports [facility]ing[facility] [address]. Patient Active Problem List   Diagnosis Date Noted   Palpitations 06/25/2022   History of anemia 06/25/2022   History of Helicobacter pylori infection 06/25/2022   Elevated blood pressure reading with diagnosis of hypertension 03/19/2021   Aortic atherosclerosis (HCC) 11/27/2020   Renal mass 02/27/2018   Helicobacter pylori (H. pylori) infection 02/27/2018   Lung nodule, multiple 02/27/2018   Tobacco dependence 03/17/2017   Cyst of skin 03/17/2017   Primary osteoarthritis of right knee 10/28/2016   Chronic hepatitis C without hepatic coma (HCC) 03/26/2016   Chronic right shoulder pain 01/17/2015   Hypertension 03/05/2013     Current Outpatient Medications on File Prior to Visit  Medication Sig Dispense Refill  albuterol (VENTOLIN HFA) 108 (90 Base) MCG/ACT inhaler Inhale 2 puffs into the lungs every 4 (four) hours as needed for wheezing or shortness of breath. 18 g 2   amLODipine (NORVASC) 10 MG tablet Take 1 tablet (10 mg total) by mouth daily. 90 tablet 3   atorvastatin (LIPITOR) 10 MG tablet Take 1 tablet (10 mg total) by mouth daily. 90 tablet 1   carvedilol (COREG) 25 MG tablet Take 1 tablet (25 mg total) by mouth 2 (two) times daily with a meal. 180 tablet 0   cetirizine (ZYRTEC ALLERGY) 10 MG tablet Take 1 tablet (10 mg total) by mouth daily. 30 tablet 11   fluticasone (FLONASE) 50 MCG/ACT nasal spray Place 2 sprays into both  nostrils daily. 16 g 3   hydrALAZINE (APRESOLINE) 50 MG tablet Take 1.5 tablets (75 mg total) by mouth 3 (three) times daily. 135 tablet 6   ibuprofen (ADVIL) 600 MG tablet TAKE 1 TABLET EVERY 6 HOURS AS NEEDED. 30 tablet 0   losartan (COZAAR) 100 MG tablet Take 1 tablet (100 mg total) by mouth once daily. 90 tablet 2   omeprazole (PRILOSEC) 20 MG capsule Take 1 capsule (20 mg total) by mouth daily. 30 capsule 3   No current facility-administered medications on file prior to visit.    Allergies  Allergen Reactions   Hydrochlorothiazide     Causes low Potassium and high calcium.    Social History   Socioeconomic History   Marital status: Single    Spouse name: Not on file   Number of children: 5   Years of education: Not on file   Highest education level: Not on file  Occupational History   Occupation: Technical brewer  Tobacco Use   Smoking status: Every Day    Current packs/day: 0.25    Types: Cigarettes   Smokeless tobacco: Never   Tobacco comments:    states she cut back 3 cigarettes/day  Vaping Use   Vaping status: Never Used  Substance and Sexual Activity   Alcohol use: No   Drug use: No   Sexual activity: Not Currently  Other Topics Concern   Not on file  Social History Narrative   Not on file   Social Determinants of Health   Financial Resource Strain: Not on file  Food Insecurity: Not on file  Transportation Needs: Not on file  Physical Activity: Not on file  Stress: Not on file  Social Connections: Not on file  Intimate Partner Violence: Not on file    Family History  Problem Relation Age of Onset   Hypertension Mother    Heart disease Mother    Hypertension Father    Dementia Father    Arthritis Sister    Hypertension Sister    Arthritis Brother    Hypertension Brother    Colon cancer Neg Hx    Stomach cancer Neg Hx    Rectal cancer Neg Hx    Esophageal cancer Neg Hx    Colon polyps Neg Hx     Past Surgical History:  Procedure  Laterality Date   CESAREAN SECTION     COLONOSCOPY     COLONOSCOPY WITH ESOPHAGOGASTRODUODENOSCOPY (EGD)  03/2007   HERNIA REPAIR  1971   as a child, umbilical   WISDOM TOOTH EXTRACTION      ROS: Review of Systems Negative except as stated above  PHYSICAL EXAM: BP (!) 187/112 (BP Location: Left Arm, Patient Position: Sitting, Cuff Size: Normal)   Pulse 79   Temp  98 F (36.7 C) (Oral)   Ht 5' (1.524 m)   Wt 90 lb (40.8 kg)   SpO2 99%   BMI 17.58 kg/m   Wt Readings from Last 3 Encounters:  03/17/23 90 lb (40.8 kg)  12/10/22 92 lb (41.7 kg)  10/01/22 98 lb (44.5 kg)    Physical Exam  General appearance - alert, older African-American female who appears underweight.  Patient in no distress Mental status - normal mood, behavior, speech, dress, motor activity, and thought processes Chest - clear to auscultation, no wheezes, rales or rhonchi, symmetric air entry Heart - normal rate, regular rhythm, normal S1, S2, no murmurs, rubs, clicks or gallops Abdomen: Normal bowel sounds, soft, nontender, no organomegaly. Extremities - peripheral pulses normal, no pedal edema, no clubbing or cyanosis      Latest Ref Rng & Units 06/24/2022    4:33 PM 06/27/2021    2:46 PM 11/20/2020   10:18 AM  CMP  Glucose 70 - 99 mg/dL 86  88  92   BUN 8 - 27 mg/dL 19  13  16    Creatinine 0.57 - 1.00 mg/dL 7.82  9.56  2.13   Sodium 134 - 144 mmol/L 140  142  139   Potassium 3.5 - 5.2 mmol/L 3.7  3.5  3.4   Chloride 96 - 106 mmol/L 106  106  102   CO2 22 - 32 mmol/L  25  22   Calcium 8.7 - 10.3 mg/dL 08.6  57.8  46.9   Total Protein 6.0 - 8.5 g/dL 6.2  7.1  6.9   Total Bilirubin 0.0 - 1.2 mg/dL 0.3  0.6  0.3   Alkaline Phos 44 - 121 IU/L 75  63  82   AST 0 - 40 IU/L 17  18  12    ALT 0 - 44 U/L  12  9    Lipid Panel     Component Value Date/Time   CHOL 191 11/20/2020 1018   TRIG 58 11/20/2020 1018   HDL 66 11/20/2020 1018   CHOLHDL 2.9 11/20/2020 1018   CHOLHDL 3.0 09/30/2013 1509    VLDL 25 09/30/2013 1509   LDLCALC 114 (H) 11/20/2020 1018    CBC    Component Value Date/Time   WBC 7.6 06/24/2022 1633   WBC 7.2 06/27/2021 1446   RBC 4.59 06/24/2022 1633   RBC 4.70 06/27/2021 1446   HGB 14.0 06/24/2022 1633   HCT 39.8 06/24/2022 1633   PLT 238 06/24/2022 1633   MCV 87 06/24/2022 1633   MCH 30.5 06/24/2022 1633   MCH 29.1 06/27/2021 1446   MCHC 35.2 06/24/2022 1633   MCHC 33.7 06/27/2021 1446   RDW 13.0 06/24/2022 1633   LYMPHSABS 2.5 06/24/2022 1633   MONOABS 0.6 06/27/2021 1446   EOSABS 0.7 (H) 06/24/2022 1633   BASOSABS 0.1 06/24/2022 1633    ASSESSMENT AND PLAN: 1. Resistant hypertension Not at goal.  Home blood pressure readings not at goal but much better than what we are seeing today.  She has not taken the second dose of hydralazine as yet for today.  I recommend that she takes the second dose of hydralazine between 2 to 3 PM every day and then the third dose at bedtime. Continue medications as listed above. Will have her follow-up with the clinical pharmacist in 1 to 2 weeks for repeat blood pressure check.  If blood pressure still elevated, I would increase hydralazine to 100 mg 3 times a day and refer to  cardiology hypertension clinic. - amLODipine (NORVASC) 10 MG tablet; Take 1 tablet (10 mg total) by mouth daily.  Dispense: 90 tablet; Refill: 1 - carvedilol (COREG) 25 MG tablet; Take 1 tablet (25 mg total) by mouth 2 (two) times daily with a meal.  Dispense: 180 tablet; Refill: 0  2. Tobacco dependence Advised to quit.  She would like to give a trial of quitting.  Willing to try the nicotine patches.  Will start with the 21 mg patch.  Will reassess progress on subsequent visit. - nicotine (NICODERM CQ - DOSED IN MG/24 HOURS) 21 mg/24hr patch; Place 1 patch (21 mg total) onto the skin daily.  Dispense: 28 patch; Refill: 1  3. Underweight This is chronic.  Nonetheless we will get her up-to-date with age-appropriate cancer screenings including  mammogram.  Looks like she needs another colonoscopy as her last one done in 2019 was very poor prep.  She is agreeable to seeing the nutritionist - Ambulatory referral to Gastroenterology - Amb ref to Medical Nutrition Therapy-MNT  4. Aortic atherosclerosis (HCC) - atorvastatin (LIPITOR) 10 MG tablet; Take 1 tablet (10 mg total) by mouth daily.  Dispense: 90 tablet; Refill: 1  5. Urge incontinence Discussed diagnosis with her.  Will try her with low-dose of Vesicare. - solifenacin (VESICARE) 5 MG tablet; Take 1 tablet (5 mg total) by mouth daily.  Dispense: 30 tablet; Refill: 5  6. Encounter for screening mammogram for malignant neoplasm of breast - MM Digital Screening; Future  7. Screening for lung cancer Patient agreeable to lung cancer screening with low-dose CT of the chest.  She has more than 20-pack-year history of smoking.  8. Screening for colon cancer - Ambulatory referral to Gastroenterology  9. Influenza vaccination declined   10. COVID-19 vaccination declined     Patient was given the opportunity to ask questions.  Patient verbalized understanding of the plan and was able to repeat key elements of the plan.   This documentation was completed using Paediatric nurse.  Any transcriptional errors are unintentional.  No orders of the defined types were placed in this encounter.    Requested Prescriptions   Pending Prescriptions Disp Refills   amLODipine (NORVASC) 10 MG tablet 90 tablet 1    Sig: Take 1 tablet (10 mg total) by mouth daily.   atorvastatin (LIPITOR) 10 MG tablet 90 tablet 1    Sig: Take 1 tablet (10 mg total) by mouth daily.   carvedilol (COREG) 25 MG tablet 180 tablet 0    Sig: Take 1 tablet (25 mg total) by mouth 2 (two) times daily with a meal.    No follow-ups on file.  Jonah Blue, MD, FACP

## 2023-03-18 ENCOUNTER — Other Ambulatory Visit: Payer: Self-pay

## 2023-03-18 MED ORDER — COMIRNATY 30 MCG/0.3ML IM SUSY
0.3000 mL | PREFILLED_SYRINGE | Freq: Once | INTRAMUSCULAR | 0 refills | Status: AC
  Filled 2023-03-18: qty 0.3, 1d supply, fill #0

## 2023-03-21 ENCOUNTER — Other Ambulatory Visit: Payer: Self-pay

## 2023-03-25 ENCOUNTER — Encounter: Payer: Self-pay | Admitting: Internal Medicine

## 2023-03-26 ENCOUNTER — Telehealth: Payer: Self-pay | Admitting: Internal Medicine

## 2023-03-26 NOTE — Telephone Encounter (Signed)
-----   Message from Horald Pollen Ausdall sent at 03/21/2023  2:26 PM EDT ----- Regarding: RE: Shingles She only had 1 dose administered and I added it. She fills with our pharmacy now, and I checked with downstairs just to make sure. She did not get a second dose, here or at [facility]. ----- Message ----- From: Marcine Matar, MD Sent: 03/17/2023   5:28 PM EDT To: Drucilla Chalet, RPH-CPP Subject: Shingles                                       Reports having [facility]ingles [facility] [address] [facility].

## 2023-03-26 NOTE — Telephone Encounter (Signed)
Please let patient know that I had our clinical pharmacist check to see when she had her shingles vaccine.  Apparently she has had only 1 shot and is due for the second/final dose.  She can get this at our pharmacy or [facility].  If she gets it at an outside pharmacy, please have them send [place] documentation so that we can update it in her health maintenance.

## 2023-03-30 ENCOUNTER — Telehealth: Payer: Self-pay | Admitting: Internal Medicine

## 2023-03-30 NOTE — Telephone Encounter (Signed)
Let [place] [facility] know that the CT of the chest without contrast was approved by patient's insurance.  Authorization number is [id number].  Approval date is March 25 2023 through September 21, 2023.

## 2023-04-02 NOTE — Telephone Encounter (Signed)
Called [employer] and spoke to [place]. Verlon Au confirmed that approval information has been received along with approval dates. No further assistance needed at this time.

## 2023-04-07 ENCOUNTER — Other Ambulatory Visit: Payer: Self-pay | Admitting: Internal Medicine

## 2023-04-07 ENCOUNTER — Other Ambulatory Visit: Payer: Self-pay

## 2023-04-07 DIAGNOSIS — R053 Chronic cough: Secondary | ICD-10-CM

## 2023-04-08 ENCOUNTER — Other Ambulatory Visit: Payer: Self-pay

## 2023-04-08 ENCOUNTER — Other Ambulatory Visit: Payer: Self-pay | Admitting: Pharmacist

## 2023-04-08 DIAGNOSIS — R053 Chronic cough: Secondary | ICD-10-CM

## 2023-04-08 MED ORDER — ALBUTEROL SULFATE HFA 108 (90 BASE) MCG/ACT IN AERS
2.0000 | INHALATION_SPRAY | RESPIRATORY_TRACT | 2 refills | Status: DC | PRN
  Filled 2023-04-08: qty 18, 16d supply, fill #0

## 2023-04-08 MED ORDER — ALBUTEROL SULFATE HFA 108 (90 BASE) MCG/ACT IN AERS
2.0000 | INHALATION_SPRAY | RESPIRATORY_TRACT | 2 refills | Status: DC | PRN
  Filled 2023-04-08: qty 6.7, 16d supply, fill #0

## 2023-04-08 NOTE — Telephone Encounter (Signed)
Requested Prescriptions  Pending Prescriptions Disp Refills   albuterol (VENTOLIN HFA) 108 (90 Base) MCG/ACT inhaler 18 g 2    Sig: Inhale 2 puffs into the lungs every 4 (four) hours as needed for wheezing or shortness of breath.     Pulmonology:  Beta Agonists 2 Failed - 04/07/2023  9:44 AM      Failed - Last BP in normal range    BP Readings from Last 1 Encounters:  03/17/23 (!) 187/112         Passed - Last Heart Rate in normal range    Pulse Readings from Last 1 Encounters:  03/17/23 79         Passed - Valid encounter within last 12 months    Recent Outpatient Visits           3 weeks ago Resistant hypertension   [place] [facility] Marcine Matar, MD   6 months ago Colon cancer screening   [place] [facility] [place], Marzella Schlein, [place]   1 year ago Essential hypertension   [facility] Marcine Matar, MD   1 year ago Essential hypertension   [place] [facility] Marcine Matar, MD   1 year ago Globus sensation   [place] [facility] Marcine Matar, MD       Future Appointments             In 1 week Lois Huxley, Cornelius Moras, RPH-CPP  [facility]   In 2 months Laural Benes, Binnie Rail, MD [facility]

## 2023-04-09 ENCOUNTER — Inpatient Hospital Stay: Admission: RE | Admit: 2023-04-09 | Payer: Commercial Managed Care - HMO | Source: Ambulatory Visit

## 2023-04-10 ENCOUNTER — Other Ambulatory Visit: Payer: Self-pay

## 2023-04-15 ENCOUNTER — Ambulatory Visit: Payer: Commercial Managed Care - HMO | Admitting: Pharmacist

## 2023-05-01 ENCOUNTER — Ambulatory Visit
Admission: RE | Admit: 2023-05-01 | Discharge: 2023-05-01 | Disposition: A | Discharge status: Home / Self Care | Payer: Managed Care, Other (non HMO) | Source: Ambulatory Visit | Attending: Internal Medicine | Admitting: Internal Medicine

## 2023-05-01 DIAGNOSIS — Z1231 Encounter for screening mammogram for malignant neoplasm of breast: Secondary | ICD-10-CM

## 2023-05-13 ENCOUNTER — Encounter: Payer: Self-pay | Admitting: Physician Assistant

## 2023-05-13 ENCOUNTER — Ambulatory Visit: Payer: Managed Care, Other (non HMO) | Admitting: Physician Assistant

## 2023-05-13 VITALS — BP 136/89 | HR 82 | Ht 60.0 in | Wt 92.0 lb

## 2023-05-13 DIAGNOSIS — F17210 Nicotine dependence, cigarettes, uncomplicated: Secondary | ICD-10-CM

## 2023-05-13 DIAGNOSIS — F17200 Nicotine dependence, unspecified, uncomplicated: Secondary | ICD-10-CM

## 2023-05-13 DIAGNOSIS — J4521 Mild intermittent asthma with (acute) exacerbation: Secondary | ICD-10-CM

## 2023-05-13 MED ORDER — PREDNISONE 20 MG PO TABS: ORAL_TABLET | ORAL | 0 refills | Status: AC

## 2023-05-13 MED ORDER — AZITHROMYCIN 250 MG PO TABS
ORAL_TABLET | ORAL | 0 refills | Status: DC
Start: 2023-05-13 — End: 2023-06-17

## 2023-05-13 MED ORDER — BENZONATATE 100 MG PO CAPS: ORAL_CAPSULE | ORAL | 0 refills | Status: DC

## 2023-05-13 NOTE — Progress Notes (Unsigned)
Established Patient Office Visit  Subjective   Patient ID: Lori Bush, female    DOB: 1960-10-31  Age: 62 y.o. MRN: [id number]  Chief Complaint  Patient presents with   Shortness of Breath    Pain around left rib     States that she has been having a productive cough with yellowish-greenish sputum for the past 2 days.  States that she is feeling pain in her ribs from the coughing, and experiencing shortness of breath.  Also complains of sinus and bilateral ear pressure.  States that she has had chills but no fever.    States that she has been using Robitussin, steam treatments as well as her humidifier.  States that she has been using [place] pot, states that even the [place] pot is not allowing her to be able to breathe through her nose, states that she feels very congested.  Denies sick contacts.  Is eating and drinking okay but endorses low appetite       Past Medical History:  Diagnosis Date   Allergy    Arthritis Dx 2012   Asthma    Hyperlipidemia    Hypertension Dx 2005   Seasonal allergies    Social History   Socioeconomic History   Marital status: Single    Spouse name: Not on file   Number of children: 5   Years of education: Not on file   Highest education level: Not on file  Occupational History   Occupation: Technical brewer  Tobacco Use   Smoking status: Every Day    Current packs/day: 0.25    Types: Cigarettes   Smokeless tobacco: Never   Tobacco comments:    states she cut back 3 cigarettes/day  Vaping Use   Vaping status: Never Used  Substance and Sexual Activity   Alcohol use: No   Drug use: No   Sexual activity: Not Currently  Other Topics Concern   Not on file  Social History Narrative   Not on file   Social Determinants of Health   Financial Resource Strain: Not on file  Food Insecurity: Not on file  Transportation Needs: Not on file  Physical Activity: Not on file  Stress: Not on file  Social Connections: Not on file   Intimate Partner Violence: Not on file   Family History  Problem Relation Age of Onset   Hypertension Mother    Heart disease Mother    Hypertension Father    Dementia Father    Arthritis Sister    Hypertension Sister    Arthritis Brother    Hypertension Brother    Colon cancer Neg Hx    Stomach cancer Neg Hx    Rectal cancer Neg Hx    Esophageal cancer Neg Hx    Colon polyps Neg Hx    Allergies  Allergen Reactions   Hydrochlorothiazide     Causes low Potassium and high calcium.    Review of Systems  Constitutional:  Positive for chills. Negative for fever.  HENT:  Positive for congestion and ear pain.   Eyes: Negative.   Respiratory:  Positive for cough, shortness of breath and wheezing.   Cardiovascular:  Negative for chest pain.  Gastrointestinal:  Negative for abdominal pain, nausea and vomiting.  Genitourinary: Negative.   Musculoskeletal:  Negative for myalgias.  Skin: Negative.   Neurological: Negative.   Endo/Heme/Allergies: Negative.   Psychiatric/Behavioral: Negative.        Objective:     BP 136/89 (BP Location: Left Arm, Patient  Position: Sitting, Cuff Size: Small)   Pulse 82   Ht 5' (1.524 m)   Wt 92 lb (41.7 kg)   SpO2 97%   BMI 17.97 kg/m  BP Readings from Last 3 Encounters:  05/13/23 136/89  03/17/23 (!) 187/112  12/10/22 113/74   Wt Readings from Last 3 Encounters:  05/13/23 92 lb (41.7 kg)  03/17/23 90 lb (40.8 kg)  12/10/22 92 lb (41.7 kg)    Physical Exam Vitals and nursing note reviewed.  Constitutional:      Appearance: Normal appearance.  HENT:     Head: Normocephalic and atraumatic.     Right Ear: External ear normal.     Left Ear: External ear normal.     Nose:     Right Turbinates: Enlarged and swollen.     Left Turbinates: Enlarged and swollen.     Right Sinus: No maxillary sinus tenderness or frontal sinus tenderness.     Left Sinus: No frontal sinus tenderness.     Mouth/Throat:     Mouth: Mucous membranes are  moist.  Eyes:     Extraocular Movements: Extraocular movements intact.     Conjunctiva/sclera: Conjunctivae normal.     Pupils: Pupils are equal, round, and reactive to light.  Cardiovascular:     Rate and Rhythm: Normal rate.     Pulses: Normal pulses.     Heart sounds: Normal heart sounds.  Pulmonary:     Effort: Accessory muscle usage present.     Breath sounds: Examination of the right-upper field reveals wheezing. Examination of the left-upper field reveals wheezing. Examination of the right-middle field reveals wheezing. Examination of the left-middle field reveals wheezing. Examination of the right-lower field reveals wheezing. Examination of the left-lower field reveals wheezing. Wheezing present.  Musculoskeletal:        General: Normal range of motion.     Cervical back: Normal range of motion and neck supple.  Skin:    General: Skin is warm and dry.  Neurological:     General: No focal deficit present.     Mental Status: She is alert and oriented to person, place, and time.  Psychiatric:        Mood and Affect: Mood normal.        Behavior: Behavior normal.        Thought Content: Thought content normal.        Judgment: Judgment normal.        Assessment & Plan:   Problem List Items Addressed This Visit   None Visit Diagnoses     Mild intermittent asthma with acute exacerbation    -  Primary   Relevant Medications   azithromycin (ZITHROMAX) 250 MG tablet   predniSONE (DELTASONE) 20 MG tablet   benzonatate (TESSALON) 100 MG capsule   Other Relevant Orders   DG Chest 2 View      1. Mild intermittent asthma with acute exacerbation Trial azithromycin, prednisone taper, Tessalon Perles.  Trial Flonase, patient has prescription. given smoking history, reasonable for chest x-ray.  Patient education given on supportive care, red flags given for prompt reevaluation. - DG Chest 2 View; Future - azithromycin (ZITHROMAX) 250 MG tablet; Take 2 tabs PO day 1, then  take 1 tab PO once daily  Dispense: 6 tablet; Refill: 0 - predniSONE (DELTASONE) 20 MG tablet; Take 3 tablets (60 mg total) by mouth daily with breakfast for 2 days, THEN 2 tablets (40 mg total) daily with breakfast for 2 days, THEN 1 tablet (  20 mg total) daily with breakfast for 2 days, THEN 0.5 tablets (10 mg total) daily with breakfast for 2 days.  Dispense: 13 tablet; Refill: 0 - benzonatate (TESSALON) 100 MG capsule; Take 1-2 caps PO TID PRN  Dispense: 20 capsule; Refill: 0   I have reviewed the patient's medical history (PMH, PSH, Social History, Family History, Medications, and allergies) , and have been updated if relevant. I spent 30 minutes reviewing chart and  face to face time with patient.     Return if symptoms worsen or fail to improve.    Kasandra Knudsen Mayers, PA-C

## 2023-05-13 NOTE — Patient Instructions (Signed)
You are going to take azithromycin as directed, also a steroid taper, and I sent Tessalon Perles to help you with the cough.  Continue your supportive care that you are doing right now including the Zyrtec, Flonase, hot steamy showers.  Please present to have x-ray completed, we will call you with the x-ray results when they are available.  I hope that you feel better soon, please let us know if there is anything else we can do for you  Roney Jaffe, PA-C Physician Assistant [facility] [email or web address]   Shortness of Breath, Adult Shortness of breath is when a person has trouble breathing or when a person feels like she or he is having trouble breathing in enough air. Shortness of breath could be a sign of a medical problem. Follow these instructions at home:  Pollutants Do not use any products that contain nicotine or tobacco. These products include cigarettes, chewing tobacco, and vaping devices, such as e-cigarettes. This also includes cigars and pipes. If you need help quitting, ask your health care provider. Avoid things that can irritate your airways, including: Smoke. This includes campfire smoke, forest fire smoke, and secondhand smoke from tobacco products. Do not smoke or allow others to smoke in your home. Mold. Dust. Air pollution. Chemical fumes. Things that can give you an allergic reaction (allergens) if you have allergies. Common allergens include pollen from grasses or trees and animal dander. Keep your living space clean and free of mold and dust. General instructions Pay attention to any changes in your symptoms. Take over-the-counter and prescription medicines only as told by your health care provider. This includes oxygen therapy and inhaled medicines. Rest as needed. Return to your normal activities as told by your health care provider. Ask your health care provider what activities are safe for you. Keep  all follow-up visits. This is important. Contact a health care provider if: Your condition does not improve as soon as expected. You have a hard time doing your normal activities, even after you rest. You have new symptoms. You cannot walk up stairs or exercise the way that you normally do. Get help right away if: Your shortness of breath gets worse. You have shortness of breath when you are resting. You feel light-headed or you faint. You have a cough that is not controlled with medicines. You cough up blood. You have pain with breathing. You have pain in your chest, arms, shoulders, or abdomen. You have a fever. These symptoms may be an emergency. Get help right away. Call 911. Do not wait to see if the symptoms will go away. Do not drive yourself to the hospital. Summary Shortness of breath is when a person has trouble breathing enough air. It can be a sign of a medical problem. Avoid things that irritate your lungs, such as smoking, pollution, mold, and dust. Pay attention to changes in your symptoms and contact your health care provider if you have a hard time completing daily activities because of shortness of breath. This information is not intended to replace advice given to you by your health care provider. Make sure you discuss any questions you have with your health care provider. Document Revised: 02/10/2021 Document Reviewed: 02/10/2021 Elsevier Patient Education  2024 [employer].

## 2023-05-14 ENCOUNTER — Ambulatory Visit (HOSPITAL_COMMUNITY)
Admission: RE | Admit: 2023-05-14 | Discharge: 2023-05-14 | Disposition: A | Discharge status: Home / Self Care | Payer: Managed Care, Other (non HMO) | Source: Ambulatory Visit | Attending: Physician Assistant | Admitting: Physician Assistant

## 2023-05-14 ENCOUNTER — Telehealth: Payer: Self-pay | Admitting: Internal Medicine

## 2023-05-14 DIAGNOSIS — J4521 Mild intermittent asthma with (acute) exacerbation: Secondary | ICD-10-CM | POA: Insufficient documentation

## 2023-05-14 NOTE — Telephone Encounter (Signed)
Pt called in for results of chest xray. Please cb when the notes are in

## 2023-05-14 NOTE — Telephone Encounter (Signed)
Noted  

## 2023-05-15 ENCOUNTER — Telehealth: Payer: Self-pay

## 2023-05-15 NOTE — Telephone Encounter (Signed)
Copied from CRM [id number]. Topic: General - Other >> May 15, 2023  9:51 AM Macon Large wrote: Reason for CRM: Pt requests call back to go over results of chest x-ray. Cb# [phone]

## 2023-05-16 NOTE — Telephone Encounter (Signed)
PT is calling in checking on the status of her imaging results. Please follow up with pt. She is concerned that she has pneumonia.

## 2023-05-16 NOTE — Telephone Encounter (Signed)
Called [facility] to request imaging results be released. Information given to Stacy at [facility] in the  waiting room.

## 2023-05-20 ENCOUNTER — Other Ambulatory Visit: Payer: Self-pay

## 2023-05-21 ENCOUNTER — Ambulatory Visit: Payer: Commercial Managed Care - HMO | Admitting: Registered"

## 2023-05-22 ENCOUNTER — Other Ambulatory Visit: Payer: Self-pay | Admitting: Physician Assistant

## 2023-05-22 ENCOUNTER — Other Ambulatory Visit: Payer: Self-pay

## 2023-05-22 DIAGNOSIS — K219 Gastro-esophageal reflux disease without esophagitis: Secondary | ICD-10-CM

## 2023-05-22 MED ORDER — OMEPRAZOLE 20 MG PO CPDR
20.0000 mg | DELAYED_RELEASE_CAPSULE | Freq: Every day | ORAL | 0 refills | Status: DC
  Filled 2023-05-22: qty 30, 30d supply, fill #0

## 2023-05-23 ENCOUNTER — Other Ambulatory Visit: Payer: Self-pay

## 2023-05-26 ENCOUNTER — Other Ambulatory Visit: Payer: Self-pay

## 2023-06-03 ENCOUNTER — Ambulatory Visit: Payer: Commercial Managed Care - HMO | Admitting: Registered"

## 2023-06-13 ENCOUNTER — Ambulatory Visit (HOSPITAL_COMMUNITY): Payer: Commercial Managed Care - HMO

## 2023-06-17 ENCOUNTER — Other Ambulatory Visit: Payer: Self-pay

## 2023-06-17 ENCOUNTER — Encounter: Payer: Self-pay | Admitting: Internal Medicine

## 2023-06-17 ENCOUNTER — Telehealth: Payer: Self-pay | Admitting: Internal Medicine

## 2023-06-17 ENCOUNTER — Ambulatory Visit: Payer: Commercial Managed Care - HMO | Attending: Internal Medicine | Admitting: Internal Medicine

## 2023-06-17 ENCOUNTER — Other Ambulatory Visit: Payer: Self-pay | Admitting: Internal Medicine

## 2023-06-17 VITALS — BP 134/77 | HR 77 | Temp 98.0°F | Ht 60.0 in | Wt 99.0 lb

## 2023-06-17 DIAGNOSIS — Z5986 Financial insecurity: Secondary | ICD-10-CM

## 2023-06-17 DIAGNOSIS — I700 Atherosclerosis of aorta: Secondary | ICD-10-CM

## 2023-06-17 DIAGNOSIS — J329 Chronic sinusitis, unspecified: Secondary | ICD-10-CM

## 2023-06-17 DIAGNOSIS — Z23 Encounter for immunization: Secondary | ICD-10-CM

## 2023-06-17 DIAGNOSIS — N3941 Urge incontinence: Secondary | ICD-10-CM

## 2023-06-17 DIAGNOSIS — Z5941 Food insecurity: Secondary | ICD-10-CM

## 2023-06-17 DIAGNOSIS — Z2821 Immunization not carried out because of patient refusal: Secondary | ICD-10-CM

## 2023-06-17 DIAGNOSIS — J40 Bronchitis, not specified as acute or chronic: Secondary | ICD-10-CM | POA: Diagnosis not present

## 2023-06-17 DIAGNOSIS — F17210 Nicotine dependence, cigarettes, uncomplicated: Secondary | ICD-10-CM

## 2023-06-17 DIAGNOSIS — I1A0 Resistant hypertension: Secondary | ICD-10-CM | POA: Diagnosis not present

## 2023-06-17 DIAGNOSIS — F17200 Nicotine dependence, unspecified, uncomplicated: Secondary | ICD-10-CM

## 2023-06-17 DIAGNOSIS — Z122 Encounter for screening for malignant neoplasm of respiratory organs: Secondary | ICD-10-CM

## 2023-06-17 DIAGNOSIS — R739 Hyperglycemia, unspecified: Secondary | ICD-10-CM

## 2023-06-17 DIAGNOSIS — R636 Underweight: Secondary | ICD-10-CM

## 2023-06-17 DIAGNOSIS — Z1231 Encounter for screening mammogram for malignant neoplasm of breast: Secondary | ICD-10-CM

## 2023-06-17 MED ORDER — BENZONATATE 100 MG PO CAPS
ORAL_CAPSULE | ORAL | 0 refills | Status: DC
  Filled 2023-06-17: qty 20, 10d supply, fill #0

## 2023-06-17 MED ORDER — AMOXICILLIN-POT CLAVULANATE 500-125 MG PO TABS
1.0000 | ORAL_TABLET | Freq: Two times a day (BID) | ORAL | 0 refills | Status: DC
  Filled 2023-06-17: qty 14, 7d supply, fill #0

## 2023-06-17 MED ORDER — VARENICLINE TARTRATE (STARTER) 0.5 MG X 11 & 1 MG X 42 PO TBPK
ORAL_TABLET | ORAL | 0 refills | Status: DC
  Filled 2023-06-17: qty 53, 21d supply, fill #0
  Filled 2023-08-29: qty 53, 30d supply, fill #0

## 2023-06-17 MED ORDER — ALBUTEROL SULFATE HFA 108 (90 BASE) MCG/ACT IN AERS
2.0000 | INHALATION_SPRAY | RESPIRATORY_TRACT | 2 refills | Status: DC | PRN
  Filled 2023-06-17: qty 6.7, 16d supply, fill #0
  Filled 2024-01-28: qty 6.7, 16d supply, fill #1

## 2023-06-17 NOTE — Progress Notes (Signed)
Patient ID: Lori Bush, female    DOB: 05/12/1961  MRN: [id number]  CC: Hypertension (HTN f/u. Libby Maw, congestion X3 days Doy Mince vax administered on 06/17/23 - C.A)   Subjective: Lori Bush is a 62 y.o. female who presents for chronic ds management. Her concerns today include:  Hx of HTN, hep C cured with Harvoni, tobacco dep, underweight   Discussed the use of AI scribe software for clinical note transcription with the patient, who gave verbal consent to proceed.  History of Present Illness   The patient, with a history of hypertension, presents with a persistent cough and sinus congestion that has been ongoing for over a month.  Seen for this at our mobile unit clinic about a month ago.  Despite treatment with a Z-Pak, prednisone, and cough tablets, the symptoms have not resolved.  Chest x-ray at that time revealed no acute findings but hyperinflation of the lungs.  The patient reports that the cough never fully subsided and has recently worsened with increased mucus production. The mucus is sometimes white and sometimes has a yellow tint. The patient denies any shortness of breath or fever. The patient also reports pain behind the sinuses on the side of the nose and blurry vision, which she attributes to the sinus congestion. The patient has been using Flonase and generic Zyrtec for the nasal and head congestion but reports that she is still congested.  HTN: Compliant with amlodipine, lorsatin, hydralazine, and carvedilol. The patient reports taking the medications as prescribed and limiting salt intake. The patient checks her blood pressure twice a week and reports readings of 137/80 and 146/82.   Underweight: The patient also reports a weight gain of nine pounds since the last visit.  Attributes this to eating more during Thanksgiving holiday season.  Tob dep: The patient has a history of smoking and has been unsuccessful in quitting despite using nicotine patches.  The patient expresses a desire to quit smoking and is open to trying Chantix, despite concerns about potential side effects.  Has screening CT of the chest scheduled for later this month.  Urge incontinence: Prescribed Vesicare on last visit, reports doing well with that.     Patient Active Problem List   Diagnosis Date Noted   Palpitations 06/25/2022   History of anemia 06/25/2022   History of Helicobacter pylori infection 06/25/2022   Elevated blood pressure reading with diagnosis of hypertension 03/19/2021   Aortic atherosclerosis (HCC) 11/27/2020   Renal mass 02/27/2018   Helicobacter pylori (H. pylori) infection 02/27/2018   Lung nodule, multiple 02/27/2018   Tobacco dependence 03/17/2017   Cyst of skin 03/17/2017   Primary osteoarthritis of right knee 10/28/2016   Chronic right shoulder pain 01/17/2015   Hypertension 03/05/2013     Current Outpatient Medications on File Prior to Visit  Medication Sig Dispense Refill   amLODipine (NORVASC) 10 MG tablet Take 1 tablet (10 mg total) by mouth daily. 90 tablet 1   atorvastatin (LIPITOR) 10 MG tablet Take 1 tablet (10 mg total) by mouth daily. 90 tablet 1   carvedilol (COREG) 25 MG tablet Take 1 tablet (25 mg total) by mouth 2 (two) times daily with a meal. 180 tablet 0   cetirizine (ZYRTEC ALLERGY) 10 MG tablet Take 1 tablet (10 mg total) by mouth daily. 30 tablet 11   fluticasone (FLONASE) 50 MCG/ACT nasal spray Place 2 sprays into both nostrils daily. 16 g 3   hydrALAZINE (APRESOLINE) 50 MG tablet Take 1.5 tablets (75 mg total)  by mouth 3 (three) times daily. 135 tablet 6   ibuprofen (ADVIL) 600 MG tablet TAKE 1 TABLET EVERY 6 HOURS AS NEEDED. 30 tablet 0   losartan (COZAAR) 100 MG tablet Take 1 tablet (100 mg total) by mouth once daily. 90 tablet 2   nicotine (NICODERM CQ - DOSED IN MG/24 HOURS) 21 mg/24hr patch Place 1 patch (21 mg total) onto the skin daily. 28 patch 1   omeprazole (PRILOSEC) 20 MG capsule Take 1 capsule (20 mg  total) by mouth daily. 30 capsule 0   solifenacin (VESICARE) 5 MG tablet Take 1 tablet (5 mg total) by mouth daily. 30 tablet 5   No current facility-administered medications on file prior to visit.    Allergies  Allergen Reactions   Hydrochlorothiazide     Causes low Potassium and high calcium.    Social History   Socioeconomic History   Marital status: Single    Spouse name: Not on file   Number of children: 5   Years of education: Not on file   Highest education level: Not on file  Occupational History   Occupation: Technical brewer  Tobacco Use   Smoking status: Every Day    Current packs/day: 0.25    Types: Cigarettes   Smokeless tobacco: Never   Tobacco comments:    states she cut back 3 cigarettes/day  Vaping Use   Vaping status: Never Used  Substance and Sexual Activity   Alcohol use: No   Drug use: No   Sexual activity: Not Currently  Other Topics Concern   Not on file  Social History Narrative   Not on file   Social Determinants of Health   Financial Resource Strain: High Risk (06/17/2023)   Overall Financial Resource Strain (CARDIA)    Difficulty of Paying Living Expenses: Hard  Food Insecurity: Food Insecurity Present (06/17/2023)   Hunger Vital Sign    Worried About Running Out of Food in the Last Year: Sometimes true    Ran Out of Food in the Last Year: Sometimes true  Transportation Needs: No Transportation Needs (06/17/2023)   PRAPARE - Administrator, Civil Service (Medical): No    Lack of Transportation (Non-Medical): No  Physical Activity: Insufficiently Active (06/17/2023)   Exercise Vital Sign    Days of Exercise per Week: 2 days    Minutes of Exercise per Session: 20 min  Stress: No Stress Concern Present (06/17/2023)   [employer] of Occupational Health - Occupational Stress Questionnaire    Feeling of Stress : Not at all  Social Connections: Moderately Integrated (06/17/2023)   Social Connection and Isolation Panel  [NHANES]    Frequency of Communication with Friends and Family: Three times a week    Frequency of Social Gatherings with Friends and Family: Twice a week    Attends Religious Services: More than 4 times per year    Active Member of [employer] or Organizations: Yes    Attends Engineer, structural: More than 4 times per year    Marital Status: Never married  Intimate Partner Violence: Not At Risk (06/17/2023)   Humiliation, Afraid, Rape, and Kick questionnaire    Fear of Current or Ex-Partner: No    Emotionally Abused: No    Physically Abused: No    Sexually Abused: No    Family History  Problem Relation Age of Onset   Hypertension Mother    Heart disease Mother    Hypertension Father    Dementia Father  Arthritis Sister    Hypertension Sister    Arthritis Brother    Hypertension Brother    Colon cancer Neg Hx    Stomach cancer Neg Hx    Rectal cancer Neg Hx    Esophageal cancer Neg Hx    Colon polyps Neg Hx     Past Surgical History:  Procedure Laterality Date   CESAREAN SECTION     COLONOSCOPY     COLONOSCOPY WITH ESOPHAGOGASTRODUODENOSCOPY (EGD)  03/2007   HERNIA REPAIR  1971   as a child, umbilical   WISDOM TOOTH EXTRACTION      ROS: Review of Systems Negative except as stated above  PHYSICAL EXAM: BP 134/77   Pulse 77   Temp 98 F (36.7 C) (Oral)   Ht 5' (1.524 m)   Wt 99 lb (44.9 kg)   SpO2 90%   BMI 19.33 kg/m   Wt Readings from Last 3 Encounters:  06/17/23 99 lb (44.9 kg)  05/13/23 92 lb (41.7 kg)  03/17/23 90 lb (40.8 kg)    Physical Exam  General appearance - alert, well appearing, older African-American female and in no distress.  Mild to moderate audible congestion Mental status - normal mood, behavior, speech, dress, motor activity, and thought processes Nose -severe enlargement with nasal 2 minutes blocking the nasal passage. Mouth - mucous membranes moist, pharynx normal without lesions Neck - supple, no significant  adenopathy Chest -few scattered wheezes but clear otherwise Heart - normal rate, regular rhythm, normal S1, S2, no murmurs, rubs, clicks or gallops Extremities - peripheral pulses normal, no pedal edema, no clubbing or cyanosis      Latest Ref Rng & Units 06/24/2022    4:33 PM 06/27/2021    2:46 PM 11/20/2020   10:18 AM  CMP  Glucose 70 - 99 mg/dL 86  88  92   BUN 8 - 27 mg/dL 19  13  16    Creatinine 0.57 - 1.00 mg/dL 6.21  3.08  6.57   Sodium 134 - 144 mmol/L 140  142  139   Potassium 3.5 - 5.2 mmol/L 3.7  3.5  3.4   Chloride 96 - 106 mmol/L 106  106  102   CO2 22 - 32 mmol/L  25  22   Calcium 8.7 - 10.3 mg/dL 84.6  96.2  95.2   Total Protein 6.0 - 8.5 g/dL 6.2  7.1  6.9   Total Bilirubin 0.0 - 1.2 mg/dL 0.3  0.6  0.3   Alkaline Phos 44 - 121 IU/L 75  63  82   AST 0 - 40 IU/L 17  18  12    ALT 0 - 44 U/L  12  9    Lipid Panel     Component Value Date/Time   CHOL 191 11/20/2020 1018   TRIG 58 11/20/2020 1018   HDL 66 11/20/2020 1018   CHOLHDL 2.9 11/20/2020 1018   CHOLHDL 3.0 09/30/2013 1509   VLDL 25 09/30/2013 1509   LDLCALC 114 (H) 11/20/2020 1018    CBC    Component Value Date/Time   WBC 7.6 06/24/2022 1633   WBC 7.2 06/27/2021 1446   RBC 4.59 06/24/2022 1633   RBC 4.70 06/27/2021 1446   HGB 14.0 06/24/2022 1633   HCT 39.8 06/24/2022 1633   PLT 238 06/24/2022 1633   MCV 87 06/24/2022 1633   MCH 30.5 06/24/2022 1633   MCH 29.1 06/27/2021 1446   MCHC 35.2 06/24/2022 1633   MCHC 33.7 06/27/2021 1446  RDW 13.0 06/24/2022 1633   LYMPHSABS 2.5 06/24/2022 1633   MONOABS 0.6 06/27/2021 1446   EOSABS 0.7 (H) 06/24/2022 1633   BASOSABS 0.1 06/24/2022 1633    ASSESSMENT AND PLAN: 1. Resistant hypertension Repeat blood pressure today closer to goal.  She will continue Norvasc 10 mg, Cozaar 100 mg daily, hydralazine 75 mg 3 times a day and Cozaar 25 mg twice a day. - CBC - Comprehensive metabolic panel - Lipid panel  2. Recurrent sinusitis For congestion, I  recommend using some Vicks vapor rub.  She can also purchase Coricidin HBP over-the-counter which will help with the congestion and the cough. - amoxicillin-clavulanate (AUGMENTIN) 500-125 MG tablet; Take 1 tablet by mouth 2 (two) times daily.  Dispense: 14 tablet; Refill: 0  3. Bronchitis Chest X-ray showed overinflation suggestive of COPD. -Start Albuterol inhaler as needed for cough. -Prescribe Augmentin for possible sinusitis given sinus pain and mucus production. - albuterol (VENTOLIN HFA) 108 (90 Base) MCG/ACT inhaler; Inhale 2 puffs into the lungs every 4 (four) hours as needed for wheezing or shortness of breath. And cough  Dispense: 6.7 g; Refill: 2 - benzonatate (TESSALON) 100 MG capsule; Take 1-2 capsule by mouth three times daily as needed  Dispense: 20 capsule; Refill: 0  4. Tobacco dependence Patient wanting to quit.  She is willing to try Chantix.  Advised that the medication can cause mood swings and bad dreams.  Begin with the starter pack for 1 month.  Once she has completed the starter pack, she will let me know and we will send in for the continuation pack for her to use for 2 months. - Varenicline Tartrate, Starter, (CHANTIX STARTING MONTH PAK) 0.5 MG X 11 & 1 MG X 42 TBPK; Take 0.5 mg by mouth daily for day 1 to 3, then 0.5 mg two times a day for 4 to 7 days then 1 tablet two times daily.  Dispense: 53 each; Refill: 0  5. Urge incontinence Improved on Vesicare  6. Encounter for immunization Second Shingrix vaccine given today. - Varicella-zoster vaccine IM  Patient was given the opportunity to ask questions.  Patient verbalized understanding of the plan and was able to repeat key elements of the plan.   This documentation was completed using Paediatric nurse.  Any transcriptional errors are unintentional.  Orders Placed This Encounter  Procedures   Varicella-zoster vaccine IM   CBC   Comprehensive metabolic panel   Lipid panel     Requested  Prescriptions   Signed Prescriptions Disp Refills   amoxicillin-clavulanate (AUGMENTIN) 500-125 MG tablet 14 tablet 0    Sig: Take 1 tablet by mouth 2 (two) times daily.   Varenicline Tartrate, Starter, (CHANTIX STARTING MONTH PAK) 0.5 MG X 11 & 1 MG X 42 TBPK 53 each 0    Sig: Take 0.5 mg by mouth daily for day 1 to 3, then 0.5 mg two times a day for 4 to 7 days then 1 tablet two times daily.   albuterol (VENTOLIN HFA) 108 (90 Base) MCG/ACT inhaler 6.7 g 2    Sig: Inhale 2 puffs into the lungs every 4 (four) hours as needed for wheezing or shortness of breath. And cough   benzonatate (TESSALON) 100 MG capsule 20 capsule 0    Sig: Take 1-2 capsule by mouth three times daily as needed    Return in about 4 months (around 10/16/2023).  Jonah Blue, MD, FACP

## 2023-06-17 NOTE — Telephone Encounter (Signed)
CT order has been changed to [facility].

## 2023-06-17 NOTE — Telephone Encounter (Signed)
Nicki Guadalajara with [facility] pre-service called saying pt is supposed to come into tomorrow to [place] for CT chest lung ca screening but the authorization says [facility].  Schedule for tomorrow  CB@ [phone]  x [phone]

## 2023-06-17 NOTE — Patient Instructions (Addendum)
[facility]  Phone number: [phone] Address: [address] [place], [place] [address]  Chest some Coricidin HBP cough syrup over-the-counter and use as needed for cough and congestion.  I also refilled the Tessalon Perles if you would like to use those instead for cough. Use the Vicks vapor rub under the nose and on the chest as discussed. Keep upcoming appointment for CAT scan of the chest for lung cancer screening.

## 2023-06-18 ENCOUNTER — Ambulatory Visit (HOSPITAL_COMMUNITY): Payer: Commercial Managed Care - HMO

## 2023-06-18 LAB — COMPREHENSIVE METABOLIC PANEL
ALT: 10 [IU]/L (ref 0–32)
AST: 17 [IU]/L (ref 0–40)
Albumin: 4.4 g/dL (ref 3.9–4.9)
Alkaline Phosphatase: 79 [IU]/L (ref 44–121)
BUN/Creatinine Ratio: 15 (ref 12–28)
BUN: 15 mg/dL (ref 8–27)
Bilirubin Total: 0.3 mg/dL (ref 0.0–1.2)
CO2: 22 mmol/L (ref 20–29)
Calcium: 10.5 mg/dL — ABNORMAL HIGH (ref 8.7–10.3)
Chloride: 108 mmol/L — ABNORMAL HIGH (ref 96–106)
Creatinine, Ser: 0.97 mg/dL (ref 0.57–1.00)
Globulin, Total: 2.3 g/dL (ref 1.5–4.5)
Glucose: 115 mg/dL — ABNORMAL HIGH (ref 70–99)
Potassium: 4 mmol/L (ref 3.5–5.2)
Sodium: 142 mmol/L (ref 134–144)
Total Protein: 6.7 g/dL (ref 6.0–8.5)
eGFR: 66 mL/min/{1.73_m2} (ref >59)

## 2023-06-18 LAB — LIPID PANEL
Chol/HDL Ratio: 2.7 {ratio} (ref 0.0–4.4)
Cholesterol, Total: 170 mg/dL (ref 100–199)
HDL: 64 mg/dL (ref >39)
LDL Chol Calc (NIH): 84 mg/dL (ref 0–99)
Triglycerides: 123 mg/dL (ref 0–149)
VLDL Cholesterol Cal: 22 mg/dL (ref 5–40)

## 2023-06-18 LAB — CBC
Hematocrit: 42.1 % (ref 34.0–46.6)
Hemoglobin: 14.3 g/dL (ref 11.1–15.9)
MCH: 29.9 pg (ref 26.6–33.0)
MCHC: 34 g/dL (ref 31.5–35.7)
MCV: 88 fL (ref 79–97)
Platelets: 249 10*3/uL (ref 150–450)
RBC: 4.78 x10E6/uL (ref 3.77–5.28)
RDW: 12.6 % (ref 11.7–15.4)
WBC: 9.4 10*3/uL (ref 3.4–10.8)

## 2023-06-18 NOTE — Addendum Note (Signed)
Addended by: Jonah Blue B on: 06/18/2023 08:57 AM   Modules accepted: Orders

## 2023-06-21 LAB — HEMOGLOBIN A1C
Est. average glucose Bld gHb Est-mCnc: 117 mg/dL
Hgb A1c MFr Bld: 5.7 % — ABNORMAL HIGH (ref 4.8–5.6)

## 2023-06-21 LAB — SPECIMEN STATUS REPORT

## 2023-06-24 ENCOUNTER — Telehealth: Payer: Self-pay

## 2023-06-24 NOTE — Telephone Encounter (Signed)
-----   Message from Jonah Blue sent at 06/21/2023  6:24 PM EST ----- She is in the range for prediabetes ie borderline.  Healthy eating habits and regular exercise will help prevent progression to full diabetes.

## 2023-06-24 NOTE — Telephone Encounter (Signed)
Pt was called and is aware of results, DOB was confirmed.  ?

## 2023-06-27 ENCOUNTER — Other Ambulatory Visit: Payer: Self-pay

## 2023-07-03 ENCOUNTER — Inpatient Hospital Stay: Admission: RE | Admit: 2023-07-03 | Payer: Commercial Managed Care - HMO | Source: Ambulatory Visit

## 2023-07-22 ENCOUNTER — Ambulatory Visit
Admission: RE | Admit: 2023-07-22 | Discharge: 2023-07-22 | Disposition: A | Discharge status: Home / Self Care | Payer: Self-pay | Source: Ambulatory Visit | Attending: Internal Medicine | Admitting: Internal Medicine

## 2023-07-22 ENCOUNTER — Encounter: Payer: Self-pay | Admitting: Internal Medicine

## 2023-07-22 DIAGNOSIS — Z122 Encounter for screening for malignant neoplasm of respiratory organs: Secondary | ICD-10-CM

## 2023-07-23 ENCOUNTER — Other Ambulatory Visit: Payer: Self-pay

## 2023-07-25 ENCOUNTER — Other Ambulatory Visit: Payer: Self-pay

## 2023-07-25 ENCOUNTER — Other Ambulatory Visit: Payer: Self-pay | Admitting: Internal Medicine

## 2023-07-25 DIAGNOSIS — K219 Gastro-esophageal reflux disease without esophagitis: Secondary | ICD-10-CM

## 2023-07-25 MED ORDER — OMEPRAZOLE 20 MG PO CPDR
20.0000 mg | DELAYED_RELEASE_CAPSULE | Freq: Every day | ORAL | 3 refills | Status: AC
  Filled 2023-07-25: qty 90, 90d supply, fill #0
  Filled 2023-08-22 – 2023-12-11 (×2): qty 90, 90d supply, fill #1
  Filled 2024-03-09: qty 90, 90d supply, fill #2
  Filled 2024-06-16 – 2024-06-18 (×3): qty 90, 90d supply, fill #3

## 2023-07-25 NOTE — Telephone Encounter (Signed)
Requested Prescriptions  Pending Prescriptions Disp Refills   omeprazole (PRILOSEC) 20 MG capsule 90 capsule 3    Sig: Take 1 capsule (20 mg total) by mouth daily.     Gastroenterology: Proton Pump Inhibitors Passed - 07/25/2023  3:26 PM      Passed - Valid encounter within last 12 months    Recent Outpatient Visits           1 month ago Resistant hypertension   [facility] [place]. [facility] Marcine Matar, MD   4 months ago Resistant hypertension   [facility] Of [place]. [facility] Marcine Matar, MD   10 months ago Colon cancer screening   [facility] Of [place]. [facility] [place], Marzella Schlein, [place]   1 year ago Essential hypertension   [facility] Of [place]. [facility] Marcine Matar, MD   1 year ago Essential hypertension   [facility] Dept Of [place]. [facility] Marcine Matar, MD       Future Appointments             In 2 months Laural Benes Binnie Rail, MD [facility] - [facility] Dept Of [place]igha Bridegroom. [facility]

## 2023-07-28 ENCOUNTER — Other Ambulatory Visit: Payer: Self-pay

## 2023-07-30 ENCOUNTER — Other Ambulatory Visit: Payer: Self-pay

## 2023-08-02 ENCOUNTER — Encounter: Payer: Self-pay | Admitting: Internal Medicine

## 2023-08-02 ENCOUNTER — Telehealth: Payer: Self-pay | Admitting: Internal Medicine

## 2023-08-02 NOTE — Telephone Encounter (Signed)
Phone call placed to patient today to go over the results of screening CT of the lungs.  We had a bad connection where patient was not able to hear me well.  I hung up I tried calling her again but the same thing happened.  I told her that I will send her her results via MyChart and she can let me know if she has any questions.

## 2023-08-03 ENCOUNTER — Other Ambulatory Visit: Payer: Self-pay | Admitting: Internal Medicine

## 2023-08-03 DIAGNOSIS — I2510 Atherosclerotic heart disease of native coronary artery without angina pectoris: Secondary | ICD-10-CM

## 2023-08-13 ENCOUNTER — Ambulatory Visit: Payer: No Typology Code available for payment source | Attending: Internal Medicine | Admitting: Internal Medicine

## 2023-08-13 ENCOUNTER — Encounter: Payer: Self-pay | Admitting: Internal Medicine

## 2023-08-13 ENCOUNTER — Other Ambulatory Visit: Payer: Self-pay

## 2023-08-13 VITALS — BP 154/90 | HR 68 | Ht 60.0 in | Wt 95.2 lb

## 2023-08-13 DIAGNOSIS — I10 Essential (primary) hypertension: Secondary | ICD-10-CM | POA: Diagnosis not present

## 2023-08-13 DIAGNOSIS — I1A0 Resistant hypertension: Secondary | ICD-10-CM

## 2023-08-13 DIAGNOSIS — R002 Palpitations: Secondary | ICD-10-CM

## 2023-08-13 MED ORDER — HYDRALAZINE HCL 50 MG PO TABS
100.0000 mg | ORAL_TABLET | Freq: Three times a day (TID) | ORAL | 6 refills | Status: DC
  Filled 2023-08-13 – 2023-09-17 (×3): qty 135, 23d supply, fill #0
  Filled 2023-12-12: qty 135, 23d supply, fill #1
  Filled 2024-01-26: qty 135, 23d supply, fill #2
  Filled 2024-03-09: qty 135, 23d supply, fill #3

## 2023-08-13 NOTE — Patient Instructions (Signed)
 Medication Instructions:  Increase hydrazline to 100 mg three times daily  *If you need a refill on your cardiac medications before your next appointment, please call your pharmacy*   Lab Work: None  If you have labs (blood work) drawn today and your tests are completely normal, you will receive your results only by: MyChart Message (if you have MyChart) OR A paper copy in the mail If you have any lab test that is abnormal or we need to change your treatment, we will call you to review the results.   Testing/Procedures: None    Follow-Up: At [facility], you and your health needs are our priority.  As part of our continuing mission to provide you with exceptional heart care, we have created designated Provider Care Teams.  These Care Teams include your primary Cardiologist (physician) and Advanced Practice Providers (APPs -  Physician Assistants and Nurse Practitioners) who all work together to provide you with the care you need, when you need it.    Your next appointment:   6 month(s)  Provider:   With any APP  Other Instructions

## 2023-08-13 NOTE — Progress Notes (Signed)
 Cardiology Office Note:    Date:  08/13/2023   ID:  Lori Bush, DOB [birth year], MRN [id number]  PCP:  Vicci Barnie NOVAK, MD   [facility] Providers Cardiologist:  Alvan Ronal BRAVO, MD     Referring MD: Vicci Barnie NOVAK, MD   No chief complaint on file. Palpitations  History of Present Illness:    Lori Bush is a 63 y.o. female with a hx of arthritis, HTN, referral for palpitations during church. She notes them at night. Drinks 1 cup coffee per day. Decaf does not help. No syncope. TSH is normal. K 3.7 . BP is high today. Was on hydralazine  75 mg TID, was told to take 50 mg TID.  Thyroid function is normal   ECG in August 2023, poor baseline, see P waves.  Interim hx 08/13/2023 Notes Bps at home SBP 140s. She is still smoking. She tried the patches and chantix . No CP or SOB. No palpitations  Past Medical History:  Diagnosis Date   Allergy    Arthritis Dx 2012   Asthma    Hyperlipidemia    Hypertension Dx 2005   Seasonal allergies     Past Surgical History:  Procedure Laterality Date   CESAREAN SECTION     COLONOSCOPY     COLONOSCOPY WITH ESOPHAGOGASTRODUODENOSCOPY (EGD)  03/2007   HERNIA REPAIR  1971   as a child, umbilical   WISDOM TOOTH EXTRACTION      Current Medications: Current Outpatient Medications on File Prior to Visit  Medication Sig Dispense Refill   albuterol  (VENTOLIN  HFA) 108 (90 Base) MCG/ACT inhaler Inhale 2 puffs into the lungs every 4 (four) hours as needed for wheezing or shortness of breath. And cough 6.7 g 2   amLODipine  (NORVASC ) 10 MG tablet Take 1 tablet (10 mg total) by mouth daily. 90 tablet 1   amoxicillin -clavulanate (AUGMENTIN ) 500-125 MG tablet Take 1 tablet by mouth 2 (two) times daily. 14 tablet 0   atorvastatin  (LIPITOR) 10 MG tablet Take 1 tablet (10 mg total) by mouth daily. 90 tablet 1   benzonatate  (TESSALON ) 100 MG capsule Take 1-2 capsule by mouth three times daily as needed 20 capsule 0   carvedilol   (COREG ) 25 MG tablet Take 1 tablet (25 mg total) by mouth 2 (two) times daily with a meal. 180 tablet 0   cetirizine  (ZYRTEC  ALLERGY) 10 MG tablet Take 1 tablet (10 mg total) by mouth daily. 30 tablet 11   fluticasone  (FLONASE ) 50 MCG/ACT nasal spray Place 2 sprays into both nostrils daily. 16 g 3   hydrALAZINE  (APRESOLINE ) 50 MG tablet Take 1.5 tablets (75 mg total) by mouth 3 (three) times daily. 135 tablet 6   ibuprofen  (ADVIL ) 600 MG tablet TAKE 1 TABLET EVERY 6 HOURS AS NEEDED. 30 tablet 0   losartan  (COZAAR ) 100 MG tablet Take 1 tablet (100 mg total) by mouth once daily. 90 tablet 2   nicotine  (NICODERM CQ  - DOSED IN MG/24 HOURS) 21 mg/24hr patch Place 1 patch (21 mg total) onto the skin daily. 28 patch 1   omeprazole  (PRILOSEC) 20 MG capsule Take 1 capsule (20 mg total) by mouth daily. 90 capsule 3   solifenacin  (VESICARE ) 5 MG tablet Take 1 tablet (5 mg total) by mouth daily. 30 tablet 5   Varenicline  Tartrate, Starter, (CHANTIX  STARTING MONTH PAK) 0.5 MG X 11 & 1 MG X 42 TBPK Take 0.5 mg by mouth daily for day 1 to 3, then 0.5 mg two times a day for  4 to 7 days then 1 tablet two times daily. 53 each 0   No current facility-administered medications on file prior to visit.     Allergies:   Hydrochlorothiazide    Social History   Socioeconomic History   Marital status: Single    Spouse name: Not on file   Number of children: 5   Years of education: Not on file   Highest education level: Not on file  Occupational History   Occupation: technical brewer  Tobacco Use   Smoking status: Every Day    Current packs/day: 0.25    Types: Cigarettes   Smokeless tobacco: Never   Tobacco comments:    states she cut back 3 cigarettes/day  Vaping Use   Vaping status: Never Used  Substance and Sexual Activity   Alcohol use: No   Drug use: No   Sexual activity: Not Currently  Other Topics Concern   Not on file  Social History Narrative   Not on file   Social Drivers of Health    Financial Resource Strain: High Risk (06/17/2023)   Overall Financial Resource Strain (CARDIA)    Difficulty of Paying Living Expenses: Hard  Food Insecurity: Food Insecurity Present (06/17/2023)   Hunger Vital Sign    Worried About Running Out of Food in the Last Year: Sometimes true    Ran Out of Food in the Last Year: Sometimes true  Transportation Needs: No Transportation Needs (06/17/2023)   PRAPARE - Administrator, Civil Service (Medical): No    Lack of Transportation (Non-Medical): No  Physical Activity: Insufficiently Active (06/17/2023)   Exercise Vital Sign    Days of Exercise per Week: 2 days    Minutes of Exercise per Session: 20 min  Stress: No Stress Concern Present (06/17/2023)   [employer] of Occupational Health - Occupational Stress Questionnaire    Feeling of Stress : Not at all  Social Connections: Moderately Integrated (06/17/2023)   Social Connection and Isolation Panel [NHANES]    Frequency of Communication with Friends and Family: Three times a week    Frequency of Social Gatherings with Friends and Family: Twice a week    Attends Religious Services: More than 4 times per year    Active Member of [employer] or Organizations: Yes    Attends Engineer, Structural: More than 4 times per year    Marital Status: Never married     Family History: The patient's family history includes Arthritis in her brother and sister; Dementia in her father; Heart disease in her mother; Hypertension in her brother, father, mother, and sister. There is no history of Colon cancer, Stomach cancer, Rectal cancer, Esophageal cancer, or Colon polyps.  ROS:   Please see the history of present illness.     All other systems reviewed and are negative.  EKGs/Labs/Other Studies Reviewed:    The following studies were reviewed today:   EKG:  EKG is  ordered today.  The ekg ordered today demonstrates   08/12/2022- NSR with short PR. LVH  EKG  Interpretation Date/Time:    Ventricular Rate:    PR Interval:    QRS Duration:    QT Interval:    QTC Calculation:   R Axis:      Text Interpretation:     Recent Labs: 06/17/2023: ALT 10; BUN 15; Creatinine, Ser 0.97; Hemoglobin 14.3; Platelets 249; Potassium 4.0; Sodium 142   Recent Lipid Panel    Component Value Date/Time   CHOL 170 06/17/2023 1550  TRIG 123 06/17/2023 1550   HDL 64 06/17/2023 1550   CHOLHDL 2.7 06/17/2023 1550   CHOLHDL 3.0 09/30/2013 1509   VLDL 25 09/30/2013 1509   LDLCALC 84 06/17/2023 1550     Risk Assessment/Calculations:     Physical Exam:    VS:   Vitals:   08/13/23 1502  BP: (!) 154/90  Pulse: 68  SpO2: 97%     Wt Readings from Last 3 Encounters:  06/17/23 99 lb (44.9 kg)  05/13/23 92 lb (41.7 kg)  03/17/23 90 lb (40.8 kg)     GEN:  Well nourished, well developed in no acute distress HEENT: Normal NECK: No JVD; No carotid bruits LYMPHATICS: No lymphadenopathy CARDIAC: RRR, no murmurs, rubs, gallops RESPIRATORY:  Clear to auscultation without rales, wheezing or rhonchi  ABDOMEN: Soft, non-tender, non-distended MUSCULOSKELETAL:  No edema; No deformity  SKIN: Warm and dry NEUROLOGIC:  Alert and oriented x 3 PSYCHIATRIC:  Normal affect   ASSESSMENT:    Benign palpitations: Her cardiac monitor showed 1 triggered event for minimal SVT.  Resistant HTN:  Poorly controlled still. Goal BP < 130/80 mmHg. Continue Norvasc  10 mg daily, losartan  100 mg daily, and coreg  25 mg BID.  Will increase her hydralazine  to 100 mg 3 times daily. renal duplex was normal  Tobacco Abuse: We discussed it is best to try cold turkey to quit PLAN:    In order of problems listed above:  Increased hydralazine  Can follow-up with an APP in 6 months          Medication Adjustments/Labs and Tests Ordered: Current medicines are reviewed at length with the patient today.  Concerns regarding medicines are outlined above.  No orders of the defined  types were placed in this encounter.  No orders of the defined types were placed in this encounter.   There are no Patient Instructions on file for this visit.   Signed, Alvan Ronal BRAVO, MD  08/13/2023 2:55 PM    [facility]

## 2023-08-22 ENCOUNTER — Other Ambulatory Visit: Payer: Self-pay

## 2023-08-29 ENCOUNTER — Other Ambulatory Visit: Payer: Self-pay

## 2023-09-17 ENCOUNTER — Other Ambulatory Visit: Payer: Self-pay

## 2023-09-17 ENCOUNTER — Other Ambulatory Visit: Payer: Self-pay | Admitting: Physician Assistant

## 2023-09-17 DIAGNOSIS — I10 Essential (primary) hypertension: Secondary | ICD-10-CM

## 2023-09-19 ENCOUNTER — Other Ambulatory Visit: Payer: Self-pay

## 2023-09-29 ENCOUNTER — Other Ambulatory Visit: Payer: Self-pay | Admitting: Physician Assistant

## 2023-09-29 DIAGNOSIS — I10 Essential (primary) hypertension: Secondary | ICD-10-CM

## 2023-09-30 ENCOUNTER — Telehealth: Payer: Self-pay | Admitting: Internal Medicine

## 2023-09-30 ENCOUNTER — Other Ambulatory Visit: Payer: Self-pay

## 2023-09-30 ENCOUNTER — Other Ambulatory Visit: Payer: Self-pay | Admitting: Internal Medicine

## 2023-09-30 DIAGNOSIS — I10 Essential (primary) hypertension: Secondary | ICD-10-CM

## 2023-09-30 MED ORDER — LOSARTAN POTASSIUM 100 MG PO TABS
100.0000 mg | ORAL_TABLET | Freq: Every day | ORAL | 0 refills | Status: DC
  Filled 2023-09-30: qty 90, 90d supply, fill #0

## 2023-09-30 NOTE — Telephone Encounter (Signed)
 Pt came in this afternoon requesting refill on losartan I told her she has 2 downstairs. She also stated the pharmacy said she needs the pcp to approved the sign order.    losartan (COZAAR) 100 MG tablet [[id number]]

## 2023-09-30 NOTE — Telephone Encounter (Signed)
 Refill for lorsartan sent to pharmacy

## 2023-10-16 ENCOUNTER — Ambulatory Visit: Payer: Commercial Managed Care - HMO | Attending: Internal Medicine | Admitting: Internal Medicine

## 2023-10-16 ENCOUNTER — Encounter: Payer: Self-pay | Admitting: Internal Medicine

## 2023-10-16 VITALS — BP 135/83 | HR 68 | Temp 98.2°F | Ht 60.0 in | Wt 100.0 lb

## 2023-10-16 DIAGNOSIS — R079 Chest pain, unspecified: Secondary | ICD-10-CM | POA: Diagnosis not present

## 2023-10-16 DIAGNOSIS — I700 Atherosclerosis of aorta: Secondary | ICD-10-CM | POA: Diagnosis not present

## 2023-10-16 DIAGNOSIS — Z23 Encounter for immunization: Secondary | ICD-10-CM

## 2023-10-16 DIAGNOSIS — F17210 Nicotine dependence, cigarettes, uncomplicated: Secondary | ICD-10-CM

## 2023-10-16 DIAGNOSIS — I10 Essential (primary) hypertension: Secondary | ICD-10-CM | POA: Diagnosis not present

## 2023-10-16 DIAGNOSIS — F17200 Nicotine dependence, unspecified, uncomplicated: Secondary | ICD-10-CM

## 2023-10-16 NOTE — Progress Notes (Signed)
 Patient ID: Lori Bush, female    DOB: 1961/03/16  MRN: [id number]  CC: Hypertension (HTN f/u. Ottis Stain on L shoulder radiating to underarm X 1 week/Pt requesting colonoscopy - pt informed that previous was not a good reading. /No to pneumonia vax)   Subjective: Lori Bush is a 63 y.o. female who presents for chronic ds management. Her concerns today include:  Hx of HTN, hep C cured with Harvoni, tobacco dep, underweight   Discussed the use of AI scribe software for clinical note transcription with the patient, who gave verbal consent to proceed.  History of Present Illness   The patient, with a history of hypertension, presents with left shoulder pain that has been ongoing for a week and a half. The pain is intermittent and is located on the lateral part of the breast, extending to the shoulder and upper chest. The patient describes the pain as similar to a pulled muscle. The pain is exacerbated when lying on the left side and is relieved with the use of Icy Hot and Aleve. The patient denies any associated burning, numbness, or tingling, and the pain does not radiate down the arm. The patient works in a [employer], which involves frequent lifting and pulling, but cannot identify a specific incident that may have caused the pain.  The pain is no worse or better with exertion.  The patient is right-handed and denies any associated shortness of breath.  HTN: The patient's hypertension is managed with amlodipine 10mg  daily, losartan 100mg  daily, hydralazine 100mg  three times daily, and carvedilol 25mg  twice daily. The patient also takes atorvastatin 10mg  daily for cholesterol management. The patient's blood pressure at home is typically in the 130s/80s, and she limits her salt intake.   Tobacco dependence the patient is a current smoker, smoking four to five cigarettes per day, down from a previous higher amount. The patient had been prescribed Chantix to aid in smoking  cessation but decided to stop taking it due to concerns about potential adverse reactions while taking care of her grandkids.         Patient Active Problem List   Diagnosis Date Noted   Palpitations 06/25/2022   History of anemia 06/25/2022   History of Helicobacter pylori infection 06/25/2022   Elevated blood pressure reading with diagnosis of hypertension 03/19/2021   Aortic atherosclerosis (HCC) 11/27/2020   Renal mass 02/27/2018   Helicobacter pylori (H. pylori) infection 02/27/2018   Lung nodule, multiple 02/27/2018   Tobacco dependence 03/17/2017   Cyst of skin 03/17/2017   Primary osteoarthritis of right knee 10/28/2016   Chronic right shoulder pain 01/17/2015   Hypertension 03/05/2013     Current Outpatient Medications on File Prior to Visit  Medication Sig Dispense Refill   albuterol (VENTOLIN HFA) 108 (90 Base) MCG/ACT inhaler Inhale 2 puffs into the lungs every 4 (four) hours as needed for wheezing or shortness of breath. And cough 6.7 g 2   amLODipine (NORVASC) 10 MG tablet Take 1 tablet (10 mg total) by mouth daily. 90 tablet 1   amoxicillin-clavulanate (AUGMENTIN) 500-125 MG tablet Take 1 tablet by mouth 2 (two) times daily. 14 tablet 0   atorvastatin (LIPITOR) 10 MG tablet Take 1 tablet (10 mg total) by mouth daily. 90 tablet 1   carvedilol (COREG) 25 MG tablet Take 1 tablet (25 mg total) by mouth 2 (two) times daily with a meal. 180 tablet 0   cetirizine (ZYRTEC ALLERGY) 10 MG tablet Take 1 tablet (10 mg total)  by mouth daily. 30 tablet 11   fluticasone (FLONASE) 50 MCG/ACT nasal spray Place 2 sprays into both nostrils daily. 16 g 3   hydrALAZINE (APRESOLINE) 50 MG tablet Take 2 tablets (100 mg total) by mouth 3 (three) times daily. 135 tablet 6   losartan (COZAAR) 100 MG tablet Take 1 tablet (100 mg total) by mouth once daily. 90 tablet 0   omeprazole (PRILOSEC) 20 MG capsule Take 1 capsule (20 mg total) by mouth daily. 90 capsule 3   solifenacin (VESICARE) 5 MG  tablet Take 1 tablet (5 mg total) by mouth daily. 30 tablet 5   benzonatate (TESSALON) 100 MG capsule Take 1-2 capsule by mouth three times daily as needed (Patient not taking: Reported on 10/16/2023) 20 capsule 0   ibuprofen (ADVIL) 600 MG tablet TAKE 1 TABLET EVERY 6 HOURS AS NEEDED. (Patient not taking: Reported on 10/16/2023) 30 tablet 0   nicotine (NICODERM CQ - DOSED IN MG/24 HOURS) 21 mg/24hr patch Place 1 patch (21 mg total) onto the skin daily. (Patient not taking: Reported on 10/16/2023) 28 patch 1   Varenicline Tartrate, Starter, (CHANTIX STARTING MONTH PAK) 0.5 MG X 11 & 1 MG X 42 TBPK Take 0.5 mg by mouth daily for day 1 to 3, then 0.5 mg two times a day for 4 to 7 days then 1 tablet two times daily. (Patient not taking: Reported on 10/16/2023) 53 each 0   No current facility-administered medications on file prior to visit.    Allergies  Allergen Reactions   Hydrochlorothiazide     Causes low Potassium and high calcium.    Social History   Socioeconomic History   Marital status: Single    Spouse name: Not on file   Number of children: 5   Years of education: Not on file   Highest education level: Not on file  Occupational History   Occupation: Technical brewer  Tobacco Use   Smoking status: Every Day    Current packs/day: 0.25    Types: Cigarettes   Smokeless tobacco: Never   Tobacco comments:    states she cut back 3 cigarettes/day  Vaping Use   Vaping status: Never Used  Substance and Sexual Activity   Alcohol use: No   Drug use: No   Sexual activity: Not Currently  Other Topics Concern   Not on file  Social History Narrative   Not on file   Social Drivers of Health   Financial Resource Strain: High Risk (06/17/2023)   Overall Financial Resource Strain (CARDIA)    Difficulty of Paying Living Expenses: Hard  Food Insecurity: Food Insecurity Present (06/17/2023)   Hunger Vital Sign    Worried About Running Out of Food in the Last Year: Sometimes true     Ran Out of Food in the Last Year: Sometimes true  Transportation Needs: No Transportation Needs (06/17/2023)   PRAPARE - Administrator, Civil Service (Medical): No    Lack of Transportation (Non-Medical): No  Physical Activity: Insufficiently Active (06/17/2023)   Exercise Vital Sign    Days of Exercise per Week: 2 days    Minutes of Exercise per Session: 20 min  Stress: No Stress Concern Present (06/17/2023)   [employer] of Occupational Health - Occupational Stress Questionnaire    Feeling of Stress : Not at all  Social Connections: Moderately Integrated (06/17/2023)   Social Connection and Isolation Panel [NHANES]    Frequency of Communication with Friends and Family: Three times a week  Frequency of Social Gatherings with Friends and Family: Twice a week    Attends Religious Services: More than 4 times per year    Active Member of [employer] or Organizations: Yes    Attends Engineer, structural: More than 4 times per year    Marital Status: Never married  Intimate Partner Violence: Not At Risk (06/17/2023)   Humiliation, Afraid, Rape, and Kick questionnaire    Fear of Current or Ex-Partner: No    Emotionally Abused: No    Physically Abused: No    Sexually Abused: No    Family History  Problem Relation Age of Onset   Hypertension Mother    Heart disease Mother    Hypertension Father    Dementia Father    Arthritis Sister    Hypertension Sister    Arthritis Brother    Hypertension Brother    Colon cancer Neg Hx    Stomach cancer Neg Hx    Rectal cancer Neg Hx    Esophageal cancer Neg Hx    Colon polyps Neg Hx     Past Surgical History:  Procedure Laterality Date   CESAREAN SECTION     COLONOSCOPY     COLONOSCOPY WITH ESOPHAGOGASTRODUODENOSCOPY (EGD)  03/2007   HERNIA REPAIR  1971   as a child, umbilical   WISDOM TOOTH EXTRACTION      ROS: Review of Systems Negative except as stated above  PHYSICAL EXAM: BP 135/83 (BP Location: Left  Arm, Patient Position: Sitting, Cuff Size: Normal)   Pulse 68   Temp 98.2 F (36.8 C) (Oral)   Ht 5' (1.524 m)   Wt 100 lb (45.4 kg)   SpO2 98%   BMI 19.53 kg/m   Physical Exam  General appearance - alert, well appearing, and in no distress Mental status - normal mood, behavior, speech, dress, motor activity, and thought processes Neck - supple, no significant adenopathy Chest - clear to auscultation, no wheezes, rales or rhonchi, symmetric air entry Heart - normal rate, regular rhythm, normal S1, S2, no murmurs, rubs, clicks or gallops Musculoskeletal -no reproducible left-sided chest wall tenderness.  She has good range of motion of the left shoulder. Extremities -no lower extremity edema      Latest Ref Rng & Units 06/17/2023    3:50 PM 06/24/2022    4:33 PM 06/27/2021    2:46 PM  CMP  Glucose 70 - 99 mg/dL 161  86  88   BUN 8 - 27 mg/dL 15  19  13    Creatinine 0.57 - 1.00 mg/dL 0.96  0.45  4.09   Sodium 134 - 144 mmol/L 142  140  142   Potassium 3.5 - 5.2 mmol/L 4.0  3.7  3.5   Chloride 96 - 106 mmol/L 108  106  106   CO2 20 - 29 mmol/L 22   25   Calcium 8.7 - 10.3 mg/dL 81.1  91.4  78.2   Total Protein 6.0 - 8.5 g/dL 6.7  6.2  7.1   Total Bilirubin 0.0 - 1.2 mg/dL 0.3  0.3  0.6   Alkaline Phos 44 - 121 IU/L 79  75  63   AST 0 - 40 IU/L 17  17  18    ALT 0 - 32 IU/L 10   12    Lipid Panel     Component Value Date/Time   CHOL 170 06/17/2023 1550   TRIG 123 06/17/2023 1550   HDL 64 06/17/2023 1550   CHOLHDL 2.7 06/17/2023  1550   CHOLHDL 3.0 09/30/2013 1509   VLDL 25 09/30/2013 1509   LDLCALC 84 06/17/2023 1550    CBC    Component Value Date/Time   WBC 9.4 06/17/2023 1550   WBC 7.2 06/27/2021 1446   RBC 4.78 06/17/2023 1550   RBC 4.70 06/27/2021 1446   HGB 14.3 06/17/2023 1550   HCT 42.1 06/17/2023 1550   PLT 249 06/17/2023 1550   MCV 88 06/17/2023 1550   MCH 29.9 06/17/2023 1550   MCH 29.1 06/27/2021 1446   MCHC 34.0 06/17/2023 1550   MCHC 33.7  06/27/2021 1446   RDW 12.6 06/17/2023 1550   LYMPHSABS 2.5 06/24/2022 1633   MONOABS 0.6 06/27/2021 1446   EOSABS 0.7 (H) 06/24/2022 1633   BASOSABS 0.1 06/24/2022 1633   EKG: Normal sinus rhythm with LVH without acute ischemic changes.  Appears unchanged from previous EKG done in February of this year  ASSESSMENT AND PLAN: 1. Essential hypertension (Primary) Close to goal.  She is on maximum dose of her medications.  We will have her continue the amlodipine 10 mg, Cozaar 100 mg daily, carvedilol 25 mg daily and hydralazine 100 mg 3 times a day  2. Tobacco dependence Commended her on cutting back.  Strongly encouraged her to quit.  3. Chest pain in adult Patient presenting with some pain in the axilla but goes to the shoulder and the right upper chest for over a week.  Likely musculoskeletal in nature but she does have risk factors for heart disease.  EKG reveals no acute changes advised trial of Tylenol for the next 2 to 3 days.  If no improvement or any worsening, she should let me know so that we can refer to cardiology for cardiac workup.  She expressed understanding - EKG 12-Lead  4. Aortic atherosclerosis (HCC) Continue statin therapy  5. Need for vaccination against Streptococcus pneumoniae Patient declined vaccine    Patient was given the opportunity to ask questions.  Patient verbalized understanding of the plan and was able to repeat key elements of the plan.   This documentation was completed using Paediatric nurse.  Any transcriptional errors are unintentional.  No orders of the defined types were placed in this encounter.    Requested Prescriptions    No prescriptions requested or ordered in this encounter    No follow-ups on file.  Jonah Blue, MD, FACP

## 2023-11-06 ENCOUNTER — Other Ambulatory Visit: Payer: Self-pay

## 2023-12-11 ENCOUNTER — Other Ambulatory Visit: Payer: Self-pay

## 2023-12-12 ENCOUNTER — Other Ambulatory Visit: Payer: Self-pay

## 2023-12-12 ENCOUNTER — Other Ambulatory Visit: Payer: Self-pay | Admitting: Physician Assistant

## 2023-12-12 ENCOUNTER — Other Ambulatory Visit: Payer: Self-pay | Admitting: Internal Medicine

## 2023-12-12 DIAGNOSIS — J0110 Acute frontal sinusitis, unspecified: Secondary | ICD-10-CM

## 2023-12-12 DIAGNOSIS — I1A0 Resistant hypertension: Secondary | ICD-10-CM

## 2023-12-12 DIAGNOSIS — I700 Atherosclerosis of aorta: Secondary | ICD-10-CM

## 2023-12-12 MED ORDER — ATORVASTATIN CALCIUM 10 MG PO TABS
10.0000 mg | ORAL_TABLET | Freq: Every day | ORAL | 1 refills | Status: DC
  Filled 2023-12-12: qty 90, 90d supply, fill #0
  Filled 2024-04-06: qty 90, 90d supply, fill #1

## 2023-12-12 MED ORDER — AMLODIPINE BESYLATE 10 MG PO TABS
10.0000 mg | ORAL_TABLET | Freq: Every day | ORAL | 1 refills | Status: DC
  Filled 2023-12-12: qty 90, 90d supply, fill #0
  Filled 2024-03-09: qty 90, 90d supply, fill #1

## 2023-12-12 MED ORDER — CETIRIZINE HCL 10 MG PO TABS
10.0000 mg | ORAL_TABLET | Freq: Every day | ORAL | 11 refills | Status: AC
  Filled 2023-12-12: qty 30, 30d supply, fill #0
  Filled 2024-01-26: qty 30, 30d supply, fill #1
  Filled 2024-03-09: qty 30, 30d supply, fill #2
  Filled 2024-04-06: qty 30, 30d supply, fill #3
  Filled 2024-04-29: qty 30, 30d supply, fill #4
  Filled 2024-06-16 – 2024-07-12 (×2): qty 30, 30d supply, fill #5
  Filled 2024-08-04: qty 30, 30d supply, fill #6

## 2023-12-12 MED ORDER — CARVEDILOL 25 MG PO TABS
25.0000 mg | ORAL_TABLET | Freq: Two times a day (BID) | ORAL | 0 refills | Status: DC
  Filled 2023-12-12: qty 180, 90d supply, fill #0

## 2023-12-12 MED ORDER — FLUTICASONE PROPIONATE 50 MCG/ACT NA SUSP
2.0000 | Freq: Every day | NASAL | 3 refills | Status: AC
  Filled 2023-12-12 – 2024-04-06 (×2): qty 16, 30d supply, fill #0
  Filled 2024-06-16 – 2024-07-12 (×2): qty 16, 30d supply, fill #1

## 2023-12-15 ENCOUNTER — Other Ambulatory Visit: Payer: Self-pay

## 2023-12-16 ENCOUNTER — Other Ambulatory Visit: Payer: Self-pay

## 2023-12-17 ENCOUNTER — Other Ambulatory Visit: Payer: Self-pay

## 2024-01-26 ENCOUNTER — Other Ambulatory Visit: Payer: Self-pay

## 2024-01-26 ENCOUNTER — Other Ambulatory Visit: Payer: Self-pay | Admitting: Internal Medicine

## 2024-01-26 DIAGNOSIS — N3941 Urge incontinence: Secondary | ICD-10-CM

## 2024-01-26 DIAGNOSIS — I10 Essential (primary) hypertension: Secondary | ICD-10-CM

## 2024-01-26 MED ORDER — LOSARTAN POTASSIUM 100 MG PO TABS
100.0000 mg | ORAL_TABLET | Freq: Every day | ORAL | 0 refills | Status: DC
  Filled 2024-01-26: qty 90, 90d supply, fill #0

## 2024-01-26 MED ORDER — SOLIFENACIN SUCCINATE 5 MG PO TABS
5.0000 mg | ORAL_TABLET | Freq: Every day | ORAL | 5 refills | Status: AC
  Filled 2024-01-26: qty 30, 30d supply, fill #0
  Filled 2024-03-09: qty 30, 30d supply, fill #1
  Filled 2024-04-06: qty 30, 30d supply, fill #2
  Filled 2024-04-29: qty 30, 30d supply, fill #3
  Filled 2024-06-16 – 2024-06-18 (×3): qty 30, 30d supply, fill #4
  Filled 2024-08-04: qty 30, 30d supply, fill #5

## 2024-01-27 ENCOUNTER — Other Ambulatory Visit: Payer: Self-pay

## 2024-01-28 ENCOUNTER — Other Ambulatory Visit: Payer: Self-pay

## 2024-02-05 ENCOUNTER — Other Ambulatory Visit: Payer: Self-pay

## 2024-02-06 ENCOUNTER — Other Ambulatory Visit: Payer: Self-pay

## 2024-02-19 ENCOUNTER — Telehealth: Payer: Self-pay | Admitting: Internal Medicine

## 2024-02-19 NOTE — Telephone Encounter (Signed)
 Doris volunteer called to confirm appt for 8/15

## 2024-02-20 ENCOUNTER — Other Ambulatory Visit: Payer: Self-pay

## 2024-02-20 ENCOUNTER — Ambulatory Visit: Attending: Internal Medicine | Admitting: Internal Medicine

## 2024-02-20 ENCOUNTER — Encounter: Payer: Self-pay | Admitting: Internal Medicine

## 2024-02-20 VITALS — BP 158/88 | HR 72 | Temp 98.1°F | Ht 61.0 in | Wt 119.2 lb

## 2024-02-20 DIAGNOSIS — I1A0 Resistant hypertension: Secondary | ICD-10-CM | POA: Diagnosis not present

## 2024-02-20 DIAGNOSIS — F17210 Nicotine dependence, cigarettes, uncomplicated: Secondary | ICD-10-CM

## 2024-02-20 DIAGNOSIS — E7800 Pure hypercholesterolemia, unspecified: Secondary | ICD-10-CM | POA: Diagnosis not present

## 2024-02-20 DIAGNOSIS — J329 Chronic sinusitis, unspecified: Secondary | ICD-10-CM

## 2024-02-20 DIAGNOSIS — F17200 Nicotine dependence, unspecified, uncomplicated: Secondary | ICD-10-CM

## 2024-02-20 MED ORDER — SPIRONOLACTONE 25 MG PO TABS
12.5000 mg | ORAL_TABLET | Freq: Every day | ORAL | 3 refills | Status: AC
  Filled 2024-02-20: qty 30, 60d supply, fill #0
  Filled 2024-03-09: qty 45, 90d supply, fill #0
  Filled 2024-04-06 – 2024-05-25 (×2): qty 45, 90d supply, fill #1
  Filled 2024-06-16 – 2024-08-04 (×2): qty 45, 90d supply, fill #2

## 2024-02-20 NOTE — Progress Notes (Signed)
 Patient ID: Lori Bush, female    DOB: 1961/01/30  MRN: [id number]  CC: Hypertension and Follow-up (Also having some sinus issues)   Subjective: Lori Bush is a 63 y.o. female who presents for chronic ds management. Her concerns today include:  Hx of HTN, hep C cured with Harvoni , tobacco dep, underweight   Discussed the use of AI scribe software for clinical note transcription with the patient, who gave verbal consent to proceed.  History of Present Illness Lori Bush is a 63 year old female with hypertension who presents for follow-up and sinus issues.  Chronic sinus congestion: She experiences persistent sinus congestion, describing her nasal passages as 'clogged all the time.' The congestion is almost constant, with some days being worse than others. She is currently taking Zyrtec  daily and uses Flonase  nasal spray two to three times a week, primarily when she feels she cannot breathe through her nose. She tried Zyrtec  D for one day, which helped, but discontinued it due to concerns about her blood pressure.  HTN: She monitors her blood pressure daily, with readings at home ranging from 136 to 141 systolic and 79 to 84 diastolic. She is on multiple antihypertensive medications: amlodipine  10 mg daily, losartan  100 mg daily, carvedilol  25 mg twice a day, and hydralazine  100 mg three times a day. She occasionally misses the nighttime dose of hydralazine , especially if she falls asleep early, but notes that when she takes it consistently, her blood pressure remains stable. She takes carvedilol  twice a day, with the second dose at lunch, and hydralazine  three times a day, with the second dose around 2 PM.  HL: She is currently taking atorvastatin  for cholesterol management.  Tob dep:  She has reduced her cigarette smoking but has not quit entirely. She plans to be non-smoking during an upcoming church trip from August 25th to 29th.  HM: Advised to get flu vaccine  once it becomes available early fall.  Due for PCV 20 but we are currently out of it.  Advised that she can get that when she gets her flu shot at any outside pharmacy next month    Patient Active Problem List   Diagnosis Date Noted   Palpitations 06/25/2022   History of anemia 06/25/2022   History of Helicobacter pylori infection 06/25/2022   Elevated blood pressure reading with diagnosis of hypertension 03/19/2021   Aortic atherosclerosis (HCC) 11/27/2020   Renal mass 02/27/2018   Helicobacter pylori (H. pylori) infection 02/27/2018   Lung nodule, multiple 02/27/2018   Tobacco dependence 03/17/2017   Cyst of skin 03/17/2017   Primary osteoarthritis of right knee 10/28/2016   Chronic right shoulder pain 01/17/2015   Hypertension 03/05/2013     Current Outpatient Medications on File Prior to Visit  Medication Sig Dispense Refill   albuterol  (VENTOLIN  HFA) 108 (90 Base) MCG/ACT inhaler Inhale 2 puffs into the lungs every 4 (four) hours as needed for wheezing or shortness of breath. And cough 6.7 g 2   amLODipine  (NORVASC ) 10 MG tablet Take 1 tablet (10 mg total) by mouth daily. 90 tablet 1   atorvastatin  (LIPITOR) 10 MG tablet Take 1 tablet (10 mg total) by mouth daily. 90 tablet 1   carvedilol  (COREG ) 25 MG tablet Take 1 tablet (25 mg total) by mouth 2 (two) times daily with a meal. 180 tablet 0   cetirizine  (ZYRTEC  ALLERGY) 10 MG tablet Take 1 tablet (10 mg total) by mouth daily. 30 tablet 11   fluticasone  (FLONASE ) 50 MCG/ACT  nasal spray Place 2 sprays into both nostrils daily. 16 g 3   hydrALAZINE  (APRESOLINE ) 50 MG tablet Take 2 tablets (100 mg total) by mouth 3 (three) times daily. 135 tablet 6   losartan  (COZAAR ) 100 MG tablet Take 1 tablet (100 mg total) by mouth once daily. 90 tablet 0   nicotine  (NICODERM CQ  - DOSED IN MG/24 HOURS) 21 mg/24hr patch Place 1 patch (21 mg total) onto the skin daily. (Patient not taking: Reported on 10/16/2023) 28 patch 1   omeprazole  (PRILOSEC)  20 MG capsule Take 1 capsule (20 mg total) by mouth daily. 90 capsule 3   solifenacin  (VESICARE ) 5 MG tablet Take 1 tablet (5 mg total) by mouth daily. 30 tablet 5   No current facility-administered medications on file prior to visit.    Allergies  Allergen Reactions   Hydrochlorothiazide      Causes low Potassium and high calcium .    Social History   Socioeconomic History   Marital status: Single    Spouse name: Not on file   Number of children: 5   Years of education: Not on file   Highest education level: Not on file  Occupational History   Occupation: Technical brewer  Tobacco Use   Smoking status: Every Day    Current packs/day: 0.25    Types: Cigarettes   Smokeless tobacco: Never   Tobacco comments:    states she cut back 3 cigarettes/day  Vaping Use   Vaping status: Never Used  Substance and Sexual Activity   Alcohol use: No   Drug use: No   Sexual activity: Not Currently  Other Topics Concern   Not on file  Social History Narrative   Not on file   Social Drivers of Health   Financial Resource Strain: High Risk (06/17/2023)   Overall Financial Resource Strain (CARDIA)    Difficulty of Paying Living Expenses: Hard  Food Insecurity: Food Insecurity Present (06/17/2023)   Hunger Vital Sign    Worried About Running Out of Food in the Last Year: Sometimes true    Ran Out of Food in the Last Year: Sometimes true  Transportation Needs: No Transportation Needs (06/17/2023)   PRAPARE - Administrator, Civil Service (Medical): No    Lack of Transportation (Non-Medical): No  Physical Activity: Insufficiently Active (06/17/2023)   Exercise Vital Sign    Days of Exercise per Week: 2 days    Minutes of Exercise per Session: 20 min  Stress: No Stress Concern Present (06/17/2023)   [employer] of Occupational Health - Occupational Stress Questionnaire    Feeling of Stress : Not at all  Social Connections: Moderately Integrated (06/17/2023)   Social  Connection and Isolation Panel    Frequency of Communication with Friends and Family: Three times a week    Frequency of Social Gatherings with Friends and Family: Twice a week    Attends Religious Services: More than 4 times per year    Active Member of [employer] or Organizations: Yes    Attends Engineer, structural: More than 4 times per year    Marital Status: Never married  Intimate Partner Violence: Not At Risk (06/17/2023)   Humiliation, Afraid, Rape, and Kick questionnaire    Fear of Current or Ex-Partner: No    Emotionally Abused: No    Physically Abused: No    Sexually Abused: No    Family History  Problem Relation Age of Onset   Hypertension Mother    Heart disease Mother  Hypertension Father    Dementia Father    Arthritis Sister    Hypertension Sister    Arthritis Brother    Hypertension Brother    Colon cancer Neg Hx    Stomach cancer Neg Hx    Rectal cancer Neg Hx    Esophageal cancer Neg Hx    Colon polyps Neg Hx     Past Surgical History:  Procedure Laterality Date   CESAREAN SECTION     COLONOSCOPY     COLONOSCOPY WITH ESOPHAGOGASTRODUODENOSCOPY (EGD)  03/2007   HERNIA REPAIR  1971   as a child, umbilical   WISDOM TOOTH EXTRACTION      ROS: Review of Systems Negative except as stated above  PHYSICAL EXAM: BP (!) 158/88 (BP Location: Right Arm, Cuff Size: Normal)   Pulse 72   Temp 98.1 F (36.7 C) (Oral)   Ht 5' 1 (1.549 m)   Wt 119 lb 3.2 oz (54.1 kg)   SpO2 98%   BMI 22.52 kg/m   Physical Exam Repeat blood pressure was 174/98 General appearance - alert, well appearing, and in no distress Mental status - normal mood, behavior, speech, dress, motor activity, and thought processes Neck - supple, no significant adenopathy Nose: She has severe enlargement of by nasal turbinates Chest - clear to auscultation, no wheezes, rales or rhonchi, symmetric air entry Heart - normal rate, regular rhythm, normal S1, S2, no murmurs, rubs, clicks  or gallops Extremities - peripheral pulses normal, no pedal edema, no clubbing or cyanosis      Latest Ref Rng & Units 06/17/2023    3:50 PM 06/24/2022    4:33 PM 06/27/2021    2:46 PM  CMP  Glucose 70 - 99 mg/dL 884  86  88   BUN 8 - 27 mg/dL 15  19  13    Creatinine 0.57 - 1.00 mg/dL 9.02  8.97  9.16   Sodium 134 - 144 mmol/L 142  140  142   Potassium 3.5 - 5.2 mmol/L 4.0  3.7  3.5   Chloride 96 - 106 mmol/L 108  106  106   CO2 20 - 29 mmol/L 22   25   Calcium  8.7 - 10.3 mg/dL 89.4  89.6  89.5   Total Protein 6.0 - 8.5 g/dL 6.7  6.2  7.1   Total Bilirubin 0.0 - 1.2 mg/dL 0.3  0.3  0.6   Alkaline Phos 44 - 121 IU/L 79  75  63   AST 0 - 40 IU/L 17  17  18    ALT 0 - 32 IU/L 10   12    Lipid Panel     Component Value Date/Time   CHOL 170 06/17/2023 1550   TRIG 123 06/17/2023 1550   HDL 64 06/17/2023 1550   CHOLHDL 2.7 06/17/2023 1550   CHOLHDL 3.0 09/30/2013 1509   VLDL 25 09/30/2013 1509   LDLCALC 84 06/17/2023 1550    CBC    Component Value Date/Time   WBC 9.4 06/17/2023 1550   WBC 7.2 06/27/2021 1446   RBC 4.78 06/17/2023 1550   RBC 4.70 06/27/2021 1446   HGB 14.3 06/17/2023 1550   HCT 42.1 06/17/2023 1550   PLT 249 06/17/2023 1550   MCV 88 06/17/2023 1550   MCH 29.9 06/17/2023 1550   MCH 29.1 06/27/2021 1446   MCHC 34.0 06/17/2023 1550   MCHC 33.7 06/27/2021 1446   RDW 12.6 06/17/2023 1550   LYMPHSABS 2.5 06/24/2022 1633   MONOABS 0.6 06/27/2021  1446   EOSABS 0.7 (H) 06/24/2022 1633   BASOSABS 0.1 06/24/2022 1633    ASSESSMENT AND PLAN: 1. Resistant hypertension (Primary) Patient on 4 blood pressure medications and still not at goal.  We discussed adding low-dose of spironolactone  12.5 mg daily.  After she has been on it for 1 to 2 weeks, she should return to the lab to have chemistry done to check potassium level. Continue the amlodipine  10 mg, Cozaar  100 mg daily, carvedilol  25 mg daily and hydralazine  100 mg 3 times a day advised to take the second  dose of carvedilol  in the evening instead of at noon. -Discussed referral to advanced hypertension clinic and patient is agreeable. - Ambulatory referral to [facility] - spironolactone  (ALDACTONE ) 25 MG tablet; Take 0.5 tablets (12.5 mg total) by mouth daily.  Dispense: 90 tablet; Refill: 3 - Basic Metabolic Panel; Future  2. Tobacco dependence Continue to encourage smoking cessation.  Commended her on cutting back  3. Chronic congestion of paranasal sinus Continue Zyrtec  and Flonase .  Will refer to ENT - Ambulatory referral to ENT  4. Pure hypercholesterolemia Continue atorvastatin    Patient was given the opportunity to ask questions.  Patient verbalized understanding of the plan and was able to repeat key elements of the plan.   This documentation was completed using Paediatric nurse.  Any transcriptional errors are unintentional.  Orders Placed This Encounter  Procedures   Basic Metabolic Panel   Ambulatory referral to Advanced Hypertension Clinic   Ambulatory referral to ENT     Requested Prescriptions   Signed Prescriptions Disp Refills   spironolactone  (ALDACTONE ) 25 MG tablet 90 tablet 3    Sig: Take 0.5 tablets (12.5 mg total) by mouth daily.    Return in about 4 months (around 06/21/2024) for Give lab appointment in 1-2 weeks.  Barnie Louder, MD, FACP

## 2024-02-20 NOTE — Patient Instructions (Signed)
 VISIT SUMMARY:  During today's visit, we discussed your ongoing issues with high blood pressure and chronic nasal congestion. We also reviewed your current medications and smoking habits.  YOUR PLAN:  -HYPERTENSION: Hypertension, or high blood pressure, remains uncontrolled despite your current medications. Uncontrolled hypertension can lead to kidney damage. We are adding spironolactone  12.5 mg daily to your regimen and referring you to a hypertension clinic for further management. Please monitor your blood pressure twice a week and record the readings. We will also check your potassium levels after 1-2 weeks on spironolactone .  -CHRONIC NASAL CONGESTION: Chronic nasal congestion means your nasal passages are persistently clogged. Since your current treatment with Zyrtec  and Flonase  is not fully effective, we are referring you to an ENT specialist for further evaluation.  -HYPERLIPIDEMIA: Hyperlipidemia means you have high cholesterol levels. You should continue taking atorvastatin  as prescribed to manage this condition.  -TOBACCO USE: You are still smoking but have reduced your usage. Quitting smoking is highly recommended for your overall health. Your upcoming trip is a good opportunity to stop smoking completely.  INSTRUCTIONS:  Please follow up with the hypertension clinic as referred. Monitor your blood pressure twice a week and record the readings. We will check your potassium levels after 1-2 weeks on spironolactone . Additionally, please schedule an appointment with an ENT specialist for your chronic nasal congestion.

## 2024-02-26 ENCOUNTER — Other Ambulatory Visit: Payer: Self-pay

## 2024-02-27 ENCOUNTER — Other Ambulatory Visit

## 2024-03-01 ENCOUNTER — Other Ambulatory Visit: Payer: Self-pay

## 2024-03-02 ENCOUNTER — Other Ambulatory Visit: Payer: Self-pay

## 2024-03-09 ENCOUNTER — Ambulatory Visit: Attending: Family Medicine

## 2024-03-09 ENCOUNTER — Other Ambulatory Visit: Payer: Self-pay

## 2024-03-09 DIAGNOSIS — I1A0 Resistant hypertension: Secondary | ICD-10-CM | POA: Diagnosis not present

## 2024-03-10 ENCOUNTER — Other Ambulatory Visit: Payer: Self-pay | Admitting: Internal Medicine

## 2024-03-10 ENCOUNTER — Ambulatory Visit: Payer: Self-pay | Admitting: Internal Medicine

## 2024-03-10 ENCOUNTER — Other Ambulatory Visit: Payer: Self-pay

## 2024-03-10 LAB — BASIC METABOLIC PANEL WITH GFR
BUN/Creatinine Ratio: 18 (ref 12–28)
BUN: 17 mg/dL (ref 8–27)
CO2: 21 mmol/L (ref 20–29)
Calcium: 10.6 mg/dL — ABNORMAL HIGH (ref 8.7–10.3)
Chloride: 107 mmol/L — ABNORMAL HIGH (ref 96–106)
Creatinine, Ser: 0.95 mg/dL (ref 0.57–1.00)
Glucose: 104 mg/dL — ABNORMAL HIGH (ref 70–99)
Potassium: 4.2 mmol/L (ref 3.5–5.2)
Sodium: 140 mmol/L (ref 134–144)
eGFR: 67 mL/min/1.73 (ref >59)

## 2024-03-16 ENCOUNTER — Ambulatory Visit: Attending: Family Medicine

## 2024-03-16 DIAGNOSIS — J329 Chronic sinusitis, unspecified: Secondary | ICD-10-CM | POA: Diagnosis not present

## 2024-03-17 ENCOUNTER — Ambulatory Visit: Payer: Self-pay | Admitting: Internal Medicine

## 2024-03-17 ENCOUNTER — Other Ambulatory Visit: Payer: Self-pay | Admitting: Internal Medicine

## 2024-03-18 ENCOUNTER — Other Ambulatory Visit: Payer: Self-pay

## 2024-03-18 ENCOUNTER — Other Ambulatory Visit (HOSPITAL_BASED_OUTPATIENT_CLINIC_OR_DEPARTMENT_OTHER): Payer: Self-pay

## 2024-03-18 ENCOUNTER — Ambulatory Visit: Payer: Self-pay | Admitting: Internal Medicine

## 2024-03-18 ENCOUNTER — Encounter (HOSPITAL_BASED_OUTPATIENT_CLINIC_OR_DEPARTMENT_OTHER): Payer: Self-pay | Admitting: Family

## 2024-03-18 ENCOUNTER — Telehealth (HOSPITAL_BASED_OUTPATIENT_CLINIC_OR_DEPARTMENT_OTHER): Payer: Self-pay | Admitting: Licensed Clinical Social Worker

## 2024-03-18 ENCOUNTER — Ambulatory Visit (INDEPENDENT_AMBULATORY_CARE_PROVIDER_SITE_OTHER): Admitting: Family

## 2024-03-18 ENCOUNTER — Other Ambulatory Visit: Payer: Self-pay | Admitting: Internal Medicine

## 2024-03-18 VITALS — BP 130/78 | HR 71 | Ht 60.0 in | Wt 122.7 lb

## 2024-03-18 DIAGNOSIS — I1A0 Resistant hypertension: Secondary | ICD-10-CM | POA: Diagnosis not present

## 2024-03-18 DIAGNOSIS — Z79899 Other long term (current) drug therapy: Secondary | ICD-10-CM | POA: Diagnosis not present

## 2024-03-18 DIAGNOSIS — Z720 Tobacco use: Secondary | ICD-10-CM

## 2024-03-18 DIAGNOSIS — E213 Hyperparathyroidism, unspecified: Secondary | ICD-10-CM

## 2024-03-18 LAB — PTH, INTACT AND CALCIUM
Calcium: 10.5 mg/dL — ABNORMAL HIGH (ref 8.7–10.3)
PTH: 97 pg/mL — AB (ref 15–65)

## 2024-03-18 LAB — PE AND FLC, SERUM
Alpha 1: 3.6 g/dL (ref 2.9–4.4)
Alpha 2: 0.9 g/dL (ref 0.4–1.0)
Beta: 0.9 g/dL (ref 0.7–1.3)
Gamma Globulin: 1 g/dL (ref 0.7–1.8)
Globulin, Total: 3.1 g/dL (ref 2.2–3.9)
Ig Kappa Free Light Chain: 20.3 mg/L — AB (ref 3.3–19.4)
Ig Lambda Free Light Chain: 16.9 mg/L (ref 5.7–26.3)
KAPPA/LAMBDA RATIO: 1.2 (ref 0.26–1.65)
KAPPA/LAMBDA RATIO: 20.3 mg/L — AB (ref 3.3–19.4)
M-Spike, %: 1 g/dL (ref 0.4–1.8)
M-Spike, %: 3.1 g/dL (ref 2.2–3.9)
Please Note:: 16.9 mg/L (ref 5.7–26.3)
Total Protein: 6.7 g/dL (ref 6.0–8.5)

## 2024-03-18 LAB — VITAMIN D 25 HYDROXY (VIT D DEFICIENCY, FRACTURES): Vit D, 25-Hydroxy: 20.9 ng/mL — ABNORMAL LOW (ref 30.0–100.0)

## 2024-03-18 LAB — TSH: TSH: 6.17 u[IU]/mL — ABNORMAL HIGH (ref 0.450–4.500)

## 2024-03-18 MED ORDER — AMLODIPINE BESYLATE-VALSARTAN 10-320 MG PO TABS
1.0000 | ORAL_TABLET | Freq: Every day | ORAL | 1 refills | Status: AC
Start: 2024-03-18 — End: ?
  Filled 2024-03-18: qty 90, 90d supply, fill #0
  Filled 2024-06-16 – 2024-06-18 (×3): qty 90, 90d supply, fill #1

## 2024-03-18 MED ORDER — HYDRALAZINE HCL 50 MG PO TABS
100.0000 mg | ORAL_TABLET | Freq: Two times a day (BID) | ORAL | 0 refills | Status: DC
  Filled 2024-03-18 – 2024-04-29 (×2): qty 120, 30d supply, fill #0

## 2024-03-18 NOTE — Progress Notes (Signed)
 [facility]pertension [facility] Initial Assessment:    Date:  03/18/2024   ID:  Lori Bush, DOB [birth year], MRN [id number]  PCP:  Vicci Barnie NOVAK, MD  Cardiologist:  Alvan Ronal BRAVO, MD (Inactive)  Nephrologist:  Referring MD: Vicci Barnie NOVAK, MD   CC: Hypertension  History of Present Illness:    Lori Bush is a 63 y.o. female with a hx of tobacco use, palpitations, hypertension, aortic atherosclerosis, HLD, vitamin D  deficiency here to establish care in the [facility].   Renal duplex 08/2022 with no renal artery stenosis. CT 07/2023 normal adrenal glands.  Seen by Dr. Ronal Alvan 08/13/23  for palpitations. Once up of coffee per day. Normal thyroid. Monitor with one triggered event for minimal SVT. Due to poorly controlled BP Hydralazine  increased to 100mg  TID. Amlodipine  10mg  daily, Losartan  100mg  daily, Coreg  25mg  BID continued.  At visit 02/20/24 with PCP Spironolactone  added and referred to [facility].  Lori Bush was diagnosed with hypertension around 64 years old. However, also had trouble with high blood pressure during pregnancy. With her first child was admitted to the hospital a week prior to delivery for high blood pressure. Also had high blood pressure with her twin, but none with her last pregnancy. Worked in the [employer] for the last 40 years, not presently working at this time. It has been difficult to control. Blood pressure checked with arm cuff at home. Readings have been 130-140/80s. she reports tobacco use daily with 3 cigarettes per day, working to decrease. Alcohol use never. For exercise she has no formal routine. she eats at home and outside of the home and does follow low sodium diet. She drinks water during the day and just one cup of coffee.   She sleeps well and wakes feeling well rested. Reports no shortness of breath nor dyspnea on exertion. Reports no chest pain, pressure, or tightness. No  edema, orthopnea, PND. Reports no palpitations.    Previous antihypertensives: Lisinopril  - cough Hydrochlorothiazide  - low potassium  Past Medical History:  Diagnosis Date   Allergy    Arthritis Dx 2012   Asthma    Hyperlipidemia    Hypertension Dx 2005   Seasonal allergies     Past Surgical History:  Procedure Laterality Date   CESAREAN SECTION     COLONOSCOPY     COLONOSCOPY WITH ESOPHAGOGASTRODUODENOSCOPY (EGD)  03/2007   HERNIA REPAIR  1971   as a child, umbilical   WISDOM TOOTH EXTRACTION      Current Medications: Current Meds  Medication Sig   albuterol  (VENTOLIN  HFA) 108 (90 Base) MCG/ACT inhaler Inhale 2 puffs into the lungs every 4 (four) hours as needed for wheezing or shortness of breath. And cough   amLODipine -valsartan  (EXFORGE ) 10-320 MG tablet Take 1 tablet by mouth daily.Stop amlodipine , stop losartan    atorvastatin  (LIPITOR) 10 MG tablet Take 1 tablet (10 mg total) by mouth daily.   carvedilol  (COREG ) 25 MG tablet Take 1 tablet (25 mg total) by mouth 2 (two) times daily with a meal.   cetirizine  (ZYRTEC  ALLERGY) 10 MG tablet Take 1 tablet (10 mg total) by mouth daily.   fluticasone  (FLONASE ) 50 MCG/ACT nasal spray Place 2 sprays into both nostrils daily.   omeprazole  (PRILOSEC) 20 MG capsule Take 1 capsule (20 mg total) by mouth daily.   solifenacin  (VESICARE ) 5 MG tablet Take 1 tablet (5 mg total) by mouth daily.   spironolactone  (ALDACTONE ) 25 MG tablet Take 0.5 tablets (12.5 mg total)  by mouth daily.   [DISCONTINUED] amLODipine  (NORVASC ) 10 MG tablet Take 1 tablet (10 mg total) by mouth daily.   [DISCONTINUED] hydrALAZINE  (APRESOLINE ) 50 MG tablet Take 2 tablets (100 mg total) by mouth 3 (three) times daily.   [DISCONTINUED] losartan  (COZAAR ) 100 MG tablet Take 1 tablet (100 mg total) by mouth once daily.     Allergies:   Hydrochlorothiazide    Social History   Socioeconomic History   Marital status: Single    Spouse name: Not on file   Number of  children: 5   Years of education: Not on file   Highest education level: Not on file  Occupational History   Occupation: Technical brewer  Tobacco Use   Smoking status: Every Day    Current packs/day: 0.25    Types: Cigarettes   Smokeless tobacco: Never   Tobacco comments:    states she cut back 3 cigarettes/day  Vaping Use   Vaping status: Never Used  Substance and Sexual Activity   Alcohol use: No   Drug use: No   Sexual activity: Not Currently  Other Topics Concern   Not on file  Social History Narrative   Not on file   Social Drivers of Health   Financial Resource Strain: High Risk (06/17/2023)   Overall Financial Resource Strain (CARDIA)    Difficulty of Paying Living Expenses: Hard  Food Insecurity: Food Insecurity Present (06/17/2023)   Hunger Vital Sign    Worried About Running Out of Food in the Last Year: Sometimes true    Ran Out of Food in the Last Year: Sometimes true  Transportation Needs: No Transportation Needs (06/17/2023)   PRAPARE - Administrator, Civil Service (Medical): No    Lack of Transportation (Non-Medical): No  Physical Activity: Insufficiently Active (06/17/2023)   Exercise Vital Sign    Days of Exercise per Week: 2 days    Minutes of Exercise per Session: 20 min  Stress: No Stress Concern Present (06/17/2023)   [employer] of Occupational Health - Occupational Stress Questionnaire    Feeling of Stress : Not at all  Social Connections: Moderately Integrated (06/17/2023)   Social Connection and Isolation Panel    Frequency of Communication with Friends and Family: Three times a week    Frequency of Social Gatherings with Friends and Family: Twice a week    Attends Religious Services: More than 4 times per year    Active Member of [employer] or Organizations: Yes    Attends Engineer, structural: More than 4 times per year    Marital Status: Never married     Family History: The patient's family history includes  Arthritis in her brother and sister; Dementia in her father; Heart disease in her mother; Hypertension in her brother, father, mother, and sister. There is no history of Colon cancer, Stomach cancer, Rectal cancer, Esophageal cancer, or Colon polyps.  ROS:   Please see the history of present illness.     All other systems reviewed and are negative.  EKGs/Labs/Other Studies Reviewed:    EKG Interpretation Date/Time:  Thursday March 18 2024 08:54:51 EDT Ventricular Rate:  63 PR Interval:  136 QRS Duration:  90 QT Interval:  434 QTC Calculation: 444 R Axis:   74  Text Interpretation: Normal sinus rhythm Nonspecific T wave abnormality Confirmed by Vannie Mora ([id number]) on 03/18/2024 9:02:36 AM    Recent Labs: 06/17/2023: ALT 10; Hemoglobin 14.3; Platelets 249 03/09/2024: BUN 17; Creatinine, Ser 0.95; Potassium 4.2; Sodium 140  03/16/2024: TSH 6.170   Recent Lipid Panel    Component Value Date/Time   CHOL 170 06/17/2023 1550   TRIG 123 06/17/2023 1550   HDL 64 06/17/2023 1550   CHOLHDL 2.7 06/17/2023 1550   CHOLHDL 3.0 09/30/2013 1509   VLDL 25 09/30/2013 1509   LDLCALC 84 06/17/2023 1550    Physical Exam:   VS:  BP 130/78 (BP Location: Left Arm, Patient Position: Sitting, Cuff Size: Normal)   Pulse 71   Ht 5' (1.524 m)   Wt 122 lb 11.2 oz (55.7 kg)   SpO2 99%   BMI 23.96 kg/m  , BMI Body mass index is 23.96 kg/m. GENERAL:  Well appearing HEENT: Pupils equal round and reactive, fundi not visualized, oral mucosa unremarkable NECK:  No jugular venous distention, waveform within normal limits, carotid upstroke brisk and symmetric, no bruits, no thyromegaly LYMPHATICS:  No cervical adenopathy LUNGS:  Clear to auscultation bilaterally HEART:  RRR.  PMI not displaced or sustained,S1 and S2 within normal limits, no S3, no S4, no clicks, no rubs, no murmurs ABD:  Flat, positive bowel sounds normal in frequency in pitch, no bruits, no rebound, no guarding, no midline pulsatile  mass, no hepatomegaly, no splenomegaly EXT:  2 plus pulses throughout, no edema, no cyanosis no clubbing SKIN:  No rashes no nodules NEURO:  Cranial nerves II through XII grossly intact, motor grossly intact throughout PSYCH:  Cognitively intact, oriented to person place and time   ASSESSMENT/PLAN:    HTN - BP not at goal <130/80. Difficulty with TID Hydralazine  dosing. Desires simplification of regimen Stop Losartan . Stop Amlodipine . Start Amlodipine -Valsartan  10-320mg  daily Change Hydralazine  to 100mg  BID. Pending response to Amlodipine -Valsartan  may be able to further reduce.  Continue Coreg  25mg  BID, Spironolactone  12.5mg  daily.  BMET in 2 weeks Refer to PREP exercise program  Tobacco use - Smoking cessation encouraged. Recommend utilization of 1800QUITNOW. She is working to decrease. Smoking 3 cigarettes per day. Previously did not tolerate Chantix , nicotine  patch.  Screening for Secondary Hypertension:     03/18/2024    1:00 PM  Causes  Renovascular HTN Screened  Sleep Apnea N/A     - Comments no symptoms of sleep apnea  Cushing's Syndrome N/A     - Comments non-cushinoid appearance    Relevant Labs/Studies:    Latest Ref Rng & Units 03/09/2024    9:12 AM 06/17/2023    3:50 PM 06/24/2022    4:33 PM  Basic Labs  Sodium 134 - 144 mmol/L 140  142  140   Potassium 3.5 - 5.2 mmol/L 4.2  4.0  3.7   Creatinine 0.57 - 1.00 mg/dL 9.04  9.02  8.97        Latest Ref Rng & Units 03/16/2024   11:08 AM 06/24/2022    4:33 PM  Thyroid   TSH 0.450 - 4.500 uIU/mL 6.170  4.170                 08/29/2022   11:35 AM  Renovascular   Renal Artery US  Completed Yes     she is interested in enrolling in the PREP exercise and nutrition program through the [facility].     Disposition:    FU with MD/APP/PharmD in 2 months    Medication Adjustments/Labs and Tests Ordered: Current medicines are reviewed at length with the patient today.  Concerns regarding medicines are outlined above.   Orders Placed This Encounter  Procedures   Basic Metabolic Panel (BMET)   Amb Referral  To Provider Referral Exercise Program (P.R.E.P)   EKG 12-Lead   Meds ordered this encounter  Medications   amLODipine -valsartan  (EXFORGE ) 10-320 MG tablet    Sig: Take 1 tablet by mouth daily.Stop amlodipine , stop losartan     Dispense:  90 tablet    Refill:  1    Stop amlodipine , stop losartan     Supervising Provider:   LONNI SLAIN [[id number]]   hydrALAZINE  (APRESOLINE ) 50 MG tablet    Sig: Take 2 tablets (100 mg total) by mouth in the morning and at bedtime.    Dispense:  120 tablet    Refill:  0    Supervising Provider:   LONNI SLAIN [[id number]]     Signed, Reche GORMAN Finder, NP  03/18/2024 1:05 PM    [facility]

## 2024-03-18 NOTE — Patient Instructions (Addendum)
 Medication Instructions:  STOP Amlodipine  STOP Losartan   CHANGE Hydralazine  to 100mg  twice daily (take two of your 50mg  tablets twice per day)  START Amlodipine -Valsartan  10-320mg  daily   Labwork: Your physician recommends that you return for lab work in 2 weeks: BMET   Testing/Procedures: Your EKG today looked good!   Follow-Up: Please follow up in 2 months in ADV HTN CLINIC with Dr. Raford, Reche Finder, NP or Allean Mink PharmD    Special Instructions:    Tips to Measure your Blood Pressure Correctly  Here's what you can do to ensure a correct reading:  Don't drink a caffeinated beverage or smoke during the 30 minutes before the test.  Sit quietly for five minutes before the test begins.  During the measurement, sit in a chair with your feet on the floor and your arm supported so your elbow is at about heart level.  The inflatable part of the cuff should completely cover at least 80% of your upper arm, and the cuff should be placed on bare skin, not over a shirt.  Don't talk during the measurement.  Have your blood pressure measured twice, with a brief break in between. If the readings are different by 5 points or more, have it done a third time.  Blood pressure categories  Blood pressure category SYSTOLIC (upper number)  DIASTOLIC (lower number)  Normal Less than 120 mm Hg and Less than 80 mm Hg  Elevated 120-129 mm Hg and Less than 80 mm Hg  High blood pressure: Stage 1 hypertension 130-139 mm Hg or 80-89 mm Hg  High blood pressure: Stage 2 hypertension 140 mm Hg or higher or 90 mm Hg or higher  Hypertensive crisis (consult your doctor immediately) Higher than 180 mm Hg and/or Higher than 120 mm Hg  Source: American Heart Association and American Stroke Association. For more on getting your blood pressure under control, buy Controlling Your Blood Pressure, a Special Health Report from [facility].

## 2024-03-18 NOTE — Telephone Encounter (Signed)
 H&V Care Navigation CSW Progress Note  Clinical Social Worker completed chart review as NP had questions about insurance coverage. Two insurance plans on file, was able to locate Medicaid Forensic psychologist) and Medical laboratory scientific officer. Recommended pharmacy run test claim for preferred drug to see who is primary coverage. Should pt no longer need Ambetter plan will need to call and cancel coverage and report to Medicaid that coverage is no longer active.   Patient is participating in a Managed Medicaid Plan:  Yes  SDOH Screenings   Food Insecurity: Food Insecurity Present (06/17/2023)  Housing: Low Risk  (06/17/2023)  Transportation Needs: No Transportation Needs (06/17/2023)  Utilities: Not At Risk (06/17/2023)  Alcohol Screen: Low Risk  (06/17/2023)  Depression (PHQ2-9): Low Risk  (02/20/2024)  Financial Resource Strain: High Risk (06/17/2023)  Physical Activity: Insufficiently Active (06/17/2023)  Social Connections: Moderately Integrated (06/17/2023)  Stress: No Stress Concern Present (06/17/2023)  Tobacco Use: High Risk (03/18/2024)  Health Literacy: Adequate Health Literacy (06/17/2023)     Marit Lark, MSW, LCSW Clinical Social Worker II [facility] Heart/Vascular Care Navigation  [phone]- work cell phone (preferred)

## 2024-03-19 ENCOUNTER — Other Ambulatory Visit: Payer: Self-pay

## 2024-03-19 LAB — SPECIMEN STATUS REPORT

## 2024-03-19 LAB — T3, FREE: T3, Free: 3.3 pg/mL (ref 2.0–4.4)

## 2024-03-19 LAB — T4, FREE: Free T4: 1.2 ng/dL (ref 0.82–1.77)

## 2024-03-23 ENCOUNTER — Telehealth (HOSPITAL_BASED_OUTPATIENT_CLINIC_OR_DEPARTMENT_OTHER): Payer: Self-pay | Admitting: *Deleted

## 2024-03-23 NOTE — Telephone Encounter (Signed)
 RE: PREP Received: Nilsa Ash, Suzen Lyme, Leita Cc: Gladis Porter HERO, LPN We will give her a call, thanks! Kim       Previous Messages    ----- Message ----- From: Lyme Leita Sent: 03/18/2024   4:20 PM EDT To: Porter HERO Gladis, LPN; Suzen Ash Subject: PREP                                          Hi Adonna and Kim Please call See message below.    Thank you  Leita Lyme, BLANCH CAULK [facility]  [facility], [facility] Management Assistant [phone]   Order #: [id number] Procedure: Amb Referral To Provider Referral Exercise Program (P.R.E.P) Order Date: 03/18/2024 Proc Category: [facility] Outpatient Ref [place] Priority: Routine Status: Sent Class: Internal Referral Ordering User: Gladis Porter HERO, LPN Auth Provider: WALKER, CAITLIN S Enc Provider: Vannie Reche RAMAN, NP Diagnosis: Resistant hypertension Department: Dwb-cvd Bosie Pierce Instruct:  Reason for referral Hypertension Inactivity

## 2024-03-24 ENCOUNTER — Telehealth: Payer: Self-pay

## 2024-03-24 NOTE — Telephone Encounter (Signed)
 Spoke with Luke about the [employer] and she is interested in the [place] [facility] class starting October 21st. I will call her back to schedule initial assessment.

## 2024-04-06 ENCOUNTER — Other Ambulatory Visit: Payer: Self-pay | Admitting: Internal Medicine

## 2024-04-06 ENCOUNTER — Other Ambulatory Visit: Payer: Self-pay

## 2024-04-06 DIAGNOSIS — I1A0 Resistant hypertension: Secondary | ICD-10-CM

## 2024-04-06 MED ORDER — CARVEDILOL 25 MG PO TABS
25.0000 mg | ORAL_TABLET | Freq: Two times a day (BID) | ORAL | 0 refills | Status: DC
  Filled 2024-04-06: qty 60, 30d supply, fill #0

## 2024-04-07 ENCOUNTER — Other Ambulatory Visit: Payer: Self-pay

## 2024-04-09 ENCOUNTER — Other Ambulatory Visit: Payer: Self-pay

## 2024-04-12 ENCOUNTER — Telehealth: Payer: Self-pay

## 2024-04-12 NOTE — Telephone Encounter (Signed)
 Scheduled initial assessment for 10/14 10:30 for the upcoming PREP Class at the [facility] starting on 10/21.

## 2024-04-20 ENCOUNTER — Telehealth: Payer: Self-pay

## 2024-04-20 NOTE — Telephone Encounter (Signed)
 Lori Bush left me a message saying she would not be able to attend our initial assessment meeting today. I texted her back to try to reschedule.

## 2024-04-27 NOTE — Progress Notes (Signed)
 [facility] Evaluation  Patient Details  Name: Lori Bush MRN: [id number] Date of Birth: 04-May-1961 Age: 63 y.o. PCP: Vicci Barnie NOVAK, MD  Vitals:   04/27/24 1543  BP: (!) 193/124  Pulse: 68  SpO2: 96%  Weight: 123 lb 9.6 oz (56.1 kg)     [facility] Eval - 04/27/24 1500       [facility] PREP Location   [facility] PRE[facility]      Referral    Program Start Date 04/27/24      Measurement   Waist Circumference 33.5 inches    Hip Circumference 32.25 inches    Body fat 37.9 percent      Information for Trainer   Goals overall health, quit smoking, work on blood sugar    Current Exercise walking    Pertinent Medical History Hypertension      Mobility and Daily Activities   I find it easy to walk up or down two or more flights of stairs. 3    I have no trouble taking out the trash. 4    I do housework such as vacuuming and dusting on my own without difficulty. 4    I can easily lift a gallon of milk (8lbs). 4    I can easily walk a mile. 4    I have no trouble reaching into high cupboards or reaching down to pick up something from the floor. 4    I do not have trouble doing out-door work such as Loss adjuster, chartered, raking leaves, or gardening. 3      Mobility and Daily Activities   I feel younger than my age. 2    I feel independent. 4    I feel energetic. 4    I live an active life.  4    I feel strong. 3    I feel healthy. 3    I feel active as other people my age. 3      How fit and strong are you.   Fit and Strong Total Score 49         Past Medical History:  Diagnosis Date   Allergy    Arthritis Dx 2012   Asthma    Hyperlipidemia    Hypertension Dx 2005   Seasonal allergies    Past Surgical History:  Procedure Laterality Date   CESAREAN SECTION     COLONOSCOPY     COLONOSCOPY WITH ESOPHAGOGASTRODUODENOSCOPY (EGD)  03/2007   HERNIA REPAIR  1971   as a child, umbilical   WISDOM TOOTH EXTRACTION     Social History   Tobacco Use   Smoking Status Every Day   Current packs/day: 0.25   Types: Cigarettes  Smokeless Tobacco Never  Tobacco Comments   states she cut back 3 cigarettes/day   Juri is ready for the Prep program 10/21 at [facility] 04/27/2024, 3:56 PM

## 2024-04-29 ENCOUNTER — Other Ambulatory Visit: Payer: Self-pay

## 2024-05-04 ENCOUNTER — Other Ambulatory Visit: Payer: Self-pay

## 2024-05-04 NOTE — Progress Notes (Signed)
 [facility] PREP Weekly Session  Patient Details  Name: Keiona Jenison MRN: [id number] Date of Birth: 24-Jul-1960 Age: 63 y.o. PCP: Vicci Barnie NOVAK, MD  There were no vitals filed for this visit.   [facility] Weekly seesion - 05/04/24 1100       [facility] PREP Location   [facility] PREP Location [employer]      Weekly Session   Topic Discussed Importance of resistance training;Other ways to be active   Talk about cardio and strength training went over weekly sheet   Minutes exercised this week 150 minutes    Classes attended to date 3          Jerona Irving 05/04/2024, 11:06 AM

## 2024-05-11 NOTE — Progress Notes (Signed)
 [facility] PREP Weekly Session  Patient Details  Name: Lori Bush MRN: [id number] Date of Birth: April 24, 1961 Age: 63 y.o. PCP: Vicci Barnie NOVAK, MD  Vitals:   05/11/24 1139  Weight: 123 lb (55.8 kg)     [facility] Weekly seesion - 05/11/24 1100       [facility] PREP Location   [facility] PREP [facility]      Weekly Session   Topic Discussed Healthy eating tips   Talked about making healther options and talk about the yuka app   Minutes exercised this week 165 minutes    Classes attended to date 4          Jerona Irving 05/11/2024, 11:41 AM

## 2024-05-12 ENCOUNTER — Encounter (HOSPITAL_BASED_OUTPATIENT_CLINIC_OR_DEPARTMENT_OTHER): Payer: Self-pay

## 2024-05-13 ENCOUNTER — Ambulatory Visit (HOSPITAL_BASED_OUTPATIENT_CLINIC_OR_DEPARTMENT_OTHER): Admitting: Family

## 2024-05-13 ENCOUNTER — Encounter (HOSPITAL_BASED_OUTPATIENT_CLINIC_OR_DEPARTMENT_OTHER): Payer: Self-pay | Admitting: Family

## 2024-05-13 ENCOUNTER — Institutional Professional Consult (permissible substitution) (HOSPITAL_BASED_OUTPATIENT_CLINIC_OR_DEPARTMENT_OTHER): Admitting: Family

## 2024-05-13 ENCOUNTER — Other Ambulatory Visit: Payer: Self-pay

## 2024-05-13 VITALS — BP 112/70 | HR 71 | Ht 60.0 in | Wt 125.3 lb

## 2024-05-13 DIAGNOSIS — I1A0 Resistant hypertension: Secondary | ICD-10-CM

## 2024-05-13 DIAGNOSIS — Z720 Tobacco use: Secondary | ICD-10-CM | POA: Diagnosis not present

## 2024-05-13 MED ORDER — HYDRALAZINE HCL 50 MG PO TABS
50.0000 mg | ORAL_TABLET | Freq: Two times a day (BID) | ORAL | 1 refills | Status: AC
  Filled 2024-05-13 – 2024-08-04 (×2): qty 180, 90d supply, fill #0

## 2024-05-13 NOTE — Patient Instructions (Addendum)
 Medication Instructions:  REDUCE Hydralazine  to 50mg  (one tablet) twice per day  Follow-Up: Please follow up in 3-4 months in ADV HTN CLINIC with Dr. Raford, Reche Finder, NP or Allean Mink PharmD    Special Instructions:

## 2024-05-13 NOTE — Progress Notes (Signed)
 Advanced Hypertension Clinic Assessment:    Date:  05/13/2024   ID:  Lori Bush, DOB December 11, 1960, MRN [id number]  PCP:  Vicci Barnie NOVAK, MD  Cardiologist:  Alvan Ronal BRAVO, MD (Inactive)  Nephrologist:  Referring MD: Vicci Barnie NOVAK, MD   CC: Hypertension  History of Present Illness:    Lori Bush is a 63 y.o. female with a hx of tobacco use, palpitations, hypertension, aortic atherosclerosis, HLD, vitamin D  deficiency here to establish care in the Advanced Hypertension Clinic.   Renal duplex 08/2022 with no renal artery stenosis. CT 07/2023 normal adrenal glands.  Seen by Dr. Ronal Alvan 08/13/23  for palpitations. Once up of coffee per day. Normal thyroid. Monitor with one triggered event for minimal SVT. Due to poorly controlled BP Hydralazine  increased to 100mg  TID. Amlodipine  10mg  daily, Losartan  100mg  daily, Coreg  25mg  BID continued.  At visit 02/20/24 with PCP Spironolactone  added and referred to Advanced Hypertension Clinic.  Established with Advanced Hypertension Clinic 03/18/24. She was diagnosed with hypertension at 63 years old. Her losartasn and amlodipine  were stopped and Amlodipine -Valsartan  10-320mg  was initiated. Hydralazine  adjusted to 100mg  BID as was having difficulty with TID dosing.   Presents today for follow up. She is participating in PREP exercise class. Reports feeling well since last seen. Reports no shortness of breath nor dyspnea on exertion. Reports no chest pain, pressure, or tightness. No edema, orthopnea, PND. Reports no palpitations.  Reports BP at home has been routinely 110s/70s. Reports no lightheadedness, dizziness. Does note occasional fatigue when her BP is particularly low. She continues to limit to 3 cigarettes per day. She is making both changes in her exercise regimen and dietary habits through participation in the PREP program.   Previous antihypertensives: Lisinopril  - cough Hydrochlorothiazide  - low potassium  Past  Medical History:  Diagnosis Date   Allergy    Arthritis Dx 2012   Asthma    Hyperlipidemia    Hypertension Dx 2005   Seasonal allergies     Past Surgical History:  Procedure Laterality Date   CESAREAN SECTION     COLONOSCOPY     COLONOSCOPY WITH ESOPHAGOGASTRODUODENOSCOPY (EGD)  03/2007   HERNIA REPAIR  1971   as a child, umbilical   WISDOM TOOTH EXTRACTION      Current Medications: Current Meds  Medication Sig   albuterol  (VENTOLIN  HFA) 108 (90 Base) MCG/ACT inhaler Inhale 2 puffs into the lungs every 4 (four) hours as needed for wheezing or shortness of breath. And cough   amLODipine -valsartan  (EXFORGE ) 10-320 MG tablet Take 1 tablet by mouth daily.Stop amlodipine , stop losartan    atorvastatin  (LIPITOR) 10 MG tablet Take 1 tablet (10 mg total) by mouth daily.   Azelastine  HCl 137 MCG/SPRAY SOLN Nasal   carvedilol  (COREG ) 25 MG tablet Take 1 tablet (25 mg total) by mouth 2 (two) times daily with a meal.Must have office visit for refills   cetirizine  (ZYRTEC  ALLERGY) 10 MG tablet Take 1 tablet (10 mg total) by mouth daily.   fluticasone  (FLONASE ) 50 MCG/ACT nasal spray Place 2 sprays into both nostrils daily.   omeprazole  (PRILOSEC) 20 MG capsule Take 1 capsule (20 mg total) by mouth daily.   solifenacin  (VESICARE ) 5 MG tablet Take 1 tablet (5 mg total) by mouth daily.   spironolactone  (ALDACTONE ) 25 MG tablet Take 0.5 tablets (12.5 mg total) by mouth daily.   [DISCONTINUED] hydrALAZINE  (APRESOLINE ) 50 MG tablet Take 2 tablets (100 mg total) by mouth in the morning and at bedtime.  Allergies:   Hydrochlorothiazide    Social History   Socioeconomic History   Marital status: Single    Spouse name: Not on file   Number of children: 5   Years of education: Not on file   Highest education level: Not on file  Occupational History   Occupation: technical brewer  Tobacco Use   Smoking status: Every Day    Current packs/day: 0.25    Types: Cigarettes   Smokeless tobacco:  Never   Tobacco comments:    states she cut back 3 cigarettes/day  Vaping Use   Vaping status: Never Used  Substance and Sexual Activity   Alcohol use: No   Drug use: No   Sexual activity: Not Currently  Other Topics Concern   Not on file  Social History Narrative   Not on file   Social Drivers of Health   Financial Resource Strain: High Risk (06/17/2023)   Overall Financial Resource Strain (CARDIA)    Difficulty of Paying Living Expenses: Hard  Food Insecurity: Food Insecurity Present (06/17/2023)   Hunger Vital Sign    Worried About Running Out of Food in the Last Year: Sometimes true    Ran Out of Food in the Last Year: Sometimes true  Transportation Needs: No Transportation Needs (06/17/2023)   PRAPARE - Administrator, Civil Service (Medical): No    Lack of Transportation (Non-Medical): No  Physical Activity: Insufficiently Active (06/17/2023)   Exercise Vital Sign    Days of Exercise per Week: 2 days    Minutes of Exercise per Session: 20 min  Stress: No Stress Concern Present (06/17/2023)   [employer] of Occupational Health - Occupational Stress Questionnaire    Feeling of Stress : Not at all  Social Connections: Moderately Integrated (06/17/2023)   Social Connection and Isolation Panel    Frequency of Communication with Friends and Family: Three times a week    Frequency of Social Gatherings with Friends and Family: Twice a week    Attends Religious Services: More than 4 times per year    Active Member of [employer] or Organizations: Yes    Attends Engineer, Structural: More than 4 times per year    Marital Status: Never married     Family History: The patient's family history includes Arthritis in her brother and sister; Dementia in her father; Heart disease in her mother; Hypertension in her brother, father, mother, and sister. There is no history of Colon cancer, Stomach cancer, Rectal cancer, Esophageal cancer, or Colon polyps.  ROS:    Please see the history of present illness.     All other systems reviewed and are negative.  EKGs/Labs/Other Studies Reviewed:         Recent Labs: 06/17/2023: ALT 10; Hemoglobin 14.3; Platelets 249 03/09/2024: BUN 17; Creatinine, Ser 0.95; Potassium 4.2; Sodium 140 03/16/2024: TSH 6.170   Recent Lipid Panel    Component Value Date/Time   CHOL 170 06/17/2023 1550   TRIG 123 06/17/2023 1550   HDL 64 06/17/2023 1550   CHOLHDL 2.7 06/17/2023 1550   CHOLHDL 3.0 09/30/2013 1509   VLDL 25 09/30/2013 1509   LDLCALC 84 06/17/2023 1550    Physical Exam:   VS:  BP 112/70   Pulse 71   Ht 5' (1.524 m)   Wt 125 lb 4.8 oz (56.8 kg)   SpO2 96%   BMI 24.47 kg/m  , BMI Body mass index is 24.47 kg/m. GENERAL:  Well appearing HEENT: Pupils equal round and  reactive, fundi not visualized, oral mucosa unremarkable NECK:  No jugular venous distention, waveform within normal limits, carotid upstroke brisk and symmetric, no bruits, no thyromegaly LYMPHATICS:  No cervical adenopathy LUNGS:  Clear to auscultation bilaterally HEART:  RRR.  PMI not displaced or sustained,S1 and S2 within normal limits, no S3, no S4, no clicks, no rubs, no murmurs ABD:  Flat, positive bowel sounds normal in frequency in pitch, no bruits, no rebound, no guarding, no midline pulsatile mass, no hepatomegaly, no splenomegaly EXT:  2 plus pulses throughout, no edema, no cyanosis no clubbing SKIN:  No rashes no nodules NEURO:  Cranial nerves II through XII grossly intact, motor grossly intact throughout PSYCH:  Cognitively intact, oriented to person place and time   ASSESSMENT/PLAN:    HTN - BP well controlled, relatively hypotensive. Continue Amlodipine -Valsartan  10-320mg  daily, Carvedilol  25mg  BID, Spironolactone  25mg  daily.  Change Hydralazine  to 50mg  BID due to relative hypotension. Congratulated on lifestyle changes she is making through the [facility] program.   Tobacco use - Smoking cessation encouraged. Recommend  utilization of 1800QUITNOW. She is working to decrease. Smoking 3 cigarettes per day. Previously did not tolerate Chantix , nicotine  patch. Congratulated her on maintaining 3 cigarettes per day.   Screening for Secondary Hypertension:     03/18/2024    1:00 PM  Causes  Renovascular HTN Screened  Sleep Apnea N/A     - Comments no symptoms of sleep apnea  Cushing's Syndrome N/A     - Comments non-cushinoid appearance    Relevant Labs/Studies:    Latest Ref Rng & Units 03/09/2024    9:12 AM 06/17/2023    3:50 PM 06/24/2022    4:33 PM  Basic Labs  Sodium 134 - 144 mmol/L 140  142  140   Potassium 3.5 - 5.2 mmol/L 4.2  4.0  3.7   Creatinine 0.57 - 1.00 mg/dL 9.04  9.02  8.97        Latest Ref Rng & Units 03/16/2024   11:08 AM 06/24/2022    4:33 PM  Thyroid   TSH 0.450 - 4.500 uIU/mL 6.170  4.170                 08/29/2022   11:35 AM  Renovascular   Renal Artery US  Completed Yes       Disposition:    FU with MD/APP/PharmD in 3-4 months    Medication Adjustments/Labs and Tests Ordered: Current medicines are reviewed at length with the patient today.  Concerns regarding medicines are outlined above.  No orders of the defined types were placed in this encounter.  Meds ordered this encounter  Medications   hydrALAZINE  (APRESOLINE ) 50 MG tablet    Sig: Take 1 tablet (50 mg total) by mouth in the morning and at bedtime.    Dispense:  180 tablet    Refill:  1    Supervising Provider:   LONNI SLAIN [[id number]]     Signed, Reche GORMAN Finder, NP  05/13/2024 10:54 AM    [facility]

## 2024-05-25 ENCOUNTER — Other Ambulatory Visit: Payer: Self-pay

## 2024-05-25 NOTE — Progress Notes (Signed)
 [facility] PREP Weekly Session  Patient Details  Name: Chaneka Trefz MRN: [id number] Date of Birth: 07/10/1960 Age: 63 y.o. PCP: Vicci Barnie NOVAK, MD  Vitals:   05/25/24 1120  Weight: 124 lb 9.6 oz (56.5 kg)     [facility] Weekly seesion - 05/25/24 1100       [facility] PREP Location   [facility] PREP Location [facility]      Weekly Session   Topic Discussed Restaurant Eating   Restaurant eating tips, salt demo and talk   Minutes exercised this week 1440 minutes    Classes attended to date 5          [facility] 05/25/2024, 11:22 AM

## 2024-05-26 ENCOUNTER — Other Ambulatory Visit: Payer: Self-pay | Admitting: Internal Medicine

## 2024-05-26 ENCOUNTER — Other Ambulatory Visit: Payer: Self-pay

## 2024-05-26 DIAGNOSIS — Z1231 Encounter for screening mammogram for malignant neoplasm of breast: Secondary | ICD-10-CM

## 2024-06-01 NOTE — Progress Notes (Signed)
 [facility] PREP Weekly Session  Patient Details  Name: Lori Bush MRN: [id number] Date of Birth: Nov 21, 1960 Age: 63 y.o. PCP: Vicci Barnie NOVAK, MD  Vitals:   06/01/24 1116  Weight: 124 lb 9.6 oz (56.5 kg)     [facility] Weekly seesion - 06/01/24 1100       [facility] PREP Location   [facility] PREP Location [facility]      Weekly Session   Topic Discussed Stress management and problem solving   Talked about sleep, how reduce stress and avoiding stress   Minutes exercised this week 420 minutes    Classes attended to date 7          Jerona Irving 06/01/2024, 11:18 AM

## 2024-06-16 ENCOUNTER — Other Ambulatory Visit: Payer: Self-pay

## 2024-06-16 ENCOUNTER — Other Ambulatory Visit: Payer: Self-pay | Admitting: Internal Medicine

## 2024-06-16 ENCOUNTER — Other Ambulatory Visit (HOSPITAL_COMMUNITY): Payer: Self-pay

## 2024-06-16 DIAGNOSIS — I1A0 Resistant hypertension: Secondary | ICD-10-CM

## 2024-06-16 DIAGNOSIS — I700 Atherosclerosis of aorta: Secondary | ICD-10-CM

## 2024-06-16 MED ORDER — CARVEDILOL 25 MG PO TABS
25.0000 mg | ORAL_TABLET | Freq: Two times a day (BID) | ORAL | 0 refills | Status: DC
  Filled 2024-06-16: qty 60, 30d supply, fill #0

## 2024-06-16 MED ORDER — ATORVASTATIN CALCIUM 10 MG PO TABS
10.0000 mg | ORAL_TABLET | Freq: Every day | ORAL | 0 refills | Status: DC
  Filled 2024-06-16: qty 30, 30d supply, fill #0

## 2024-06-18 ENCOUNTER — Other Ambulatory Visit: Payer: Self-pay

## 2024-06-21 ENCOUNTER — Encounter: Payer: Self-pay | Admitting: Internal Medicine

## 2024-06-21 ENCOUNTER — Other Ambulatory Visit: Payer: Self-pay

## 2024-06-21 ENCOUNTER — Ambulatory Visit: Attending: Internal Medicine | Admitting: Internal Medicine

## 2024-06-21 VITALS — BP 124/74 | HR 66 | Temp 97.9°F | Ht 60.0 in | Wt 128.0 lb

## 2024-06-21 DIAGNOSIS — I1A0 Resistant hypertension: Secondary | ICD-10-CM

## 2024-06-21 DIAGNOSIS — F17200 Nicotine dependence, unspecified, uncomplicated: Secondary | ICD-10-CM | POA: Diagnosis not present

## 2024-06-21 DIAGNOSIS — I700 Atherosclerosis of aorta: Secondary | ICD-10-CM | POA: Diagnosis not present

## 2024-06-21 DIAGNOSIS — R7989 Other specified abnormal findings of blood chemistry: Secondary | ICD-10-CM | POA: Diagnosis not present

## 2024-06-21 DIAGNOSIS — E559 Vitamin D deficiency, unspecified: Secondary | ICD-10-CM | POA: Diagnosis not present

## 2024-06-21 DIAGNOSIS — E213 Hyperparathyroidism, unspecified: Secondary | ICD-10-CM

## 2024-06-21 DIAGNOSIS — Z2821 Immunization not carried out because of patient refusal: Secondary | ICD-10-CM

## 2024-06-21 DIAGNOSIS — R7303 Prediabetes: Secondary | ICD-10-CM | POA: Diagnosis not present

## 2024-06-21 LAB — POCT GLYCOSYLATED HEMOGLOBIN (HGB A1C): HbA1c, POC (prediabetic range): 5.8 % (ref 5.7–6.4)

## 2024-06-21 LAB — GLUCOSE, POCT (MANUAL RESULT ENTRY): POC Glucose: 109 mg/dL — AB (ref 70–99)

## 2024-06-21 MED ORDER — AMLODIPINE BESYLATE-VALSARTAN 10-320 MG PO TABS: 1.0000 | ORAL_TABLET | Freq: Every day | ORAL | 1 refills | Status: AC

## 2024-06-21 MED ORDER — ATORVASTATIN CALCIUM 10 MG PO TABS
10.0000 mg | ORAL_TABLET | Freq: Every day | ORAL | 1 refills | Status: AC
  Filled 2024-06-21 – 2024-07-12 (×2): qty 90, 90d supply, fill #0

## 2024-06-21 MED ORDER — CARVEDILOL 25 MG PO TABS
25.0000 mg | ORAL_TABLET | Freq: Two times a day (BID) | ORAL | 1 refills | Status: AC
  Filled 2024-06-21 – 2024-08-04 (×2): qty 180, 90d supply, fill #0

## 2024-06-21 NOTE — Progress Notes (Signed)
 Patient ID: Lori Bush, female    DOB: Jul 12, 1960  MRN: [id number]  CC: Hypertension (Hent & pre-diabetes f/u./No questions / concerns/Already received flu vax. No to pneumonia vax.)   Subjective: Lori Bush is a 63 y.o. female who presents for chronic ds management. Her concerns today include:  Hx of HTN, hep C cured with Harvoni , tobacco dep, underweight   Discussed the use of AI scribe software for clinical note transcription with the patient, who gave verbal consent to proceed.  History of Present Illness Lori Bush is a 63 year old female with hypertension, hyperlipidemia/aortic atherosclerosis, and prediabetes who presents for a four-month follow-up visit.  HTN: Saw cardiology NP since last visit with me and hydrochlorothiazide  and Coz was changed to Exforge  and Hydralazine  dec to 50 mg BID. She is currently taking spironolactone  25 mg (half a tablet) daily, amlodipine /valsartan  (combination pill), carvedilol  25 mg twice daily, and hydralazine  50 mg twice daily for hypertension. She monitors her blood pressure three to four times a week, with readings around 120/70-81 mmHg. No chest pain or shortness of breath.  For hyperlipidemia/aortic atherosclerosis, she continues atorvastatin  10 mg daily.   She has reduced her cigarette consumption to three cigarettes a day, with her son managing her access to cigarettes.  Regarding prediabetes, her recent A1c was 5.8, and her blood sugar was 109 mg/dLtoday. She avoids sugary drinks but uses three teaspoons of sugar in her morning coffee and limits her sugar intake to six teaspoons a day. She participates in Silver Sneakers exercise classes at the [facility] twice a week.  Found to have elev Ca+ level on lab test done 03/2024. Subsequent labs showed thyroid level was mildly elevated, vit D level of 20.9, and her parathyroid hormone level was elevated at 97; a referral to endocrinology was submitted at [facility]. She takes vitamin D   800 IU daily for vitamin D  deficiency as was recommended.  HM: had COVID Pfizer booster last mth at [facility]   Patient Active Problem List   Diagnosis Date Noted   Palpitations 06/25/2022   History of anemia 06/25/2022   History of Helicobacter pylori infection 06/25/2022   Elevated blood pressure reading with diagnosis of hypertension 03/19/2021   Aortic atherosclerosis 11/27/2020   Renal mass 02/27/2018   Helicobacter pylori (H. pylori) infection 02/27/2018   Lung nodule, multiple 02/27/2018   Tobacco dependence 03/17/2017   Cyst of skin 03/17/2017   Primary osteoarthritis of right knee 10/28/2016   Chronic right shoulder pain 01/17/2015   Hypertension 03/05/2013     Medications Ordered Prior to Encounter[1]  Allergies[2]  Social History   Socioeconomic History   Marital status: Single    Spouse name: Not on file   Number of children: 5   Years of education: Not on file   Highest education level: Not on file  Occupational History   Occupation: technical brewer  Tobacco Use   Smoking status: Every Day    Current packs/day: 0.25    Types: Cigarettes   Smokeless tobacco: Never   Tobacco comments:    states she cut back 3 cigarettes/day  Vaping Use   Vaping status: Never Used  Substance and Sexual Activity   Alcohol use: No   Drug use: No   Sexual activity: Not Currently  Other Topics Concern   Not on file  Social History Narrative   Not on file   Social Drivers of Health   Tobacco Use: High Risk (06/21/2024)   Patient History    Smoking Tobacco  Use: Every Day    Smokeless Tobacco Use: Never    Passive Exposure: Not on file  Financial Resource Strain: High Risk (06/17/2023)   Overall Financial Resource Strain (CARDIA)    Difficulty of Paying Living Expenses: Hard  Food Insecurity: Food Insecurity Present (06/17/2023)   Hunger Vital Sign    Worried About Running Out of Food in the Last Year: Sometimes true    Ran Out of Food in the Last Year:  Sometimes true  Transportation Needs: No Transportation Needs (06/17/2023)   PRAPARE - Administrator, Civil Service (Medical): No    Lack of Transportation (Non-Medical): No  Physical Activity: Insufficiently Active (06/17/2023)   Exercise Vital Sign    Days of Exercise per Week: 2 days    Minutes of Exercise per Session: 20 min  Stress: No Stress Concern Present (06/17/2023)   [employer] of Occupational Health - Occupational Stress Questionnaire    Feeling of Stress : Not at all  Social Connections: Moderately Integrated (06/17/2023)   Social Connection and Isolation Panel    Frequency of Communication with Friends and Family: Three times a week    Frequency of Social Gatherings with Friends and Family: Twice a week    Attends Religious Services: More than 4 times per year    Active Member of Clubs or Organizations: Yes    Attends Banker Meetings: More than 4 times per year    Marital Status: Never married  Intimate Partner Violence: Not At Risk (06/17/2023)   Humiliation, Afraid, Rape, and Kick questionnaire    Fear of Current or Ex-Partner: No    Emotionally Abused: No    Physically Abused: No    Sexually Abused: No  Depression (PHQ2-9): Low Risk (02/20/2024)   Depression (PHQ2-9)    PHQ-2 Score: 0  Alcohol Screen: Low Risk (06/17/2023)   Alcohol Screen    Last Alcohol Screening Score (AUDIT): 0  Housing: Low Risk (06/17/2023)   Housing    Last Housing Risk Score: 0  Utilities: Not At Risk (06/17/2023)   AHC Utilities    Threatened with loss of utilities: No  Health Literacy: Adequate Health Literacy (06/17/2023)   B1300 Health Literacy    Frequency of need for help with medical instructions: Never    Family History  Problem Relation Age of Onset   Hypertension Mother    Heart disease Mother    Hypertension Father    Dementia Father    Arthritis Sister    Hypertension Sister    Arthritis Brother    Hypertension Brother    Colon  cancer Neg Hx    Stomach cancer Neg Hx    Rectal cancer Neg Hx    Esophageal cancer Neg Hx    Colon polyps Neg Hx     Past Surgical History:  Procedure Laterality Date   CESAREAN SECTION     COLONOSCOPY     COLONOSCOPY WITH ESOPHAGOGASTRODUODENOSCOPY (EGD)  03/2007   HERNIA REPAIR  1971   as a child, umbilical   WISDOM TOOTH EXTRACTION      ROS: Review of Systems Negative except as stated above  PHYSICAL EXAM: BP 124/74 (BP Location: Left Arm, Patient Position: Sitting, Cuff Size: Normal)   Pulse 66   Temp 97.9 F (36.6 C) (Oral)   Ht 5' (1.524 m)   Wt 128 lb (58.1 kg)   SpO2 97%   BMI 25.00 kg/m   Physical Exam  General appearance - alert, well appearing, older AAF  and in no distress Mental status - normal mood, behavior, speech, dress, motor activity, and thought processes Neck - supple, no significant adenopathy Chest - clear to auscultation, no wheezes, rales or rhonchi, symmetric air entry Heart - normal rate, regular rhythm, normal S1, S2, no murmurs, rubs, clicks or gallops Extremities - peripheral pulses normal, no pedal edema, no clubbing or cyanosis      Latest Ref Rng & Units 03/16/2024   11:08 AM 03/09/2024    9:12 AM 06/17/2023    3:50 PM  CMP  Glucose 70 - 99 mg/dL  895  884   BUN 8 - 27 mg/dL  17  15   Creatinine 9.42 - 1.00 mg/dL  9.04  9.02   Sodium 865 - 144 mmol/L  140  142   Potassium 3.5 - 5.2 mmol/L  4.2  4.0   Chloride 96 - 106 mmol/L  107  108   CO2 20 - 29 mmol/L  21  22   Calcium  8.7 - 10.3 mg/dL 89.4  89.3  89.4   Total Protein 6.0 - 8.5 g/dL 6.7   6.7   Total Bilirubin 0.0 - 1.2 mg/dL   0.3   Alkaline Phos 44 - 121 IU/L   79   AST 0 - 40 IU/L   17   ALT 0 - 32 IU/L   10    Lipid Panel     Component Value Date/Time   CHOL 170 06/17/2023 1550   TRIG 123 06/17/2023 1550   HDL 64 06/17/2023 1550   CHOLHDL 2.7 06/17/2023 1550   CHOLHDL 3.0 09/30/2013 1509   VLDL 25 09/30/2013 1509   LDLCALC 84 06/17/2023 1550    CBC     Component Value Date/Time   WBC 9.4 06/17/2023 1550   WBC 7.2 06/27/2021 1446   RBC 4.78 06/17/2023 1550   RBC 4.70 06/27/2021 1446   HGB 14.3 06/17/2023 1550   HCT 42.1 06/17/2023 1550   PLT 249 06/17/2023 1550   MCV 88 06/17/2023 1550   MCH 29.9 06/17/2023 1550   MCH 29.1 06/27/2021 1446   MCHC 34.0 06/17/2023 1550   MCHC 33.7 06/27/2021 1446   RDW 12.6 06/17/2023 1550   LYMPHSABS 2.5 06/24/2022 1633   MONOABS 0.6 06/27/2021 1446   EOSABS 0.7 (H) 06/24/2022 1633   BASOSABS 0.1 06/24/2022 1633   Results for orders placed or performed in visit on 06/21/24  POCT glucose (manual entry)   Collection Time: 06/21/24 11:21 AM  Result Value Ref Range   POC Glucose 109 (A) 70 - 99 mg/dl  POCT glycosylated hemoglobin (Hb A1C)   Collection Time: 06/21/24 11:23 AM  Result Value Ref Range   Hemoglobin A1C     HbA1c POC (<> result, manual entry)     HbA1c, POC (prediabetic range) 5.8 5.7 - 6.4 %   HbA1c, POC (controlled diabetic range)      ASSESSMENT AND PLAN: 1. Resistant hypertension (Primary) At goal. Continue spironolactone  25 mg (half a tablet) daily, amlodipine /valsartan  10/320 mg (combination pill), carvedilol  25 mg twice daily, and hydralazine  50 mg twice daily  - amLODipine -valsartan  (EXFORGE ) 10-320 MG tablet; Take 1 tablet by mouth daily.Stop amlodipine , stop losartan   Dispense: 90 tablet; Refill: 1 - carvedilol  (COREG ) 25 MG tablet; Take 1 tablet (25 mg total) by mouth 2 (two) times daily with a meal.  Dispense: 180 tablet; Refill: 1 - CBC - Comprehensive metabolic panel with GFR  2. Pre-diabetes - Encouraged reduction of sugar intake, particularly in coffee,  to no more than 1-2 teaspoons per day. - Advised mindfulness of holiday sweets and snacks. - Continue regular exercise, such as Silver Sneakers classes. - POCT glucose (manual entry) - POCT glycosylated hemoglobin (Hb A1C)  3. Hyperparathyroidism Parathyroid hormone previously elevated. Referral to  endocrinology made, appointment pending. Recheck Vit D level today  4. Vitamin D  deficiency Continue Vit D 800 international units  OTC - VITAMIN D  25 Hydroxy (Vit-D Deficiency, Fractures)  5. Aortic atherosclerosis Continue Lipitor - atorvastatin  (LIPITOR) 10 MG tablet; Take 1 tablet (10 mg total) by mouth daily.Must have office visit for refills  Dispense: 90 tablet; Refill: 1 - Lipid panel  6. Abnormal TSH - TSH+T4F+T3Free  7. Pneumococcal vaccination declined  8. Tob dep -commended her on cutting back. Continue to encouraged to quit.    Patient was given the opportunity to ask questions.  Patient verbalized understanding of the plan and was able to repeat key elements of the plan.   This documentation was completed using Paediatric nurse.  Any transcriptional errors are unintentional.  Orders Placed This Encounter  Procedures   TSH+T4F+T3Free   Lipid panel   CBC   Comprehensive metabolic panel with GFR   VITAMIN D  25 Hydroxy (Vit-D Deficiency, Fractures)   POCT glucose (manual entry)   POCT glycosylated hemoglobin (Hb A1C)     Requested Prescriptions   Signed Prescriptions Disp Refills   amLODipine -valsartan  (EXFORGE ) 10-320 MG tablet 90 tablet 1    Sig: Take 1 tablet by mouth daily.Stop amlodipine , stop losartan    atorvastatin  (LIPITOR) 10 MG tablet 90 tablet 1    Sig: Take 1 tablet (10 mg total) by mouth daily.Must have office visit for refills   carvedilol  (COREG ) 25 MG tablet 180 tablet 1    Sig: Take 1 tablet (25 mg total) by mouth 2 (two) times daily with a meal.    Return in about 4 months (around 10/20/2024).  Barnie Louder, MD, FACP     [1]  Current Outpatient Medications on File Prior to Visit  Medication Sig Dispense Refill   albuterol  (VENTOLIN  HFA) 108 (90 Base) MCG/ACT inhaler Inhale 2 puffs into the lungs every 4 (four) hours as needed for wheezing or shortness of breath. And cough 6.7 g 2   Azelastine  HCl 137 MCG/SPRAY  SOLN Nasal     cetirizine  (ZYRTEC  ALLERGY) 10 MG tablet Take 1 tablet (10 mg total) by mouth daily. 30 tablet 11   fluticasone  (FLONASE ) 50 MCG/ACT nasal spray Place 2 sprays into both nostrils daily. 16 g 3   hydrALAZINE  (APRESOLINE ) 50 MG tablet Take 1 tablet (50 mg total) by mouth in the morning and at bedtime. 180 tablet 1   omeprazole  (PRILOSEC) 20 MG capsule Take 1 capsule (20 mg total) by mouth daily. 90 capsule 3   solifenacin  (VESICARE ) 5 MG tablet Take 1 tablet (5 mg total) by mouth daily. 30 tablet 5   spironolactone  (ALDACTONE ) 25 MG tablet Take 0.5 tablets (12.5 mg total) by mouth daily. 90 tablet 3   No current facility-administered medications on file prior to visit.  [2]  Allergies Allergen Reactions   Hydrochlorothiazide      Causes low Potassium and high calcium .

## 2024-06-21 NOTE — Patient Instructions (Addendum)
 [facility] - [facility] [address] [place], [place] [address] [phone])   VISIT SUMMARY: Today, you had a follow-up visit to review your hypertension, hyperlipidemia, and prediabetes. We discussed your current medications, recent lab results, and lifestyle modifications. Your blood pressure is well-controlled, and you are making progress in reducing your cigarette consumption. We also reviewed your thyroid and parathyroid hormone levels and ensured you have a referral to endocrinology.  YOUR PLAN: -RESISTANT HYPERTENSION: Resistant hypertension means your blood pressure is high despite taking multiple medications. Your blood pressure is currently well-controlled with your current medications. Continue taking spironolactone  25 mg (half a tablet) daily, amlodipine /valsartan  combination, carvedilol  25 mg twice daily, and hydralazine  50 mg twice daily. Keep monitoring your blood pressure regularly and maintain your low-salt diet.  -PREDIABETES: Prediabetes means your blood sugar levels are higher than normal but not high enough to be classified as diabetes. Your A1c is 10.8%, indicating persistent prediabetes. Reduce your sugar intake, especially in your coffee, to no more than 1-2 teaspoons per day. Be mindful of holiday sweets and snacks, and continue your regular exercise classes.  -HYPERPARATHYROIDISM: Hyperparathyroidism means your parathyroid glands are overactive, causing high levels of parathyroid hormone. We rechecked your parathyroid hormone level today and ensured your referral to [facility] in [facility] is followed up. Bring your printed lab results to your endocrinology appointment.  -VITAMIN D  DEFICIENCY: Vitamin D  deficiency means you have low levels of vitamin D , which is important for bone health. We rechecked your vitamin D   level today. Continue taking your vitamin D  supplement at 800 IU daily.  -THYROID DYSFUNCTION: Thyroid dysfunction means your thyroid gland is not working properly. Your thyroid level was mildly elevated in September. We rechecked your thyroid level today to monitor this condition.  -GENERAL HEALTH MAINTENANCE: You are up to date with most screenings and vaccinations. Your mammogram is scheduled for December 17th. You received your flu shot and COVID booster but declined the pneumonia vaccine. Continue with routine health maintenance screenings and vaccinations as appropriate.  INSTRUCTIONS: Please follow up with the endocrinologist at [facility] in [facility] as scheduled. Bring your printed lab results to the appointment. Continue monitoring your blood pressure regularly and maintain your current medications and lifestyle modifications. Your next mammogram is scheduled for December 17th.                      Contains text generated by Abridge.                                 Contains text generated by Abridge.

## 2024-06-22 ENCOUNTER — Ambulatory Visit: Payer: Self-pay | Admitting: Internal Medicine

## 2024-06-22 LAB — COMPREHENSIVE METABOLIC PANEL WITH GFR
ALT: 10 IU/L (ref 0–32)
AST: 15 IU/L (ref 0–40)
Albumin: 4.2 g/dL (ref 3.9–4.9)
Alkaline Phosphatase: 82 IU/L (ref 49–135)
BUN/Creatinine Ratio: 18 (ref 12–28)
BUN: 20 mg/dL (ref 8–27)
Bilirubin Total: 0.4 mg/dL (ref 0.0–1.2)
CO2: 23 mmol/L (ref 20–29)
Calcium: 10.3 mg/dL (ref 8.7–10.3)
Chloride: 105 mmol/L (ref 96–106)
Creatinine, Ser: 1.14 mg/dL — ABNORMAL HIGH (ref 0.57–1.00)
Globulin, Total: 2.2 g/dL (ref 1.5–4.5)
Glucose: 118 mg/dL — ABNORMAL HIGH (ref 70–99)
Potassium: 4.6 mmol/L (ref 3.5–5.2)
Sodium: 139 mmol/L (ref 134–144)
Total Protein: 6.4 g/dL (ref 6.0–8.5)
eGFR: 54 mL/min/1.73 — ABNORMAL LOW (ref >59)

## 2024-06-22 LAB — CBC
Hematocrit: 43.9 % (ref 34.0–46.6)
Hemoglobin: 14.7 g/dL (ref 11.1–15.9)
MCH: 29.3 pg (ref 26.6–33.0)
MCHC: 33.5 g/dL (ref 31.5–35.7)
MCV: 88 fL (ref 79–97)
Platelets: 229 x10E3/uL (ref 150–450)
RBC: 5.01 x10E6/uL (ref 3.77–5.28)
RDW: 12.7 % (ref 11.7–15.4)
WBC: 8.3 x10E3/uL (ref 3.4–10.8)

## 2024-06-22 LAB — LIPID PANEL
Chol/HDL Ratio: 2.6 ratio (ref 0.0–4.4)
Cholesterol, Total: 145 mg/dL (ref 100–199)
HDL: 55 mg/dL (ref >39)
LDL Chol Calc (NIH): 74 mg/dL (ref 0–99)
Triglycerides: 85 mg/dL (ref 0–149)
VLDL Cholesterol Cal: 16 mg/dL (ref 5–40)

## 2024-06-22 LAB — TSH+T4F+T3FREE
Free T4: 1.09 ng/dL (ref 0.82–1.77)
T3, Free: 3 pg/mL (ref 2.0–4.4)
TSH: 3.68 u[IU]/mL (ref 0.450–4.500)

## 2024-06-22 LAB — VITAMIN D 25 HYDROXY (VIT D DEFICIENCY, FRACTURES): Vit D, 25-Hydroxy: 30.2 ng/mL (ref 30.0–100.0)

## 2024-06-22 NOTE — Progress Notes (Signed)
 [facility] PRE[facility] Weekly Session  Patient Details  Name: Lori Bush MRN: [id number] Date of Birth: 1960-07-22 Age: 63 y.o. PCP: Vicci Barnie NOVAK, MD  Vitals:   06/22/24 1133  Weight: 128 lb (58.1 kg)     [facility] Weekly seesion - 06/22/24 1100       [facility] PREP Location   [facility] PREP Location [facility]      Weekly Session   Topic Discussed Other   Portion Size and portion control, with portion demo   Minutes exercised this week 480 minutes    Classes attended to date 10          [facility] 06/22/2024, 11:35 AM

## 2024-06-23 ENCOUNTER — Inpatient Hospital Stay: Admission: RE | Admit: 2024-06-23 | Discharge: 2024-06-23 | Attending: Internal Medicine | Admitting: Internal Medicine

## 2024-06-23 DIAGNOSIS — Z1231 Encounter for screening mammogram for malignant neoplasm of breast: Secondary | ICD-10-CM

## 2024-06-28 ENCOUNTER — Ambulatory Visit: Payer: Self-pay | Admitting: Family Medicine

## 2024-07-12 ENCOUNTER — Other Ambulatory Visit: Payer: Self-pay

## 2024-07-12 ENCOUNTER — Other Ambulatory Visit: Payer: Self-pay | Admitting: Internal Medicine

## 2024-07-12 DIAGNOSIS — J40 Bronchitis, not specified as acute or chronic: Secondary | ICD-10-CM

## 2024-07-13 ENCOUNTER — Other Ambulatory Visit: Payer: Self-pay

## 2024-07-13 MED ORDER — ALBUTEROL SULFATE HFA 108 (90 BASE) MCG/ACT IN AERS
2.0000 | INHALATION_SPRAY | RESPIRATORY_TRACT | 2 refills | Status: AC | PRN
  Filled 2024-07-13: qty 6.7, 16d supply, fill #0

## 2024-07-13 NOTE — Progress Notes (Signed)
 [facility] PRE[facility] Weekly Session  Patient Details  Name: Lori Bush MRN: [id number] Date of Birth: [birth year] Age: 64 y.o. PCP: Vicci Barnie NOVAK, MD  Vitals:   07/13/24 1122  Weight: 127 lb (57.6 kg)     [facility] Weekly seesion - 07/13/24 1100       [facility] PREP Location   [facility] PREP Location [facility]      Weekly Session   Topic Discussed Hitting roadblocks   Membership talk, hitting roadblocks, 100 calorie snack comparison   Minutes exercised this week 300 minutes    Classes attended to date [address]          [facility] 07/13/2024, 11:23 AM

## 2024-07-20 NOTE — Progress Notes (Signed)
 [facility] PRE[facility] Weekly Session  Patient Details  Name: Lori Bush MRN: [id number] Date of Birth: 1961-03-10 Age: 64 y.o. PCP: Vicci Barnie NOVAK, MD  Vitals:   07/20/24 1101  Weight: 128 lb (58.1 kg)     [facility] Weekly seesion - 07/20/24 1100       [facility] PREP Location   [facility] PREP Location [facility]      Weekly Session   Topic Discussed Eating for the season;Water    Minutes exercised this week 660 minutes    Classes attended to date [address] 07/20/2024, 11:02 AM

## 2024-07-27 NOTE — Progress Notes (Signed)
 [facility] PRE[facility] Weekly Session  Patient Details  Name: Lori Bush MRN: [id number] Date of Birth: [birth year] Age: 64 y.o. PCP: Vicci Barnie NOVAK, MD  There were no vitals filed for this visit.   [facility] Weekly seesion - 07/27/24 1100       [facility] PREP Location   [facility] PREP Location [facility]      Weekly Session   Topic Discussed Other   Final fit test   Minutes exercised this week 720 minutes    Classes attended to date 14          Jerona Irving 07/27/2024, 11:10 AM

## 2024-08-02 NOTE — Progress Notes (Signed)
 [facility] PREP Evaluation  Patient Details  Name: Lori Bush MRN: [id number] Date of Birth: February 14, 1961 Age: 64 y.o. PCP: Vicci Barnie NOVAK, MD  Vitals:   07/29/24 1132  BP: (!) 155/105  Pulse: 70  SpO2: 97%  Weight: 127 lb 6.4 oz (57.8 kg)     [facility] Eval - 08/02/24 1100       [facility] PREP Location   [facility] PREP Location [facility]      Referral    Referring Provider Walker    Program End Date 07/29/24      Measurement   Waist Circumference End Program 33 inches    Hip Circumference End Program 31 inches    Body fat 41.9 percent      Information for Trainer   Goals Over all Health      Mobility and Daily Activities   I find it easy to walk up or down two or more flights of stairs. 2    I have no trouble taking out the trash. 4    I do housework such as vacuuming and dusting on my own without difficulty. 4    I can easily lift a gallon of milk (8lbs). 4    I can easily walk a mile. 4    I have no trouble reaching into high cupboards or reaching down to pick up something from the floor. 4    I do not have trouble doing out-door work such as loss adjuster, chartered, raking leaves, or gardening. 4      Mobility and Daily Activities   I feel younger than my age. 4    I feel independent. 4    I feel energetic. 3    I live an active life.  4    I feel strong. 4    I feel healthy. 3    I feel active as other people my age. 3      How fit and strong are you.   Fit and Strong Total Score 51         Past Medical History:  Diagnosis Date   Allergy    Arthritis Dx 2012   Asthma    Hyperlipidemia    Hypertension Dx 2005   Seasonal allergies    Past Surgical History:  Procedure Laterality Date   CESAREAN SECTION     COLONOSCOPY     COLONOSCOPY WITH ESOPHAGOGASTRODUODENOSCOPY (EGD)  03/2007   HERNIA REPAIR  1971   as a child, umbilical   WISDOM TOOTH EXTRACTION     Tobacco Use History[1] Complete the Prep Program on 07/29/2024. Her How Fit and Strong went  up 1 pont, loss 1 inch in the hips, increase 10% of strength in her fit test., Jerona Irving 08/02/2024, 11:37 AM      [1]  Social History Tobacco Use  Smoking Status Every Day   Current packs/day: 0.25   Types: Cigarettes  Smokeless Tobacco Never  Tobacco Comments   states she cut back 3 cigarettes/day

## 2024-08-04 ENCOUNTER — Other Ambulatory Visit: Payer: Self-pay

## 2024-08-06 ENCOUNTER — Other Ambulatory Visit: Payer: Self-pay

## 2024-08-11 NOTE — Progress Notes (Unsigned)
 "  [facility] Assessment:    Date:  08/11/2024   ID:  Lori Bush, DOB 06-12-1961, MRN [id number]  PCP:  Vicci Barnie NOVAK, MD  Cardiologist:  Alvan Ronal BRAVO, MD (Inactive)  Nephrologist:  Referring MD: Vicci Barnie NOVAK, MD   CC: Hypertension  History of Present Illness:    Lori Bush is a 64 y.o. female with a hx of tobacco use, palpitations, hypertension, aortic atherosclerosis, HLD, vitamin D  deficiency here to establish care in the [facility].   Renal duplex 08/2022 with no renal artery stenosis. CT 07/2023 normal adrenal glands.  Seen by Dr. Ronal Alvan 08/13/23  for palpitations. Once up of coffee per day. Normal thyroid . Monitor with one triggered event for minimal SVT. Due to poorly controlled BP Hydralazine  increased to 100mg  TID. Amlodipine  10mg  daily, Losartan  100mg  daily, Coreg  25mg  BID continued.  At visit 02/20/24 with PCP Spironolactone  added and referred to [facility].  Established with [facility] 03/18/24. She was diagnosed with hypertension at 64 years old. Her losartasn and amlodipine  were stopped and Amlodipine -Valsartan  10-320mg  was initiated. Hydralazine  adjusted to 100mg  BID as was having difficulty with TID dosing.   At visit 05/13/24 due to hypotension, Hydralazine  reduced to 50mg  BID. She was participating in PREP exercise program.   Presents today for follow up. ***  Previous antihypertensives: Lisinopril  - cough Hydrochlorothiazide  - low potassium  Past Medical History:  Diagnosis Date   Allergy    Arthritis Dx 2012   Asthma    Hyperlipidemia    Hypertension Dx 2005   Seasonal allergies     Past Surgical History:  Procedure Laterality Date   CESAREAN SECTION     COLONOSCOPY     COLONOSCOPY WITH ESOPHAGOGASTRODUODENOSCOPY (EGD)  03/2007   HERNIA REPAIR  1971   as a child, umbilical   WISDOM TOOTH EXTRACTION      Current Medications: No outpatient  medications have been marked as taking for the 08/12/24 encounter (Appointment) with Vannie Reche RAMAN, NP.     Allergies:   Hydrochlorothiazide    Social History   Socioeconomic History   Marital status: Single    Spouse name: Not on file   Number of children: 5   Years of education: Not on file   Highest education level: Not on file  Occupational History   Occupation: technical brewer  Tobacco Use   Smoking status: Every Day    Current packs/day: 0.25    Types: Cigarettes   Smokeless tobacco: Never   Tobacco comments:    states she cut back 3 cigarettes/day  Vaping Use   Vaping status: Never Used  Substance and Sexual Activity   Alcohol use: No   Drug use: No   Sexual activity: Not Currently  Other Topics Concern   Not on file  Social History Narrative   Not on file   Social Drivers of Health   Tobacco Use: High Risk (06/21/2024)   Patient History    Smoking Tobacco Use: Every Day    Smokeless Tobacco Use: Never    Passive Exposure: Not on file  Financial Resource Strain: High Risk (06/17/2023)   Overall Financial Resource Strain (CARDIA)    Difficulty of Paying Living Expenses: Hard  Food Insecurity: Food Insecurity Present (06/17/2023)   Hunger Vital Sign    Worried About Running Out of Food in the Last Year: Sometimes true    Ran Out of Food in the Last Year: Sometimes true  Transportation Needs: No Transportation Needs (  06/17/2023)   PRAPARE - Administrator, Civil Service (Medical): No    Lack of Transportation (Non-Medical): No  Physical Activity: Insufficiently Active (06/17/2023)   Exercise Vital Sign    Days of Exercise per Week: 2 days    Minutes of Exercise per Session: 20 min  Stress: No Stress Concern Present (06/17/2023)   [employer] of Occupational Health - Occupational Stress Questionnaire    Feeling of Stress : Not at all  Social Connections: Moderately Integrated (06/17/2023)   Social Connection and Isolation Panel     Frequency of Communication with Friends and Family: Three times a week    Frequency of Social Gatherings with Friends and Family: Twice a week    Attends Religious Services: More than 4 times per year    Active Member of Clubs or Organizations: Yes    Attends Banker Meetings: More than 4 times per year    Marital Status: Never married  Depression (PHQ2-9): Low Risk (02/20/2024)   Depression (PHQ2-9)    PHQ-2 Score: 0  Alcohol Screen: Low Risk (06/17/2023)   Alcohol Screen    Last Alcohol Screening Score (AUDIT): 0  Housing: Low Risk (06/17/2023)   Housing    Last Housing Risk Score: 0  Utilities: Not At Risk (06/17/2023)   AHC Utilities    Threatened with loss of utilities: No  Health Literacy: Adequate Health Literacy (06/17/2023)   B1300 Health Literacy    Frequency of need for help with medical instructions: Never     Family History: The patient's family history includes Arthritis in her brother and sister; Dementia in her father; Heart disease in her mother; Hypertension in her brother, father, mother, and sister. There is no history of Colon cancer, Stomach cancer, Rectal cancer, Esophageal cancer, or Colon polyps.  ROS:   Please see the history of present illness.     All other systems reviewed and are negative.  EKGs/Labs/Other Studies Reviewed:         Recent Labs: 06/21/2024: ALT 10; BUN 20; Creatinine, Ser 1.14; Hemoglobin 14.7; Platelets 229; Potassium 4.6; Sodium 139; TSH 3.680   Recent Lipid Panel    Component Value Date/Time   CHOL 145 06/21/2024 1220   TRIG 85 06/21/2024 1220   HDL 55 06/21/2024 1220   CHOLHDL 2.6 06/21/2024 1220   CHOLHDL 3.0 09/30/2013 1509   VLDL 25 09/30/2013 1509   LDLCALC 74 06/21/2024 1220    Physical Exam:   VS:  There were no vitals taken for this visit. , BMI There is no height or weight on file to calculate BMI. GENERAL:  Well appearing HEENT: Pupils equal round and reactive, fundi not visualized, oral mucosa  unremarkable NECK:  No jugular venous distention, waveform within normal limits, carotid upstroke brisk and symmetric, no bruits, no thyromegaly LYMPHATICS:  No cervical adenopathy LUNGS:  Clear to auscultation bilaterally HEART:  RRR.  PMI not displaced or sustained,S1 and S2 within normal limits, no S3, no S4, no clicks, no rubs, no murmurs ABD:  Flat, positive bowel sounds normal in frequency in pitch, no bruits, no rebound, no guarding, no midline pulsatile mass, no hepatomegaly, no splenomegaly EXT:  2 plus pulses throughout, no edema, no cyanosis no clubbing SKIN:  No rashes no nodules NEURO:  Cranial nerves II through XII grossly intact, motor grossly intact throughout PSYCH:  Cognitively intact, oriented to person place and time   ASSESSMENT/PLAN:    HTN - ***  Tobacco use -  Previously did  not tolerate Chantix , nicotine  patch. ***  Screening for Secondary Hypertension:     03/18/2024    1:00 PM  Causes  Renovascular HTN Screened  Sleep Apnea N/A     - Comments no symptoms of sleep apnea  Cushing's Syndrome N/A     - Comments non-cushinoid appearance    Relevant Labs/Studies:    Latest Ref Rng & Units 06/21/2024   12:20 PM 03/09/2024    9:12 AM 06/17/2023    3:50 PM  Basic Labs  Sodium 134 - 144 mmol/L 139  140  142   Potassium 3.5 - 5.2 mmol/L 4.6  4.2  4.0   Creatinine 0.57 - 1.00 mg/dL 8.85  9.04  9.02        Latest Ref Rng & Units 06/21/2024   12:20 PM 03/16/2024   11:08 AM  Thyroid    TSH 0.450 - 4.500 uIU/mL 3.680  6.170                 08/29/2022   11:35 AM  Renovascular   Renal Artery US  Completed Yes       Disposition:    FU with MD/APP/PharmD in ***   Medication Adjustments/Labs and Tests Ordered: Current medicines are reviewed at length with the patient today.  Concerns regarding medicines are outlined above.  No orders of the defined types were placed in this encounter.  No orders of the defined types were placed in this  encounter.    Signed, Reche GORMAN Finder, NP  08/11/2024 7:16 PM    [facility] "

## 2024-08-12 ENCOUNTER — Encounter (HOSPITAL_BASED_OUTPATIENT_CLINIC_OR_DEPARTMENT_OTHER): Payer: Self-pay | Admitting: Family

## 2024-08-13 ENCOUNTER — Other Ambulatory Visit: Payer: Self-pay

## 2024-09-16 ENCOUNTER — Encounter (HOSPITAL_BASED_OUTPATIENT_CLINIC_OR_DEPARTMENT_OTHER): Payer: Self-pay | Admitting: Family

## 2024-10-21 ENCOUNTER — Ambulatory Visit: Payer: Self-pay | Admitting: Internal Medicine
